# Patient Record
Sex: Female | Born: 1960 | Race: Black or African American | Hispanic: No | Marital: Married | Smoking: Never smoker
Health system: Southern US, Community
[De-identification: ages and names within clinical notes are randomized; demographics above are authoritative.]

## PROBLEM LIST (undated history)

## (undated) DIAGNOSIS — R112 Nausea with vomiting, unspecified: Secondary | ICD-10-CM

## (undated) DIAGNOSIS — I10 Essential (primary) hypertension: Secondary | ICD-10-CM

## (undated) DIAGNOSIS — E669 Obesity, unspecified: Secondary | ICD-10-CM

## (undated) DIAGNOSIS — T4145XA Adverse effect of unspecified anesthetic, initial encounter: Secondary | ICD-10-CM

## (undated) DIAGNOSIS — E119 Type 2 diabetes mellitus without complications: Secondary | ICD-10-CM

## (undated) DIAGNOSIS — T8859XA Other complications of anesthesia, initial encounter: Secondary | ICD-10-CM

## (undated) DIAGNOSIS — E079 Disorder of thyroid, unspecified: Secondary | ICD-10-CM

## (undated) DIAGNOSIS — Z9889 Other specified postprocedural states: Secondary | ICD-10-CM

## (undated) HISTORY — PX: BREAST REDUCTION SURGERY: SHX8

## (undated) HISTORY — DX: Obesity, unspecified: E66.9

## (undated) HISTORY — PX: REDUCTION MAMMAPLASTY: SUR839

## (undated) HISTORY — DX: Disorder of thyroid, unspecified: E07.9

## (undated) HISTORY — DX: Essential (primary) hypertension: I10

---
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
## 2000-09-21 ENCOUNTER — Ambulatory Visit (HOSPITAL_COMMUNITY): Admission: RE | Admit: 2000-09-21 | Discharge: 2000-09-21 | Payer: Self-pay | Admitting: Gastroenterology

## 2003-12-20 ENCOUNTER — Other Ambulatory Visit: Admission: RE | Admit: 2003-12-20 | Discharge: 2003-12-20 | Payer: Self-pay | Admitting: Family Medicine

## 2005-03-25 ENCOUNTER — Other Ambulatory Visit: Admission: RE | Admit: 2005-03-25 | Discharge: 2005-03-25 | Payer: Self-pay | Admitting: *Deleted

## 2007-03-11 ENCOUNTER — Other Ambulatory Visit: Admission: RE | Admit: 2007-03-11 | Discharge: 2007-03-11 | Payer: Self-pay | Admitting: Family Medicine

## 2009-05-24 ENCOUNTER — Encounter: Admission: RE | Admit: 2009-05-24 | Discharge: 2009-05-24 | Payer: Self-pay | Admitting: Family Medicine

## 2009-05-28 ENCOUNTER — Other Ambulatory Visit: Admission: RE | Admit: 2009-05-28 | Discharge: 2009-05-28 | Payer: Self-pay | Admitting: Family Medicine

## 2010-06-18 ENCOUNTER — Other Ambulatory Visit: Payer: Self-pay | Admitting: Family Medicine

## 2010-06-18 DIAGNOSIS — Z1231 Encounter for screening mammogram for malignant neoplasm of breast: Secondary | ICD-10-CM

## 2010-06-18 DIAGNOSIS — Z1239 Encounter for other screening for malignant neoplasm of breast: Secondary | ICD-10-CM

## 2010-06-19 ENCOUNTER — Encounter: Payer: Self-pay | Admitting: Family Medicine

## 2010-06-26 ENCOUNTER — Ambulatory Visit
Admission: RE | Admit: 2010-06-26 | Discharge: 2010-06-26 | Disposition: A | Discharge status: Home / Self Care | Payer: BC Managed Care – PPO | Source: Ambulatory Visit | Attending: Family Medicine | Admitting: Family Medicine

## 2010-06-26 DIAGNOSIS — Z1231 Encounter for screening mammogram for malignant neoplasm of breast: Secondary | ICD-10-CM

## 2010-10-04 NOTE — Procedures (Signed)
[facility]  Patient:    Andrea Delacruz, Andrea Delacruz              MRN: [id number] Proc. Date: 09/21/00 Adm. Date:  [id number] Attending:  Orland Mustard CC:         Chales Salmon. Abigail Miyamoto, M.D.   Procedure Report  PROCEDURE:  Colonoscopy.  MEDICATIONS:  Fentanyl 125 mcg, Versed 9 mg IV.  SCOPE:  Pediatric Olympus video colonoscope.  INDICATIONS:  A strong family history of colon cancer and increasing constipation.  DESCRIPTION OF PROCEDURE:  The procedure had been explained to the patient and consent obtained.  With the patient in the left lateral decubitus, the adult Olympus pediatric video colonoscope was inserted following a digital exam and advanced under direct visualization.  The prep was excellent.  Using abdominal pressure and position changes we were able to advance to the cecum.  The ileocecal valve was identified.  The right lower quadrant was transilluminated.  The scope was withdrawn, and cecum, ascending colon, hepatic flexure, transverse colon, splenic flexure, and descending colon were seen well.  There was no significant diverticular disease.  No polyps were seen.  The scope was withdrawn, and the patient tolerated the procedure well and was maintained on low-flow oxygen and pulse oximeter throughout the procedure with no obvious problem.  ASSESSMENT:  No evidence of colon polyps in this woman with a strong family history of colon cancer.  PLAN:  We will repeating the procedure in five years.  We will continue with Metamucil and Miralax for her constipation and see back in the office in three months. DD:  09/21/00 TD:  09/19/00 Job: [id number]

## 2011-05-26 ENCOUNTER — Other Ambulatory Visit: Payer: Self-pay | Admitting: Family Medicine

## 2011-05-26 DIAGNOSIS — Z1231 Encounter for screening mammogram for malignant neoplasm of breast: Secondary | ICD-10-CM

## 2011-06-19 ENCOUNTER — Other Ambulatory Visit (HOSPITAL_COMMUNITY)
Admission: RE | Admit: 2011-06-19 | Discharge: 2011-06-19 | Disposition: A | Discharge status: Home / Self Care | Payer: BC Managed Care – PPO | Source: Ambulatory Visit | Attending: Family Medicine | Admitting: Family Medicine

## 2011-06-19 ENCOUNTER — Other Ambulatory Visit: Payer: Self-pay | Admitting: Family Medicine

## 2011-06-19 DIAGNOSIS — Z01419 Encounter for gynecological examination (general) (routine) without abnormal findings: Secondary | ICD-10-CM | POA: Insufficient documentation

## 2011-07-07 ENCOUNTER — Ambulatory Visit
Admission: RE | Admit: 2011-07-07 | Discharge: 2011-07-07 | Disposition: A | Discharge status: Home / Self Care | Payer: BC Managed Care – PPO | Source: Ambulatory Visit | Attending: Family Medicine | Admitting: Family Medicine

## 2011-07-07 ENCOUNTER — Other Ambulatory Visit: Payer: Self-pay | Admitting: Family Medicine

## 2011-07-07 DIAGNOSIS — Z1231 Encounter for screening mammogram for malignant neoplasm of breast: Secondary | ICD-10-CM

## 2012-09-28 ENCOUNTER — Ambulatory Visit (INDEPENDENT_AMBULATORY_CARE_PROVIDER_SITE_OTHER): Payer: BC Managed Care – PPO | Admitting: Family Medicine

## 2012-09-28 ENCOUNTER — Encounter: Payer: Self-pay | Admitting: Family Medicine

## 2012-09-28 VITALS — BP 112/77 | HR 67 | Resp 14 | Ht 66.25 in | Wt 215.0 lb

## 2012-09-28 DIAGNOSIS — I10 Essential (primary) hypertension: Secondary | ICD-10-CM

## 2012-09-28 DIAGNOSIS — E669 Obesity, unspecified: Secondary | ICD-10-CM

## 2012-09-28 DIAGNOSIS — Z5181 Encounter for therapeutic drug level monitoring: Secondary | ICD-10-CM

## 2012-09-28 MED ORDER — CYANOCOBALAMIN 1000 MCG/ML IJ SOLN: INTRAMUSCULAR | Status: DC

## 2012-09-28 MED ORDER — CHORIONIC GONADOTROPIN 10000 UNITS IM SOLR: INTRAMUSCULAR | Status: DC

## 2012-09-28 MED ORDER — PHENDIMETRAZINE TARTRATE 35 MG PO TABS: 1.0000 | ORAL_TABLET | Freq: Three times a day (TID) | ORAL | Status: DC

## 2012-09-28 NOTE — Progress Notes (Signed)
  Subjective:    Patient ID: Andrea Delacruz, female    DOB: June 15, 1960, 52 y.o.   MRN: [id number]  HPI Andrea Delacruz is here today to establish care with our practice.  She was referred to [place] by a co-worker Misty Stanley Pegram).  She is interested on learning about our Step By Step Diet & Fitness Program.  She has tried numerous diet programs and has not been successful with them.  She feels that she needs to lose weight to improve her general health.  Review of Systems  Constitutional: Positive for fatigue and unexpected weight change. Negative for activity change and appetite change.  Respiratory: Negative for shortness of breath.   Cardiovascular: Negative for chest pain and palpitations.  Gastrointestinal: Negative.   Genitourinary: Negative.   Neurological: Negative.   Psychiatric/Behavioral: Negative.    Past Medical History  Diagnosis Date  . Hypertension   . Obesity   . Thyroid disease     Hyperactive then was treated with iodine.    Family History  Problem Relation Age of Onset  . Stroke Mother   . Diabetes Mother   . Diabetes Father   . Diabetes Sister   . Hypertension Sister   . Hypertension Maternal Aunt   . Cancer Maternal Uncle   . Diabetes Paternal Aunt   . Diabetes Paternal Uncle   . Cancer Maternal Grandmother    History   Social History Narrative   Marital Status:  Married Press photographer)    Children:  G2 Daughter (01) Recruitment consultant) Twins (Adam and Antiyonne)   Pets: Cat (01)    Living Situation: Lives with husband    Occupation:  School Principal -  Presenter, broadcasting    Education:  [employer] in Education    Tobacco Use/Exposure:  None    Alcohol Use:  Occasional   Drug Use:  None   Diet:  Regular   Exercise:  None   Hobbies: Reading              Objective:   Physical Exam  Constitutional: She appears well-nourished. No distress.  HENT:  Head: Normocephalic.  Eyes: No scleral icterus.  Neck: No thyromegaly present.  Cardiovascular: Normal rate, regular rhythm  and normal heart sounds.   Pulmonary/Chest: Effort normal and breath sounds normal.  Abdominal: There is no tenderness.  Musculoskeletal: She exhibits no edema and no tenderness.  Neurological: She is alert.  Skin: Skin is warm and dry.  Psychiatric: She has a normal mood and affect. Her behavior is normal. Judgment and thought content normal.      Assessment & Plan:

## 2012-09-28 NOTE — Patient Instructions (Addendum)
1)  HCG Diet - You will receive an emailed copy of "Pounds & Inches" to [email or web address].     2)  BP - Once you start the 500 calories, cut your BP meds in 1/2.  Make sure that Elease Hashimoto checks your BP next week because we may stop the medication all together.  If your BP gets too low just stop it.

## 2012-09-29 ENCOUNTER — Other Ambulatory Visit: Payer: Self-pay

## 2012-09-29 DIAGNOSIS — Z1231 Encounter for screening mammogram for malignant neoplasm of breast: Secondary | ICD-10-CM

## 2012-10-06 ENCOUNTER — Ambulatory Visit: Payer: BC Managed Care – PPO | Admitting: Family Medicine

## 2012-10-11 DIAGNOSIS — E669 Obesity, unspecified: Secondary | ICD-10-CM | POA: Insufficient documentation

## 2012-10-11 DIAGNOSIS — I10 Essential (primary) hypertension: Secondary | ICD-10-CM | POA: Insufficient documentation

## 2012-10-11 NOTE — Assessment & Plan Note (Signed)
She is to cut her dosage in 1/2 and have the nurse at work check her pressure.  If it is low, she will hold the lisinopril.

## 2012-10-13 ENCOUNTER — Encounter: Payer: Self-pay | Admitting: Family Medicine

## 2012-10-13 ENCOUNTER — Ambulatory Visit (INDEPENDENT_AMBULATORY_CARE_PROVIDER_SITE_OTHER): Payer: BC Managed Care – PPO | Admitting: Family Medicine

## 2012-10-13 DIAGNOSIS — E669 Obesity, unspecified: Secondary | ICD-10-CM

## 2012-10-13 NOTE — Progress Notes (Signed)
  Subjective:    Patient ID: Andrea Delacruz, female    DOB: 1960-09-30, 52 y.o.   MRN: [id number]  HPI  Andrea Delacruz is here today for a follow up of her weight loss. She has just completed her 2nd week of the "Step By Step"  Program.  She is taking Phendimetrazine without any problem. She has also been receiving HCG injections without difficulty.  She has not been following the 500 calorie diet as closely as she needs to.    Review of Systems  Constitutional: Negative for activity change, appetite change, fatigue and unexpected weight change.    Past Medical History  Diagnosis Date  . Hypertension   . Obesity   . Thyroid disease     Hyperactive then was treated with iodine.    Family History  Problem Relation Age of Onset  . Stroke Mother   . Diabetes Mother   . Diabetes Father   . Diabetes Sister   . Hypertension Sister   . Hypertension Maternal Aunt   . Cancer Maternal Uncle   . Diabetes Paternal Aunt   . Diabetes Paternal Uncle   . 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

## 2012-10-19 ENCOUNTER — Ambulatory Visit
Admission: RE | Admit: 2012-10-19 | Discharge: 2012-10-19 | Disposition: A | Discharge status: Home / Self Care | Payer: BC Managed Care – PPO | Source: Ambulatory Visit

## 2012-10-19 DIAGNOSIS — Z1231 Encounter for screening mammogram for malignant neoplasm of breast: Secondary | ICD-10-CM

## 2012-10-21 ENCOUNTER — Ambulatory Visit: Payer: BC Managed Care – PPO | Admitting: *Deleted

## 2012-10-21 VITALS — Wt 204.0 lb

## 2012-10-21 DIAGNOSIS — E669 Obesity, unspecified: Secondary | ICD-10-CM

## 2012-10-28 ENCOUNTER — Ambulatory Visit (INDEPENDENT_AMBULATORY_CARE_PROVIDER_SITE_OTHER): Payer: BC Managed Care – PPO | Admitting: Family Medicine

## 2012-10-28 VITALS — BP 115/75 | HR 70 | Wt 204.0 lb

## 2012-10-28 DIAGNOSIS — I10 Essential (primary) hypertension: Secondary | ICD-10-CM

## 2012-10-28 DIAGNOSIS — E669 Obesity, unspecified: Secondary | ICD-10-CM

## 2012-10-28 MED ORDER — PHENTERMINE HCL 37.5 MG PO TABS: 37.5000 mg | ORAL_TABLET | Freq: Every day | ORAL | Status: DC

## 2012-10-28 NOTE — Patient Instructions (Addendum)
Diet Following Bariatric Surgery The bariatric diet is designed to provide fluids and nourishment while promoting weight loss after bariatric surgery. The diet is divided into 3 stages. The rate of progression varies based on individual food tolerance. DIET FOLLOWING BARIATRIC SURGERY The diet following surgery is divided into 3 stages to allow a gradual adjustment. It is very important to the success of your surgery to:  Progress to each stage slowly.  Eat at set times.  Chew food well and stop eating when you are full.  Not drink liquids 30 minutes before and after meals. If you feel tightness or pressure in your chest, that means you are full. Wait 30 minutes before you try to eat again. STAGE 1 BARIATRIC DIET - ABOUT 2 WEEKS IN DURATION   The diet begins the day of surgery. It will last about 1 to 2 weeks after surgery. Your surgeon may have individual guidelines for you about specific foods or the progression of your diet. Follow your surgeon's guidelines.  If clear liquids are well-tolerated without vomiting, your caregiver will add a 4 oz to 6 oz high protein, low-calorie liquid supplement. You could add this to your meal plan 3 times daily. You will need at least 60 g to 80 g of protein daily or as determined by your Registered Dietitian.  Guidelines for choosing a protein supplement include:  At least 15 g of protein per 8 oz serving.  Less than 20 g total carbohydrate per 8 oz serving.  Less than 5 g fat per 8 oz serving.  Avoid carbonated beverages, caffeine, alcohol, and concentrated sweets such as sugar, cakes, and cookies.  Right after surgery, you may only be able to eat 3 to 4 tsp per meal. Your maximum volume should not exceed  to  cup total. Do not eat or drink more than 1 oz or 2 tbs every 15 minutes.  Take a chewable multivitamin and mineral supplement.  Drink at least 48 oz of fluid daily, which includes your protein supplement. Food and beverages from the  list below are allowed at set times (for example at 8 AM, 12 noon, or 5 PM):  Decaffeinated coffee or tea.  100% fruit juice.  Diet or sugar-free drinks.  Broth.  Blenderized soup.  Skim milk or lactose-free milk.  Sugar-free gelatin dessert or frozen ice pops.  Mashed potatoes.  Yogurt (artificially sweetened).  Sugar-free pudding.  Blended low-fat cottage cheese.  Unsweetened applesauce, grits, or hot wheat cereal. Four to six ounces of a liquid protein supplement from the list below is recommended for snacks at 10 AM, 2 PM, and 8 PM.  STAGE 2 BARIATRIC DIET (SOFT DIET) - ABOUT 4 WEEKS IN DURATION  About 2 weeks after surgery, your caregiver will progress your diet to this stage. Foods may need to be blended to the consistency of applesauce. Choose low-fat foods (less than 5 g of fat per serving) and avoid concentrated sweets and sugar (less than 10 g of sugar per serving). Meals should not exceed  to  cup total. This stage will last about 4 weeks. It is recommended that you meet with your dietitian at this stage to begin preparation for the last stage. This stage consists of 3 meals a day with a liquid protein supplement between meals twice daily. Do not drink liquids with foods. You must wait 30 minutes for the stomach pouch to empty before drinking. Chew food well. The food must be almost liquified before swallowing. Soft foods from the   list below can now be slowly added to your diet:  Soft fruit (soft canned fruit in light syrup or natural juice, banana, melon, peaches, pears, or strawberries).  Cooked vegetables.  Toast or crackers (becomes soft after chewing 20 times).  Hot wheat cereal.  Fish.  Eggs (scrambled, soft-boiled). STAGE 3 BARIATRIC DIET (REGULAR DIET) - ABOUT 6 to 8 WEEKS AFTER SURGERY About 6 to 8 weeks after surgery, you will be advanced to food that is regular in texture. This diet should include all food groups. The diet will continue to promote  weight loss. Meals should not exceed  to 1 cup total. Your dietitian will be available to assist you in meal planning and additional behavioral strategies to make this final stage a long-term success. Slowly add foods of regular consistency and remember:  Eat only at your chosen meal times.  Minimize drinking with meals. You should drink 30 minutes before eating. Do not start drinking again for about 2 hours after eating.  Chew food well. Take small bites.  Think about the portion size of a healthy frozen meal. You will be able to eat most of this.  Make sure your meal is balanced with starch, protein, fruits, and vegetables.  When you feel full, stop eating. Document Released: 11/09/2002 Document Revised: 07/28/2011 Document Reviewed: 08/02/2010 [employer] Patient Information 2014 [employer].  

## 2012-11-06 ENCOUNTER — Encounter: Payer: Self-pay | Admitting: Family Medicine

## 2012-11-06 NOTE — Assessment & Plan Note (Signed)
She is to work harder on following the HCG diet more closely.

## 2012-11-25 NOTE — Progress Notes (Signed)
  Subjective:    Patient ID: Andrea Delacruz, female    DOB: 1961/01/18, 52 y.o.   MRN: [id number]  HPI  Leetta is back for her final weigh in.  She has lost 11 lbs since starting on the HCG diet.  Her clothes are fitting better and she has more energy.  She wants to continue to lose weight.    Review of Systems  Constitutional: Positive for activity change and appetite change. Negative for fatigue and unexpected weight change (11 lb weight loss).  Respiratory: Negative for chest tightness and shortness of breath.   Cardiovascular: Negative for chest pain, palpitations and leg swelling.  Gastrointestinal: Negative for diarrhea and constipation.  Genitourinary: Negative for difficulty urinating.  Musculoskeletal: Negative for arthralgias.  Psychiatric/Behavioral: Negative for dysphoric mood. The patient is not nervous/anxious.        Objective:   Physical Exam  Constitutional: She appears well-nourished. No distress.  HENT:  Head: Normocephalic.  Eyes: No scleral icterus.  Neck: No thyromegaly present.  Cardiovascular: Normal rate, regular rhythm and normal heart sounds.   Pulmonary/Chest: Effort normal and breath sounds normal.  Abdominal: There is no tenderness.  Musculoskeletal: She exhibits no edema and no tenderness.  Neurological: She is alert.  Skin: Skin is warm and dry.  Psychiatric: She has a normal mood and affect. Her behavior is normal. Judgment and thought content normal.          Assessment & Plan:

## 2012-11-25 NOTE — Assessment & Plan Note (Signed)
She was given a prescription for Phentermine for Phase III of the HCG diet.

## 2012-11-25 NOTE — Assessment & Plan Note (Signed)
BP is perfect.

## 2013-03-18 ENCOUNTER — Telehealth: Payer: Self-pay | Admitting: Endocrinology

## 2013-03-18 NOTE — Telephone Encounter (Signed)
Pt says she got a message that she needs to sch appt w/ Dr. Lucianne Muss based on lab results. When does pt need to be seen? Sherri

## 2013-03-18 NOTE — Telephone Encounter (Signed)
ASAP, I think she no showed her last appt

## 2013-03-19 NOTE — Telephone Encounter (Signed)
Pt scheduled to come in on Tuesday 03/22/13. Pt is aware of appt date and time / Sherir S.

## 2013-03-22 ENCOUNTER — Encounter: Payer: Self-pay | Admitting: Endocrinology

## 2013-03-22 ENCOUNTER — Ambulatory Visit (INDEPENDENT_AMBULATORY_CARE_PROVIDER_SITE_OTHER): Payer: BC Managed Care – PPO | Admitting: Endocrinology

## 2013-03-22 VITALS — BP 110/62 | HR 99 | Temp 98.3°F | Resp 12 | Ht 67.0 in | Wt 214.3 lb

## 2013-03-22 DIAGNOSIS — E890 Postprocedural hypothyroidism: Secondary | ICD-10-CM

## 2013-03-22 MED ORDER — SYNTHROID 100 MCG PO TABS
100.0000 ug | ORAL_TABLET | Freq: Every day | ORAL | Status: DC
Start: 2013-03-22 — End: 2013-08-09

## 2013-03-22 NOTE — Progress Notes (Signed)
Andrea Delacruz  Reason for Appointment:  Hypothyroidism, followup visit   History of Present Illness:   The hypothyroidism was first diagnosed  about 20 years ago after her I-131 treatment for hyperthyroidism Complaints are reported by the patient now are some tendency to weight gain and muscle aching She does not complain of any unusual fatigue or cold sensitivity          The treatments that the patient has taken include brand name Synthroid. Over the last couple of years has required lower doses           Her last visit was several months ago and records are not available She does not think her dose was changed on her last visit Compliance with the medical regimen has been as prescribed with taking the tablet in the morning before breakfast.  Not clear why her TSH is relatively low at 0.16 now; this was done about 10 days ago at her PCP office  No results found for any previous visit.    Medication List       This list is accurate as of: 03/22/13 11:59 PM.  Always use your most recent med list.               amoxicillin 500 MG capsule  Commonly known as:  AMOXIL  500 mg.     cyanocobalamin 1000 MCG/ML injection  Commonly known as:  (VITAMIN B-12)  Mix with HCG     fluticasone 50 MCG/ACT nasal spray  Commonly known as:  FLONASE     hydrochlorothiazide 25 MG tablet  Commonly known as:  HYDRODIURIL     lisinopril 10 MG tablet  Commonly known as:  PRINIVIL,ZESTRIL  Take 10 mg by mouth daily.     SYNTHROID 100 MCG tablet  Generic drug:  levothyroxine  Take 1 tablet (100 mcg total) by mouth daily before breakfast.        Allergies: No Known Allergies  Past Medical History  Diagnosis Date  . Hypertension   . Obesity   . Thyroid disease     Hyperactive then was treated with iodine.     Past Surgical History  Procedure Laterality Date  . Breast reduction surgery  [id number]    Family History  Problem Relation Age of Onset  . Stroke Mother   . Diabetes  Mother   . Diabetes Father   . Diabetes Sister   . Hypertension Sister   . Hypertension Maternal Aunt   . Cancer Maternal Uncle   . Diabetes Paternal Aunt   . Diabetes Paternal Uncle   . Cancer Maternal Grandmother     Social History:  reports that she has never smoked. She does not have any smokeless tobacco history on file. She reports that she drinks alcohol. She reports that she does not use illicit drugs.  REVIEW Of SYSTEMS:  History of hypertension No history of diabetes   Examination:   BP 110/62  Pulse 99  Temp(Src) 98.3 F (36.8 C)  Resp 12  Ht 5\' 7"  (1.702 m)  Wt 214 lb 4.8 oz (97.206 kg)  BMI 33.56 kg/m2  SpO2 98%   GENERAL APPEARANCE: Alert And looks well.    FACE: No puffiness of face   Pulse 80         NECK: no thyromegaly.          NEUROLOGIC EXAM: DTRs 2+ at biceps with normal relaxation.    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

## 2013-03-22 NOTE — Patient Instructions (Signed)
Synthroid 100ug daily

## 2013-06-10 ENCOUNTER — Other Ambulatory Visit (INDEPENDENT_AMBULATORY_CARE_PROVIDER_SITE_OTHER): Payer: BC Managed Care – PPO

## 2013-06-10 DIAGNOSIS — E890 Postprocedural hypothyroidism: Secondary | ICD-10-CM

## 2013-06-10 LAB — TSH: TSH: 2.29 u[IU]/mL (ref 0.35–5.50)

## 2013-06-10 LAB — T4, FREE: Free T4: 0.9 ng/dL (ref 0.60–1.60)

## 2013-06-13 ENCOUNTER — Ambulatory Visit (INDEPENDENT_AMBULATORY_CARE_PROVIDER_SITE_OTHER): Payer: BC Managed Care – PPO | Admitting: Endocrinology

## 2013-06-13 ENCOUNTER — Encounter: Payer: Self-pay | Admitting: Endocrinology

## 2013-06-13 VITALS — BP 118/72 | HR 81 | Temp 98.1°F | Resp 14 | Ht 67.0 in | Wt 217.9 lb

## 2013-06-13 DIAGNOSIS — E890 Postprocedural hypothyroidism: Secondary | ICD-10-CM

## 2013-06-13 NOTE — Progress Notes (Signed)
Andrea Delacruz  Reason for Appointment:  Hypothyroidism, followup visit   History of Present Illness:   Her hypothyroidism was first diagnosed  about 20 years ago after her I-131 treatment for hyperthyroidism The therapy that the patient has taken include brand name Synthroid. Over the last couple of years has required lower doses  On her last visit because of a TSH of 0.16 her dose was reduced to 100 mcg    Complaints are reported by the patient now are some tendency to weight gain  She does not complain of any unusual fatigue or cold sensitivity     She still has some muscle aches off and on      Compliance with the medical regimen has been as prescribed with taking the tablet in the morning before breakfast.  Labs:  Appointment on 06/10/2013  Component Date Value Range Status  . Free T4 06/10/2013 0.90  0.60 - 1.60 ng/dL Final  . TSH 16/10/960401/23/2015 2.29  0.35 - 5.50 uIU/mL Final      Medication List       This list is accurate as of: 06/13/13  8:26 AM.  Always use your most recent med list.               hydrochlorothiazide 25 MG tablet  Commonly known as:  HYDRODIURIL     lisinopril 10 MG tablet  Commonly known as:  PRINIVIL,ZESTRIL  Take 10 mg by mouth daily.     SYNTHROID 100 MCG tablet  Generic drug:  levothyroxine  Take 1 tablet (100 mcg total) by mouth daily before breakfast.        Allergies: No Known Allergies  Past Medical History  Diagnosis Date  . Hypertension   . Obesity   . Thyroid disease     Hyperactive then was treated with iodine.     Past Surgical History  Procedure Laterality Date  . Breast reduction surgery  [id number]    Family History  Problem Relation Age of Onset  . Stroke Mother   . Diabetes Mother   . Diabetes Father   . Diabetes Sister   . Hypertension Sister   . Hypertension Maternal Aunt   . Cancer Maternal Uncle   . Diabetes Paternal Aunt   . Diabetes Paternal Uncle   . Cancer Maternal Grandmother     Social  History:  reports that she has never smoked. She does not have any smokeless tobacco history on file. She reports that she drinks alcohol. She reports that she does not use illicit drugs.  REVIEW Of SYSTEMS:  History of hypertension on a 2 drug regimen, followed by PCP No history of palpitations  No history of diabetes   Examination:   BP 118/72  Pulse 81  Temp(Src) 98.1 F (36.7 C)  Resp 14  Ht 5\' 7"  (1.702 m)  Wt 217 lb 14.4 oz (98.839 kg)  BMI 34.12 kg/m2  SpO2 94%   GENERAL APPEARANCE: Alert And looks well.    FACE: No puffiness of  hands   heart rate 78 regular       NEUROLOGIC EXAM: DTRs  somewhat difficult to elicit but appear to have  normal relaxation.    Assessments   Hypothyroidism, post ablative  and long-standing She is requiring relatively low doses of medication for her supplement now However TSH is in the desired range currently with 100 mcg and she can continue this   [facility] HospitalKUMAR,Andrea Delacruz 06/13/2013, 8:26 AM

## 2013-08-09 ENCOUNTER — Other Ambulatory Visit: Payer: Self-pay | Admitting: *Deleted

## 2013-08-09 MED ORDER — SYNTHROID 100 MCG PO TABS: 100.0000 ug | ORAL_TABLET | Freq: Every day | ORAL | Status: DC

## 2013-10-11 ENCOUNTER — Other Ambulatory Visit: Payer: Self-pay

## 2013-10-11 DIAGNOSIS — Z1231 Encounter for screening mammogram for malignant neoplasm of breast: Secondary | ICD-10-CM

## 2013-11-08 ENCOUNTER — Ambulatory Visit
Admission: RE | Admit: 2013-11-08 | Discharge: 2013-11-08 | Disposition: A | Discharge status: Home / Self Care | Payer: BC Managed Care – PPO | Source: Ambulatory Visit

## 2013-11-08 DIAGNOSIS — Z1231 Encounter for screening mammogram for malignant neoplasm of breast: Secondary | ICD-10-CM

## 2013-12-12 ENCOUNTER — Other Ambulatory Visit (INDEPENDENT_AMBULATORY_CARE_PROVIDER_SITE_OTHER): Payer: BC Managed Care – PPO

## 2013-12-12 DIAGNOSIS — E890 Postprocedural hypothyroidism: Secondary | ICD-10-CM

## 2013-12-12 LAB — T4, FREE: Free T4: 2.35 ng/dL — ABNORMAL HIGH (ref 0.60–1.60)

## 2013-12-12 LAB — TSH: TSH: 0.43 u[IU]/mL (ref 0.35–4.50)

## 2013-12-16 ENCOUNTER — Encounter: Payer: Self-pay | Admitting: Endocrinology

## 2013-12-16 ENCOUNTER — Ambulatory Visit (INDEPENDENT_AMBULATORY_CARE_PROVIDER_SITE_OTHER): Payer: BC Managed Care – PPO | Admitting: Endocrinology

## 2013-12-16 VITALS — BP 114/84 | HR 81 | Temp 98.0°F | Resp 16 | Ht 67.0 in | Wt 210.0 lb

## 2013-12-16 DIAGNOSIS — E890 Postprocedural hypothyroidism: Secondary | ICD-10-CM

## 2013-12-16 NOTE — Progress Notes (Signed)
Andrea Delacruz   Reason for Appointment:  Hypothyroidism, followup visit   History of Present Illness:   Her hypothyroidism was first diagnosed  about 20 years ago after her I-131 treatment for hyperthyroidism The therapy that the patient has taken include brand name Synthroid. Over the last couple of years has required lower doses  In 11/14 because of a TSH of 0.16 her dose was reduced to 100 mcg    Complaints are reported by the patient now are none She does not complain of any unusual fatigue or cold sensitivity and she has been able to lose weight    Compliance with the medical regimen has been as prescribed with taking the tablet in the morning before breakfast. Has not taken any new medications or OTC supplements except B vitamins  Labs:  Appointment on 12/12/2013  Component Date Value Ref Range Status  . TSH 12/12/2013 0.43  0.35 - 4.50 uIU/mL Final  . Free T4 12/12/2013 2.35* 0.60 - 1.60 ng/dL Final      Medication List       This list is accurate as of: 12/16/13  8:34 AM.  Always use your most recent med list.               hydrochlorothiazide 25 MG tablet  Commonly known as:  HYDRODIURIL     lisinopril 10 MG tablet  Commonly known as:  PRINIVIL,ZESTRIL  Take 10 mg by mouth daily.     SYNTHROID 100 MCG tablet  Generic drug:  levothyroxine  Take 1 tablet (100 mcg total) by mouth daily before breakfast.        Allergies: No Known Allergies  Past Medical History  Diagnosis Date  . Hypertension   . Obesity   . Thyroid disease     Hyperactive then was treated with iodine.     Past Surgical History  Procedure Laterality Date  . Breast reduction surgery  [id number]    Family History  Problem Relation Age of Onset  . Stroke Mother   . Diabetes Mother   . Diabetes Father   . Diabetes Sister   . Hypertension Sister   . Hypertension Maternal Aunt   . Cancer Maternal Uncle   . Diabetes Paternal Aunt   . Diabetes Paternal Uncle   . Cancer Maternal  Grandmother     Social History:  reports that she has never smoked. She does not have any smokeless tobacco history on file. She reports that she drinks alcohol. She reports that she does not use illicit drugs.  REVIEW Of SYSTEMS:  History of hypertension on a 2 drug regimen, followed by PCP  No history of diabetes   Examination:   BP 114/84  Pulse 81  Temp(Src) 98 F (36.7 C)  Resp 16  Ht 5\' 7"  (1.702 m)  Wt 210 lb (95.255 kg)  BMI 32.88 kg/m2  SpO2 96%   GENERAL APPEARANCE: she  looks well.   No puffiness of  hands        NEUROLOGIC EXAM: DTRs  somewhat difficult to elicit, has normal  relaxation.    Assessment/Plan   Hypothyroidism, post ablative  and long-standing She is requiring relatively low doses of medication for her supplement  TSH is low normal and she can reduce her dose by half tablet a week   Followup in 6 months  Andrea Delacruz 12/16/2013, 8:34 AM

## 2013-12-16 NOTE — Patient Instructions (Signed)
1/2 tab on Sundays and 1 on other days

## 2014-05-04 ENCOUNTER — Other Ambulatory Visit: Payer: Self-pay | Admitting: Endocrinology

## 2014-06-13 ENCOUNTER — Other Ambulatory Visit (INDEPENDENT_AMBULATORY_CARE_PROVIDER_SITE_OTHER): Payer: BC Managed Care – PPO

## 2014-06-13 DIAGNOSIS — E890 Postprocedural hypothyroidism: Secondary | ICD-10-CM

## 2014-06-13 LAB — TSH: TSH: 5.13 u[IU]/mL — AB (ref 0.35–4.50)

## 2014-06-14 ENCOUNTER — Other Ambulatory Visit: Payer: BC Managed Care – PPO

## 2014-06-20 ENCOUNTER — Ambulatory Visit: Payer: BC Managed Care – PPO | Admitting: Endocrinology

## 2014-06-21 ENCOUNTER — Ambulatory Visit (INDEPENDENT_AMBULATORY_CARE_PROVIDER_SITE_OTHER): Payer: BC Managed Care – PPO | Admitting: Endocrinology

## 2014-06-21 ENCOUNTER — Other Ambulatory Visit (HOSPITAL_COMMUNITY)
Admission: RE | Admit: 2014-06-21 | Discharge: 2014-06-21 | Disposition: A | Discharge status: Home / Self Care | Payer: BC Managed Care – PPO | Source: Ambulatory Visit | Attending: Obstetrics & Gynecology | Admitting: Obstetrics & Gynecology

## 2014-06-21 ENCOUNTER — Encounter: Payer: Self-pay | Admitting: Endocrinology

## 2014-06-21 ENCOUNTER — Other Ambulatory Visit: Payer: Self-pay | Admitting: Obstetrics & Gynecology

## 2014-06-21 VITALS — BP 118/76 | HR 100 | Temp 98.4°F | Ht 67.0 in | Wt 220.0 lb

## 2014-06-21 DIAGNOSIS — Z01419 Encounter for gynecological examination (general) (routine) without abnormal findings: Secondary | ICD-10-CM | POA: Insufficient documentation

## 2014-06-21 DIAGNOSIS — E890 Postprocedural hypothyroidism: Secondary | ICD-10-CM

## 2014-06-21 DIAGNOSIS — Z1151 Encounter for screening for human papillomavirus (HPV): Secondary | ICD-10-CM | POA: Insufficient documentation

## 2014-06-21 DIAGNOSIS — R87810 Cervical high risk human papillomavirus (HPV) DNA test positive: Secondary | ICD-10-CM | POA: Diagnosis present

## 2014-06-21 MED ORDER — SYNTHROID 112 MCG PO TABS: 112.0000 ug | ORAL_TABLET | Freq: Every day | ORAL | Status: DC

## 2014-06-21 NOTE — Progress Notes (Signed)
Pre visit review using our clinic review tool, if applicable. No additional management support is needed unless otherwise documented below in the visit note. 

## 2014-06-21 NOTE — Progress Notes (Signed)
Andrea AmisAngella G Heeren 54 y.o.             Reason for Appointment:  Hypothyroidism, followup visit   History of Present Illness:   Her hypothyroidism was first diagnosed  about 20 years ago after her I-131 treatment for hyperthyroidism The therapy that the patient has taken include brand name Synthroid.  In 11/14 because of a TSH of 0.16 her dose was reduced to 100 mcg    Complaints are reported by the patient now are fatigue for about 3 months.  This is more noticeable recently. She does not have any unusual cold sensitivity. She has gained 10 pounds although this is partly from her not exercising because of fatigue. No constipation or hoarseness but she does think she has thinning of her hair    Compliance with the medical regimen has been as prescribed with taking the tablet in the morning before breakfast. She was told to take 6-1/2 tablets a week on her last visit but she is doing conflicting answers of whether she is doing this or not.  In 7/15 and her TSH was low normal at 0.43 It is now mildly increased  Has not taken any new medications or OTC supplements, is on prescription vitamin D2 weekly from PCP  Labs:  Lab Results  Component Value Date   TSH 5.13* 06/13/2014   TSH 0.43 12/12/2013   TSH 2.29 06/10/2013   FREET4 2.35* 12/12/2013   FREET4 0.90 06/10/2013       Medication List       This list is accurate as of: 06/21/14  4:34 PM.  Always use your most recent med list.               hydrochlorothiazide 25 MG tablet  Commonly known as:  HYDRODIURIL     lisinopril 10 MG tablet  Commonly known as:  PRINIVIL,ZESTRIL  Take 10 mg by mouth daily.     SYNTHROID 100 MCG tablet  Generic drug:  levothyroxine  TAKE 1 TABLET (100 MCG TOTAL) BY MOUTH DAILY BEFORE BREAKFAST.        Allergies: No Known Allergies  Past Medical History  Diagnosis Date  . Hypertension   . Obesity   . Thyroid disease     Hyperactive then was treated with iodine.     Past  Surgical History  Procedure Laterality Date  . Breast reduction surgery  [id number]    Family History  Problem Relation Age of Onset  . Stroke Mother   . Diabetes Mother   . Diabetes Father   . Diabetes Sister   . Hypertension Sister   . Hypertension Maternal Aunt   . Cancer Maternal Uncle   . Diabetes Paternal Aunt   . Diabetes Paternal Uncle   . Cancer Maternal Grandmother     Social History:  reports that she has never smoked. She does not have any smokeless tobacco history on file. She reports that she drinks alcohol. She reports that she does not use illicit drugs.  REVIEW Of SYSTEMS:  Wt Readings from Last 3 Encounters:  06/21/14 220 lb (99.791 kg)  12/16/13 210 lb (95.255 kg)  06/13/13 217 lb 14.4 oz (98.839 kg)   History of hypertension on a 2 drug regimen, followed by PCP  No history of diabetes  Muscle aches present.   Examination:   BP 118/76 mmHg  Pulse 100  Temp(Src) 98.4 F (36.9 C) (Oral)  Ht 5\' 7"  (1.702 m)  Wt 220 lb (99.791 kg)  BMI  34.45 kg/m2  SpO2 98%   GENERAL APPEARANCE: she  looks well.   No puffiness of  hands or eyes        NEUROLOGIC EXAM:  biceps reflexes: she does have normal  relaxation. No peripheral edema  Skin looks normal    Assessment/Plan   Hypothyroidism, post ablative  and long-standing. Although patient is not giving inaccurate history about her medication regimen she does need a higher dose not because of TSH of 5.1 and symptoms of fatigue Will simplify her regimen and have her take 112 g daily She'll continue brand name medication Discussed interacting substances to avoid  Followup in 2 months  Mohd. Derflinger 06/21/2014, 4:34 PM

## 2014-06-23 LAB — CYTOLOGY - PAP

## 2014-07-07 ENCOUNTER — Telehealth: Payer: Self-pay | Admitting: Endocrinology

## 2014-07-07 NOTE — Telephone Encounter (Signed)
Labs faxed

## 2014-07-07 NOTE — Telephone Encounter (Signed)
Pt newds last thyroid level fax to Dr. Hall office ASAP fax nuMargo Ayember is [phone]  Thanks Delice Bisonara

## 2014-08-15 ENCOUNTER — Other Ambulatory Visit (INDEPENDENT_AMBULATORY_CARE_PROVIDER_SITE_OTHER): Payer: BC Managed Care – PPO

## 2014-08-15 DIAGNOSIS — E890 Postprocedural hypothyroidism: Secondary | ICD-10-CM | POA: Diagnosis not present

## 2014-08-15 LAB — TSH: TSH: 0.63 u[IU]/mL (ref 0.35–4.50)

## 2014-08-15 LAB — T4, FREE: FREE T4: 1.15 ng/dL (ref 0.60–1.60)

## 2014-08-17 ENCOUNTER — Ambulatory Visit (INDEPENDENT_AMBULATORY_CARE_PROVIDER_SITE_OTHER): Payer: BC Managed Care – PPO | Admitting: Endocrinology

## 2014-08-17 ENCOUNTER — Encounter: Payer: Self-pay | Admitting: Endocrinology

## 2014-08-17 ENCOUNTER — Ambulatory Visit: Payer: BC Managed Care – PPO | Admitting: Endocrinology

## 2014-08-17 VITALS — BP 124/78 | HR 91 | Temp 98.3°F | Resp 16 | Ht 67.0 in | Wt 219.0 lb

## 2014-08-17 DIAGNOSIS — E890 Postprocedural hypothyroidism: Secondary | ICD-10-CM

## 2014-08-17 NOTE — Progress Notes (Signed)
Andrea Delacruz 53 y.o.             Reason for Appointment:  Hypothyroidism, followup visit   History of Present Illness:   Her hypothyroidism was first diagnosed  about 20 years ago after her I-131 treatment for hyperthyroidism The therapy that the patient has taken include brand name Synthroid.  In 11/14 because of a TSH of 0.16 her dose was reduced to 100 mcg  In 7/15 her TSH was low normal at 0.43  but in 1/16 her TSH had increased further to 5.1 At that time she was starting to feel more fatigue for the previous 3 months and also some hair loss  She was switched to 112 g once a day for simplicity and she has been taking this fairly regularly in the morning She thinks her energy level is improved  Has not taken any new medications or OTC supplements, is on prescription vitamin D2 weekly from PCP  Labs:  Lab Results  Component Value Date   TSH 0.63 08/15/2014   TSH 5.13* 06/13/2014   TSH 0.43 12/12/2013   FREET4 1.15 08/15/2014   FREET4 2.35* 12/12/2013   FREET4 0.90 06/10/2013       Medication List       This list is accurate as of: 08/17/14  1:22 PM.  Always use your most recent med list.               hydrochlorothiazide 25 MG tablet  Commonly known as:  HYDRODIURIL     lisinopril 10 MG tablet  Commonly known as:  PRINIVIL,ZESTRIL  Take 10 mg by mouth daily.     SYNTHROID 100 MCG tablet  Generic drug:  levothyroxine     vitamin B-12 1000 MCG tablet  Commonly known as:  CYANOCOBALAMIN  Take 1,000 mcg by mouth daily.        Allergies: No Known Allergies  Past Medical History  Diagnosis Date  . Hypertension   . Obesity   . Thyroid disease     Hyperactive then was treated with iodine.     Past Surgical History  Procedure Laterality Date  . Breast reduction surgery  [id number]    Family History  Problem Relation Age of Onset  . Stroke Mother   . Diabetes Mother   . Diabetes Father   . Diabetes Sister   . Hypertension Sister   .  Hypertension Maternal Aunt   . Cancer Maternal Uncle   . Diabetes Paternal Aunt   . Diabetes Paternal Uncle   . Cancer Maternal Grandmother     Social History:  reports that she has never smoked. She does not have any smokeless tobacco history on file. She reports that she drinks alcohol. She reports that she does not use illicit drugs.  REVIEW Of SYSTEMS:  Weight range:  Wt Readings from Last 3 Encounters:  08/17/14 219 lb (99.338 kg)  06/21/14 220 lb (99.791 kg)  12/16/13 210 lb (95.255 kg)   History of hypertension on a 2 drug regimen, followed by PCP  No history of diabetes  She is taking treatments from dermatologist for hair loss   Examination:   BP 124/78 mmHg  Pulse 91  Temp(Src) 98.3 F (36.8 C)  Resp 16  Ht  (1.702 m)  Wt 219 lb (99.338 kg)  BMI 34.29 kg/m2  SpO2 98%  She looks well.   No puffiness of  hands or eyes    biceps reflexes: These have normal  relaxation. No  peripheral edema  Skin looks normal    Assessment/Plan   Hypothyroidism, post ablative  and long-standing. Recently she has been feeling better with increasing her dose slightly Objectively looks euthyroid  Now her TSH is quite normal with taking 112 g of brand name Synthroid daily. She is fairly compliant with her medication and taking it in the morning as directed She will follow-up in another 6 months to ensure stability  Hair loss: May not be related to hypothyroidism as her thyroid levels have been usually close to normal  Andrea Delacruz 08/17/2014, 1:22 PM

## 2014-08-18 ENCOUNTER — Ambulatory Visit: Payer: BC Managed Care – PPO | Admitting: Endocrinology

## 2014-11-03 ENCOUNTER — Other Ambulatory Visit: Payer: Self-pay | Admitting: Endocrinology

## 2014-12-25 ENCOUNTER — Other Ambulatory Visit: Payer: Self-pay

## 2014-12-25 DIAGNOSIS — Z1231 Encounter for screening mammogram for malignant neoplasm of breast: Secondary | ICD-10-CM

## 2015-01-31 ENCOUNTER — Ambulatory Visit
Admission: RE | Admit: 2015-01-31 | Discharge: 2015-01-31 | Disposition: A | Discharge status: Home / Self Care | Payer: BC Managed Care – PPO | Source: Ambulatory Visit

## 2015-01-31 DIAGNOSIS — Z1231 Encounter for screening mammogram for malignant neoplasm of breast: Secondary | ICD-10-CM

## 2015-02-13 ENCOUNTER — Other Ambulatory Visit: Payer: BC Managed Care – PPO

## 2015-02-16 ENCOUNTER — Ambulatory Visit: Payer: BC Managed Care – PPO | Admitting: Endocrinology

## 2015-03-07 ENCOUNTER — Telehealth: Payer: Self-pay | Admitting: Endocrinology

## 2015-03-07 ENCOUNTER — Ambulatory Visit (INDEPENDENT_AMBULATORY_CARE_PROVIDER_SITE_OTHER): Payer: BC Managed Care – PPO | Admitting: Endocrinology

## 2015-03-07 ENCOUNTER — Encounter: Payer: Self-pay | Admitting: Endocrinology

## 2015-03-07 VITALS — BP 122/82 | HR 87 | Temp 98.3°F | Resp 14 | Ht 67.0 in | Wt 212.4 lb

## 2015-03-07 DIAGNOSIS — E890 Postprocedural hypothyroidism: Secondary | ICD-10-CM

## 2015-03-07 LAB — TSH: TSH: 3.4 u[IU]/mL (ref 0.35–4.50)

## 2015-03-07 NOTE — Telephone Encounter (Signed)
Patient stated that she will make and appointment once her blood work come back.

## 2015-03-07 NOTE — Progress Notes (Signed)
Quick Note:  Please let patient know that the lab result is normal and no action needed ______ 

## 2015-03-07 NOTE — Progress Notes (Signed)
Andrea Delacruz 54 y.o.             Reason for Appointment:  Hypothyroidism, followup visit   History of Present Illness:   Her hypothyroidism was first diagnosed  about 20 years ago after her I-131 treatment for hyperthyroidism The therapy that the patient has taken include brand name Synthroid.  In 11/14 because of a TSH of 0.16 her dose was reduced to 100 mcg  In 7/15 her TSH was low normal at 0.43  but in 1/16 her TSH had increased further to 5.1 At that time she was starting to feel more fatigue for the previous 3 months and also some hair loss  She was switched to 112 g once a day in 1/16 and she has been taking this fairly regularly in the morning She thinks her energy level is fairly good now and does not have any  unusual fatigue. She has issues with her loss which are unrelated. No unusual weight gain or dry skin.  Has not taken any new medications or OTC supplements, is on prescription vitamin D2 weekly from PCP  Labs:  Lab Results  Component Value Date   TSH 0.63 08/15/2014   TSH 5.13* 06/13/2014   TSH 0.43 12/12/2013   FREET4 1.15 08/15/2014   FREET4 2.35* 12/12/2013   FREET4 0.90 06/10/2013       Medication List       This list is accurate as of: 03/07/15  8:49 AM.  Always use your most recent med list.               hydrochlorothiazide 25 MG tablet  Commonly known as:  HYDRODIURIL     lisinopril 10 MG tablet  Commonly known as:  PRINIVIL,ZESTRIL  Take 10 mg by mouth daily.     SYNTHROID 112 MCG tablet  Generic drug:  levothyroxine  TAKE 1 TABLET (112 MCG TOTAL) BY MOUTH DAILY BEFORE BREAKFAST.     vitamin B-12 1000 MCG tablet  Commonly known as:  CYANOCOBALAMIN  Take 1,000 mcg by mouth daily.        Allergies: No Known Allergies  Past Medical History  Diagnosis Date  . Hypertension   . Obesity   . Thyroid disease     Hyperactive then was treated with iodine.     Past Surgical History  Procedure Laterality Date  . Breast  reduction surgery  [id number]    Family History  Problem Relation Age of Onset  . Stroke Mother   . Diabetes Mother   . Diabetes Father   . Diabetes Sister   . Hypertension Sister   . Hypertension Maternal Aunt   . Cancer Maternal Uncle   . Diabetes Paternal Aunt   . Diabetes Paternal Uncle   . Cancer Maternal Grandmother     Social History:  reports that she has never smoked. She does not have any smokeless tobacco history on file. She reports that she drinks alcohol. She reports that she does not use illicit drugs.  REVIEW Of SYSTEMS:  Weight range:  Wt Readings from Last 3 Encounters:  03/07/15 212 lb 6.4 oz (96.344 kg)  08/17/14 219 lb (99.338 kg)  06/21/14 220 lb (99.791 kg)   History of hypertension on a 2 drug regimen, followed by PCP  She is taking treatments from dermatologist for hair loss   Examination:   BP 122/82 mmHg  Pulse 87  Temp(Src) 98.3 F (36.8 C)  Resp 14  Ht  (1.702 m)  Wt  212 lb 6.4 oz (96.344 kg)  BMI 33.26 kg/m2  SpO2 93%  She looks well.   No puffiness of  hands or eyes Thyroid not palpable. Biceps reflexes are normal  No peripheral edema  Skin  normal    Assessment/Plan   Hypothyroidism, post ablative  and long-standing. She is subjectively doing fairly well TSH is not available today Has been compliant with her medication consistently Objectively looks euthyroid  She will have her TSH checked today and we will decide on further doses.  If normal will see her back in 6 months  Andrea Delacruz 03/07/2015, 8:49 AM   Addendum TSH is normal, continue same dosage

## 2015-03-07 NOTE — Telephone Encounter (Signed)
noted 

## 2015-03-23 ENCOUNTER — Other Ambulatory Visit: Payer: Self-pay | Admitting: Endocrinology

## 2015-07-18 ENCOUNTER — Other Ambulatory Visit: Payer: Self-pay | Admitting: Obstetrics & Gynecology

## 2015-07-18 ENCOUNTER — Other Ambulatory Visit (HOSPITAL_COMMUNITY)
Admission: RE | Admit: 2015-07-18 | Discharge: 2015-07-18 | Disposition: A | Discharge status: Home / Self Care | Payer: BC Managed Care – PPO | Source: Ambulatory Visit | Attending: Obstetrics and Gynecology | Admitting: Obstetrics and Gynecology

## 2015-07-18 DIAGNOSIS — Z01419 Encounter for gynecological examination (general) (routine) without abnormal findings: Secondary | ICD-10-CM | POA: Diagnosis not present

## 2015-07-19 LAB — CYTOLOGY - PAP

## 2015-12-14 ENCOUNTER — Other Ambulatory Visit (INDEPENDENT_AMBULATORY_CARE_PROVIDER_SITE_OTHER): Payer: BC Managed Care – PPO

## 2015-12-14 ENCOUNTER — Other Ambulatory Visit: Payer: BC Managed Care – PPO

## 2015-12-14 DIAGNOSIS — E890 Postprocedural hypothyroidism: Secondary | ICD-10-CM | POA: Diagnosis not present

## 2015-12-14 LAB — T4, FREE: Free T4: 1.48 ng/dL (ref 0.60–1.60)

## 2015-12-14 LAB — TSH: TSH: 10.72 u[IU]/mL — ABNORMAL HIGH (ref 0.35–4.50)

## 2015-12-17 ENCOUNTER — Ambulatory Visit: Payer: BC Managed Care – PPO | Admitting: Endocrinology

## 2015-12-21 ENCOUNTER — Ambulatory Visit (INDEPENDENT_AMBULATORY_CARE_PROVIDER_SITE_OTHER): Payer: BC Managed Care – PPO | Admitting: Endocrinology

## 2015-12-21 ENCOUNTER — Encounter: Payer: Self-pay | Admitting: Endocrinology

## 2015-12-21 VITALS — BP 124/68 | HR 77 | Temp 98.2°F | Ht 67.0 in | Wt 217.1 lb

## 2015-12-21 DIAGNOSIS — E890 Postprocedural hypothyroidism: Secondary | ICD-10-CM

## 2015-12-21 MED ORDER — SYNTHROID 137 MCG PO TABS: 137.0000 ug | ORAL_TABLET | Freq: Every day | ORAL | 2 refills | Status: DC

## 2015-12-21 NOTE — Progress Notes (Signed)
Andrea Delacruz 55 y.o.             Reason for Appointment:  Hypothyroidism, followup visit   History of Present Illness:   Her hypothyroidism was first diagnosed  about 20 years ago after her I-131 treatment for hyperthyroidism The therapy that the patient has taken include brand name Synthroid.  In 11/14 because of a TSH of 0.16 her dose was reduced to 100 mcg  In 7/15 her TSH was low normal at 0.43  but in 1/16 her TSH had increased further to 5.1 At that time she was starting to feel more fatigue for the previous 3 months and also some hair loss  She was switched to 112 g once a day in 1/16 and she has been taking this fairly regularly in the morning  Recent history: She was told to come back in 6 months and she is back now after about 9 months for follow-up She thinks for about 3 months she has had a little more fatigue but mostly complaining of achiness No significant change in hair loss Has a rebound in her weight She is very compliant with her supplement and continues to take brand name Synthroid  Has not taken any new medications or OTC supplements  Wt Readings from Last 3 Encounters:  12/21/15 217 lb 2 oz (98.5 kg)  03/07/15 212 lb 6.4 oz (96.3 kg)  08/17/14 219 lb (99.3 kg)    Labs:  Lab Results  Component Value Date   TSH 10.72 (H) 12/14/2015   TSH 3.40 03/07/2015   TSH 0.63 08/15/2014   FREET4 1.48 12/14/2015   FREET4 1.15 08/15/2014   FREET4 2.35 (H) 12/12/2013       Medication List       Accurate as of 12/21/15  9:19 AM. Always use your most recent med list.          hydrochlorothiazide 25 MG tablet Commonly known as:  HYDRODIURIL   lisinopril 10 MG tablet Commonly known as:  PRINIVIL,ZESTRIL Take 10 mg by mouth daily.   SYNTHROID 112 MCG tablet Generic drug:  levothyroxine TAKE 1 TABLET (112 MCG TOTAL) BY MOUTH DAILY BEFORE BREAKFAST.   vitamin B-12 1000 MCG tablet Commonly known as:  CYANOCOBALAMIN Take 1,000 mcg by mouth daily.       Allergies: No Known Allergies  Past Medical History:  Diagnosis Date  . Hypertension   . Obesity   . Thyroid disease    Hyperactive then was treated with iodine.     Past Surgical History:  Procedure Laterality Date  . BREAST REDUCTION SURGERY  [id number]    Family History  Problem Relation Age of Onset  . Stroke Mother   . Diabetes Mother   . Diabetes Father   . Diabetes Sister   . Hypertension Sister   . Hypertension Maternal Aunt   . Cancer Maternal Uncle   . Diabetes Paternal Aunt   . Diabetes Paternal Uncle   . Cancer Maternal Grandmother     Social History:  reports that she has never smoked. She has never used smokeless tobacco. She reports that she drinks alcohol. She reports that she does not use drugs.  REVIEW Of SYSTEMS:  Weight range:  Wt Readings from Last 3 Encounters:  12/21/15 217 lb 2 oz (98.5 kg)  03/07/15 212 lb 6.4 oz (96.3 kg)  08/17/14 219 lb (99.3 kg)   History of hypertension on a 2 drug regimen, followed by PCP  She Has had  treatments from  dermatologist for hair loss   Examination:   BP 124/68 (BP Location: Right Arm, Patient Position: Sitting, Cuff Size: Normal)   Pulse 77   Temp 98.2 F (36.8 C) (Oral)   Ht  (1.702 m)   Wt 217 lb 2 oz (98.5 kg)   SpO2 96%   BMI 34.01 kg/m   She looks well.   No puffiness of  hands or eyes Thyroid not palpable. Biceps reflexes are normal But difficult to elicit No peripheral edema  Skin  normal    Assessment/Plan   Hypothyroidism, post ablative  and long-standing. Even though she has had relatively stable doses of levothyroxine she appears to have become more hypothyroid now She has had some fatigue and nonspecific achiness and weight gain  Has been compliant with her medication consistently Objectively looks euthyroid  Since her TSH is over 10 she will need to increase her dosage up to 137 g, continue brand name Synthroid, will need 90 day prescription on the next visit if  continued  She will have her TSH checked in another 6 weeks  Jaydin Boniface 12/21/2015, 9:19 AM

## 2016-01-25 ENCOUNTER — Other Ambulatory Visit (INDEPENDENT_AMBULATORY_CARE_PROVIDER_SITE_OTHER): Payer: BC Managed Care – PPO

## 2016-01-25 DIAGNOSIS — E890 Postprocedural hypothyroidism: Secondary | ICD-10-CM | POA: Diagnosis not present

## 2016-01-25 LAB — T4, FREE: FREE T4: 1.36 ng/dL (ref 0.60–1.60)

## 2016-01-25 LAB — TSH: TSH: 0.34 u[IU]/mL — AB (ref 0.35–4.50)

## 2016-01-30 ENCOUNTER — Ambulatory Visit (INDEPENDENT_AMBULATORY_CARE_PROVIDER_SITE_OTHER): Payer: BC Managed Care – PPO | Admitting: Endocrinology

## 2016-01-30 ENCOUNTER — Encounter: Payer: Self-pay | Admitting: Endocrinology

## 2016-01-30 VITALS — BP 122/80 | HR 86 | Ht 67.0 in | Wt 217.0 lb

## 2016-01-30 DIAGNOSIS — E890 Postprocedural hypothyroidism: Secondary | ICD-10-CM | POA: Diagnosis not present

## 2016-01-30 NOTE — Patient Instructions (Signed)
Take 6 1/2 pills per week 

## 2016-01-30 NOTE — Progress Notes (Signed)
Andrea Delacruz 55 y.o.             Reason for Appointment:  Hypothyroidism, followup visit   History of Present Illness:   Her hypothyroidism was first diagnosed  about 20 years ago after her I-131 treatment for hyperthyroidism The therapy that the patient has taken include brand name Synthroid.  In 11/14 because of a TSH of 0.16 her dose was reduced to 100 mcg  In 7/15 her TSH was low normal at 0.43  but in 1/16 her TSH had increased further to 5.1 At that time she was starting to feel more fatigue for the previous 3 months and also some hair loss  She was switched to 112 g once a day in 1/16 and she has been taking this fairly regularly in the morning  Recent history: On her last visit in 8/17 she was complaining about increasing fatigue and achiness for about 3 months and also 5 pound weight gain. Because of her TSH being about 10 her Synthroid dose was increased from 112-137 g. She feels much better with her energy level and not having any achiness.  Her weight is about the same  She is very compliant with her supplement and continues to take brand name Synthroid  Has not taken any new medications or OTC supplements  Wt Readings from Last 3 Encounters:  01/30/16 217 lb (98.4 kg)  12/21/15 217 lb 2 oz (98.5 kg)  03/07/15 212 lb 6.4 oz (96.3 kg)    Labs:  Lab Results  Component Value Date   TSH 0.34 (L) 01/25/2016   TSH 10.72 (H) 12/14/2015   TSH 3.40 03/07/2015   FREET4 1.36 01/25/2016   FREET4 1.48 12/14/2015   FREET4 1.15 08/15/2014       Medication List       Accurate as of 01/30/16  8:52 AM. Always use your most recent med list.          hydrochlorothiazide 25 MG tablet Commonly known as:  HYDRODIURIL   lisinopril 10 MG tablet Commonly known as:  PRINIVIL,ZESTRIL Take 10 mg by mouth daily.   SYNTHROID 137 MCG tablet Generic drug:  levothyroxine Take 1 tablet (137 mcg total) by mouth daily before breakfast.   vitamin B-12 1000 MCG  tablet Commonly known as:  CYANOCOBALAMIN Take 1,000 mcg by mouth daily.       Allergies: No Known Allergies  Past Medical History:  Diagnosis Date  . Hypertension   . Obesity   . Thyroid disease    Hyperactive then was treated with iodine.     Past Surgical History:  Procedure Laterality Date  . BREAST REDUCTION SURGERY  [id number]    Family History  Problem Relation Age of Onset  . Stroke Mother   . Diabetes Mother   . Diabetes Father   . Diabetes Sister   . Hypertension Sister   . Hypertension Maternal Aunt   . Cancer Maternal Uncle   . Diabetes Paternal Aunt   . Diabetes Paternal Uncle   . Cancer Maternal Grandmother     Social History:  reports that she has never smoked. She has never used smokeless tobacco. She reports that she drinks alcohol. She reports that she does not use drugs.  REVIEW Of SYSTEMS:  Weight range:  Wt Readings from Last 3 Encounters:  01/30/16 217 lb (98.4 kg)  12/21/15 217 lb 2 oz (98.5 kg)  03/07/15 212 lb 6.4 oz (96.3 kg)   History of hypertension on a 2 drug  regimen, followed by PCP  She Has had  treatments from dermatologist for hair loss   Examination:   BP 122/80   Pulse 86   Ht 5\' 7"  (1.702 m)   Wt 217 lb (98.4 kg)   LMP  (Approximate)   SpO2 99%   BMI 33.99 kg/m   She looks well.      Assessment/Plan   Hypothyroidism, post ablative  and long-standing.  She has had an increased requirement for levothyroxine this year. With increasing her dose to 137 g recently with baseline TSH 10.7 she is subjectively doing better and her TSH is improved. However TSH is slightly low at 0.34 with this regimen. She can continue the same dose for now but take only a half a tablet on Sundays  She will have her TSH checked in another 3 months  Andrea Delacruz 01/30/2016, 8:52 AM

## 2016-04-04 ENCOUNTER — Other Ambulatory Visit: Payer: Self-pay | Admitting: Endocrinology

## 2016-04-22 ENCOUNTER — Other Ambulatory Visit (INDEPENDENT_AMBULATORY_CARE_PROVIDER_SITE_OTHER): Payer: BC Managed Care – PPO

## 2016-04-22 DIAGNOSIS — E890 Postprocedural hypothyroidism: Secondary | ICD-10-CM | POA: Diagnosis not present

## 2016-04-22 LAB — TSH: TSH: 0.21 u[IU]/mL — AB (ref 0.35–4.50)

## 2016-04-22 LAB — T4, FREE: FREE T4: 1.45 ng/dL (ref 0.60–1.60)

## 2016-04-25 ENCOUNTER — Ambulatory Visit (INDEPENDENT_AMBULATORY_CARE_PROVIDER_SITE_OTHER): Payer: BC Managed Care – PPO | Admitting: Endocrinology

## 2016-04-25 ENCOUNTER — Encounter: Payer: Self-pay | Admitting: Endocrinology

## 2016-04-25 VITALS — BP 120/60 | HR 60 | Wt 225.0 lb

## 2016-04-25 DIAGNOSIS — E890 Postprocedural hypothyroidism: Secondary | ICD-10-CM

## 2016-04-25 MED ORDER — SYNTHROID 112 MCG PO TABS: 112.0000 ug | ORAL_TABLET | Freq: Every day | ORAL | 3 refills | Status: DC

## 2016-04-25 NOTE — Progress Notes (Signed)
Andrea AmisAngella G Gulyas 55 y.o.             Reason for Appointment:  Hypothyroidism, followup visit   History of Present Illness:   Her hypothyroidism was first diagnosed  about 20 years ago after her I-131 treatment for hyperthyroidism The therapy that the patient has taken include brand name Synthroid.  In 11/14 because of a TSH of 0.16 her dose was reduced to 100 mcg  In 7/15 her TSH was low normal at 0.43  but in 1/16 her TSH had increased further to 5.1 At that time she was starting to feel more fatigue for the previous 3 months and also some hair loss  She was switched to 112 g once a day in 1/16 and she has been taking this fairly regularly in the morning  Recent history: On her visit in 8/17 she was complaining about increasing fatigue and achiness for about 3 months and also 5 pound weight gain. Because of her TSH being about 10 her Synthroid dose was increased from 112 to 137 g. She felt much better with this  However on follow-up her TSH was 0.34 and she was told to take 6-1/2 tablets of her Synthroid 137 g a week She has done this and is also very compliant with her medication, continues to take brand-name  Overall she feels fairly good although she thinks she has had more stressed and has gained weight because of knee pain and less exercise  However her TSH is now low at 0.21  Has not taken any new medications or OTC supplements, continues to take biotin which she has for a year  Wt Readings from Last 3 Encounters:  04/25/16 225 lb (102.1 kg)  01/30/16 217 lb (98.4 kg)  12/21/15 217 lb 2 oz (98.5 kg)    Labs:  Lab Results  Component Value Date   TSH 0.21 (L) 04/22/2016   TSH 0.34 (L) 01/25/2016   TSH 10.72 (H) 12/14/2015   FREET4 1.45 04/22/2016   FREET4 1.36 01/25/2016   FREET4 1.48 12/14/2015       Medication List       Accurate as of 04/25/16  8:10 AM. Always use your most recent med list.          hydrochlorothiazide 25 MG tablet Commonly  known as:  HYDRODIURIL   lisinopril 10 MG tablet Commonly known as:  PRINIVIL,ZESTRIL Take 10 mg by mouth daily.   SYNTHROID 137 MCG tablet Generic drug:  levothyroxine TAKE 1 TABLET BY MOUTH DAILY BEFORE BREAKFAST.   vitamin B-12 1000 MCG tablet Commonly known as:  CYANOCOBALAMIN Take 1,000 mcg by mouth daily.       Allergies: No Known Allergies  Past Medical History:  Diagnosis Date  . Hypertension   . Obesity   . Thyroid disease    Hyperactive then was treated with iodine.     Past Surgical History:  Procedure Laterality Date  . BREAST REDUCTION SURGERY  [id number]    Family History  Problem Relation Age of Onset  . Stroke Mother   . Diabetes Mother   . Diabetes Father   . Diabetes Sister   . Hypertension Sister   . Hypertension Maternal Aunt   . Cancer Maternal Uncle   . Diabetes Paternal Aunt   . Diabetes Paternal Uncle   . Cancer Maternal Grandmother     Social History:  reports that she has never smoked. She has never used smokeless tobacco. She reports that she drinks alcohol. She reports  that she does not use drugs.  REVIEW Of SYSTEMS:  Weight range:  Wt Readings from Last 3 Encounters:  04/25/16 225 lb (102.1 kg)  01/30/16 217 lb (98.4 kg)  12/21/15 217 lb 2 oz (98.5 kg)   History of hypertension on a 2 drug regimen, followed by PCP    Examination:   Wt 225 lb (102.1 kg)   LMP  (Approximate)   BMI 35.24 kg/m   She looks well.  No tremor Biceps reflexes appear normal     Assessment/Plan   Hypothyroidism, post ablative  and long-standing.  She has had variable requirements for her thyroid supplement even with taking brand name Synthroid Although her TSH was previously normal with 137 last time it is now higher with the same dosage of 6-1/2 tablets a week which is the equivalent of about 127 per day Subjectively doing well Has gained some weight  She does take biotin but free T4 is not high  We will have her go down on her dose to  112 g, 7-1/2 tablets per week, she thinks she can follow these instructions Continue brand name Synthroid  She will have her TSH checked in another 6 weeks  Anyi Fels 04/25/2016, 8:10 AM

## 2016-05-02 ENCOUNTER — Other Ambulatory Visit: Payer: BC Managed Care – PPO

## 2016-05-07 ENCOUNTER — Ambulatory Visit: Payer: BC Managed Care – PPO | Admitting: Endocrinology

## 2016-05-26 DIAGNOSIS — H524 Presbyopia: Secondary | ICD-10-CM | POA: Insufficient documentation

## 2016-06-06 ENCOUNTER — Other Ambulatory Visit: Payer: BC Managed Care – PPO

## 2016-06-10 ENCOUNTER — Other Ambulatory Visit (INDEPENDENT_AMBULATORY_CARE_PROVIDER_SITE_OTHER): Payer: BC Managed Care – PPO

## 2016-06-10 ENCOUNTER — Ambulatory Visit: Payer: BC Managed Care – PPO | Admitting: Endocrinology

## 2016-06-10 DIAGNOSIS — E890 Postprocedural hypothyroidism: Secondary | ICD-10-CM | POA: Diagnosis not present

## 2016-06-10 LAB — TSH: TSH: 1.55 u[IU]/mL (ref 0.35–4.50)

## 2016-06-10 LAB — T4, FREE: Free T4: 1.64 ng/dL — ABNORMAL HIGH (ref 0.60–1.60)

## 2016-06-16 ENCOUNTER — Ambulatory Visit (INDEPENDENT_AMBULATORY_CARE_PROVIDER_SITE_OTHER): Payer: BC Managed Care – PPO | Admitting: Endocrinology

## 2016-06-16 ENCOUNTER — Encounter: Payer: Self-pay | Admitting: Endocrinology

## 2016-06-16 VITALS — BP 102/68 | HR 70 | Ht 66.0 in | Wt 225.0 lb

## 2016-06-16 DIAGNOSIS — E890 Postprocedural hypothyroidism: Secondary | ICD-10-CM | POA: Diagnosis not present

## 2016-06-16 NOTE — Progress Notes (Signed)
Andrea Delacruz 56 y.o.             Reason for Appointment:  Hypothyroidism, followup visit   History of Present Illness:   Her hypothyroidism was first diagnosed  about 20 years ago after her I-131 treatment for hyperthyroidism The therapy that the patient has taken include brand name Synthroid.  In 11/14 because of a TSH of 0.16 her dose was reduced to 100 mcg  In 7/15 her TSH was low normal at 0.43  but in 1/16 her TSH had increased further to 5.1 At that time she was starting to feel more fatigue for the previous 3 months and also some hair loss  She was switched to 112 g once a day in 1/16 and she has been taking this fairly regularly in the morning  Recent history: On her visit in 8/17 she was complaining about increasing fatigue and achiness for about 3 months and also 5 pound weight gain. Because of her TSH being about 10 her Synthroid dose was increased from 112 to 137 g. She felt much better with this  However on follow-up her TSH levels continued to be low  She is now taking 112 g of brand name SYNTHROID, 7-1/2 tablets a week She has been compliant with her medication, continues to take brand-name  Overall she feels fairly good although weight has gone up a little  However her TSH is now back to normal  Has not taken any new medications or OTC supplements, continues to take biotin which she has for a year  Wt Readings from Last 3 Encounters:  06/16/16 225 lb (102.1 kg)  04/25/16 225 lb (102.1 kg)  01/30/16 217 lb (98.4 kg)    Labs:  Lab Results  Component Value Date   TSH 1.55 06/10/2016   TSH 0.21 (L) 04/22/2016   TSH 0.34 (L) 01/25/2016   FREET4 1.64 (H) 06/10/2016   FREET4 1.45 04/22/2016   FREET4 1.36 01/25/2016     Allergies as of 06/16/2016   No Known Allergies     Medication List       Accurate as of 06/16/16  1:34 PM. Always use your most recent med list.          Biotin 16109 MCG Tabs Take 1 tablet by mouth daily.     hydrochlorothiazide 25 MG tablet Commonly known as:  HYDRODIURIL   lisinopril 10 MG tablet Commonly known as:  PRINIVIL,ZESTRIL Take 10 mg by mouth daily.   SYNTHROID 112 MCG tablet Generic drug:  levothyroxine Take 1 tablet (112 mcg total) by mouth daily before breakfast. Extra 1/2 pill on Sundays   Turmeric 500 MG Tabs Take 1 tablet by mouth daily.   vitamin B-12 1000 MCG tablet Commonly known as:  CYANOCOBALAMIN Take 1,000 mcg by mouth daily.       Allergies: No Known Allergies  Past Medical History:  Diagnosis Date  . Hypertension   . Obesity   . Thyroid disease    Hyperactive then was treated with iodine.     Past Surgical History:  Procedure Laterality Date  . BREAST REDUCTION SURGERY  [id number]    Family History  Problem Relation Age of Onset  . Stroke Mother   . Diabetes Mother   . Diabetes Father   . Diabetes Sister   . Hypertension Sister   . Hypertension Maternal Aunt   . Cancer Maternal Uncle   . Diabetes Paternal Aunt   . Diabetes Paternal Uncle   . Cancer Maternal  Grandmother     Social History:  reports that she has never smoked. She has never used smokeless tobacco. She reports that she drinks alcohol. She reports that she does not use drugs.  REVIEW Of SYSTEMS:  Weight range:   Wt Readings from Last 3 Encounters:  06/16/16 225 lb (102.1 kg)  04/25/16 225 lb (102.1 kg)  01/30/16 217 lb (98.4 kg)   History of hypertension on a 2 drug regimen, followed by PCP    Examination:   BP 102/68   Pulse 70   Ht 5\' 6"  (1.676 m)   Wt 225 lb (102.1 kg)   SpO2 96%   BMI 36.32 kg/m   She looks well.      Assessment/Plan   Hypothyroidism, post ablative  and long-standing.  She has had variable requirements for her thyroid supplement even with taking brand name Synthroid Although her TSH was previously normal with 137, She is now taking a lower dose of 112 g, 7-1/2 tablets per week. She feels good overall TSH is back to normal  She  can come back in 4 months now to make sure her levels are stable   Andrea Delacruz 06/16/2016, 1:34 PM

## 2016-09-11 ENCOUNTER — Other Ambulatory Visit: Payer: Self-pay | Admitting: Endocrinology

## 2016-10-10 ENCOUNTER — Other Ambulatory Visit (INDEPENDENT_AMBULATORY_CARE_PROVIDER_SITE_OTHER): Payer: BC Managed Care – PPO

## 2016-10-10 DIAGNOSIS — E890 Postprocedural hypothyroidism: Secondary | ICD-10-CM

## 2016-10-10 LAB — TSH: TSH: 2.32 u[IU]/mL (ref 0.35–4.50)

## 2016-10-10 LAB — T4, FREE: Free T4: 1.66 ng/dL — ABNORMAL HIGH (ref 0.60–1.60)

## 2016-10-14 NOTE — Progress Notes (Signed)
Andrea Delacruz 56 y.o.             Reason for Appointment:  Hypothyroidism, followup visit   History of Present Illness:   Her hypothyroidism was first diagnosed  about 20 years ago after her I-131 treatment for hyperthyroidism The therapy that the patient has taken include brand name Synthroid.  In 11/14 because of a TSH of 0.16 her dose was reduced to 100 mcg  In 7/15 her TSH was low normal at 0.43  but in 1/16 her TSH had increased further to 5.1 At that time she was starting to feel more fatigue for the previous 3 months and also some hair loss  She was switched to 112 g once a day in 1/16 and she has been taking this fairly regularly in the morning  Recent history: On her visit in 8/17 she was complaining about increasing fatigue and achiness for about 3 months and also 5 pound weight gain. Because of her TSH being about 10 her Synthroid dose was increased from 112 to 137 g. She felt much better with this, however subsequently has required lower dose  She is now taking 112 g of brand name SYNTHROID, 7-1/2 tablets a week and she takes the extra half tablet on Sunday She has been compliant with her medication, continues to take brand-name  Overall she feels fairly good with her energy level Her weight has come down with phentermine Now TSH is consistently back to normal  Has not taken any new medications or OTC supplements, continues to take biotin which she has for a year, free T4 is relatively high as before  Wt Readings from Last 3 Encounters:  10/15/16 216 lb 6.4 oz (98.2 kg)  06/16/16 225 lb (102.1 kg)  04/25/16 225 lb (102.1 kg)    Labs:  Lab Results  Component Value Date   TSH 2.32 10/10/2016   TSH 1.55 06/10/2016   TSH 0.21 (L) 04/22/2016   FREET4 1.66 (H) 10/10/2016   FREET4 1.64 (H) 06/10/2016   FREET4 1.45 04/22/2016     Allergies as of 10/15/2016   No Known Allergies     Medication List       Accurate as of 10/15/16  8:36 AM. Always use  your most recent med list.          Biotin 16109 MCG Tabs Take 1 tablet by mouth daily.   hydrochlorothiazide 25 MG tablet Commonly known as:  HYDRODIURIL   lisinopril 10 MG tablet Commonly known as:  PRINIVIL,ZESTRIL Take 10 mg by mouth daily.   phentermine 37.5 MG tablet Commonly known as:  ADIPEX-P Take 37.5 mg by mouth daily before breakfast.   SYNTHROID 112 MCG tablet Generic drug:  levothyroxine TAKE 1 TABLET BY MOUTH EVERY DAY BEFORE BREAKFAST. EXTRA 1/2 TAB ON SUNDAYS   Turmeric 500 MG Tabs Take 1 tablet by mouth daily.   vitamin B-12 1000 MCG tablet Commonly known as:  CYANOCOBALAMIN Take 1,000 mcg by mouth daily.       Allergies: No Known Allergies  Past Medical History:  Diagnosis Date  . Hypertension   . Obesity   . Thyroid disease    Hyperactive then was treated with iodine.     Past Surgical History:  Procedure Laterality Date  . BREAST REDUCTION SURGERY  [id number]    Family History  Problem Relation Age of Onset  . Stroke Mother   . Diabetes Mother   . Diabetes Father   . Diabetes Sister   .  Hypertension Sister   . Hypertension Maternal Aunt   . Cancer Maternal Uncle   . Diabetes Paternal Aunt   . Diabetes Paternal Uncle   . Cancer Maternal Grandmother     Social History:  reports that she has never smoked. She has never used smokeless tobacco. She reports that she drinks alcohol. She reports that she does not use drugs.  REVIEW Of SYSTEMS:  Weight range:   Wt Readings from Last 3 Encounters:  10/15/16 216 lb 6.4 oz (98.2 kg)  06/16/16 225 lb (102.1 kg)  04/25/16 225 lb (102.1 kg)   History of hypertension on a 2 drug regimen, followed by PCP  She is taking phentermine from a weight loss clinic and is not aware of her high pulse rate   Examination:   BP 120/80   Pulse 99   Ht 5\' 6"  (1.676 m)   Wt 216 lb 6.4 oz (98.2 kg)   SpO2 97%   BMI 34.93 kg/m   She looks well.      Assessment/Plan   Hypothyroidism, post  ablative  and long-standing.  She has had variable requirements previously for her thyroid supplement even with taking brand name Synthroid She is not requiring relatively lower dose of 112 g, 7-1/2 tablets per week. She feels good overall TSH is consistently back to normal and is about 2, lab was drawn on Friday with her extra half tablet being [place] on Sundays She will continue the same regimen She can come back in 4 months now to make sure her levels are stable  Sinus tachycardia: Advised her to discuss with the prescribing physician about reducing phentermine dose  Andrea Delacruz 10/15/2016, 8:36 AM

## 2016-10-15 ENCOUNTER — Ambulatory Visit (INDEPENDENT_AMBULATORY_CARE_PROVIDER_SITE_OTHER): Payer: BC Managed Care – PPO | Admitting: Endocrinology

## 2016-10-15 ENCOUNTER — Encounter: Payer: Self-pay | Admitting: Endocrinology

## 2016-10-15 VITALS — BP 120/80 | HR 99 | Ht 66.0 in | Wt 216.4 lb

## 2016-10-15 DIAGNOSIS — E890 Postprocedural hypothyroidism: Secondary | ICD-10-CM | POA: Diagnosis not present

## 2017-01-14 DIAGNOSIS — N3946 Mixed incontinence: Secondary | ICD-10-CM | POA: Insufficient documentation

## 2017-01-14 DIAGNOSIS — N8111 Cystocele, midline: Secondary | ICD-10-CM | POA: Insufficient documentation

## 2017-03-03 ENCOUNTER — Other Ambulatory Visit: Payer: Self-pay

## 2017-03-03 MED ORDER — SYNTHROID 112 MCG PO TABS: ORAL_TABLET | ORAL | 3 refills | Status: DC

## 2017-03-06 ENCOUNTER — Other Ambulatory Visit: Payer: Self-pay

## 2017-03-06 MED ORDER — SYNTHROID 112 MCG PO TABS: ORAL_TABLET | ORAL | 2 refills | Status: DC

## 2017-04-06 ENCOUNTER — Other Ambulatory Visit: Payer: Self-pay | Admitting: Endocrinology

## 2017-04-06 DIAGNOSIS — E890 Postprocedural hypothyroidism: Secondary | ICD-10-CM

## 2017-04-14 ENCOUNTER — Other Ambulatory Visit: Payer: BC Managed Care – PPO

## 2017-04-15 ENCOUNTER — Other Ambulatory Visit (INDEPENDENT_AMBULATORY_CARE_PROVIDER_SITE_OTHER): Payer: BC Managed Care – PPO

## 2017-04-15 DIAGNOSIS — E890 Postprocedural hypothyroidism: Secondary | ICD-10-CM

## 2017-04-15 LAB — T4, FREE: FREE T4: 1.1 ng/dL (ref 0.60–1.60)

## 2017-04-15 LAB — TSH: TSH: 0.42 u[IU]/mL (ref 0.35–4.50)

## 2017-04-16 NOTE — Progress Notes (Signed)
Andrea Delacruz 56 y.o.             Reason for Appointment:  Hypothyroidism, followup visit   History of Present Illness:   Her hypothyroidism was first diagnosed  about 20 years ago after her I-131 treatment for hyperthyroidism The therapy that the patient has taken include brand name Synthroid.  In 11/14 because of a TSH of 0.16 her dose was reduced to 100 mcg  In 7/15 her TSH was low normal at 0.43  but in 1/16 her TSH had increased further to 5.1 At that time she was starting to feel more fatigue for the previous 3 months and also some hair loss  She was switched to 112 g once a day in 1/16 and she has been taking this fairly regularly in the morning  Recent history: On her visit in 8/17 she was complaining about increasing fatigue and achiness for about 3 months and also 5 pound weight gain. Because of her TSH being about 10 her Synthroid dose was increased from 112 to 137 g. She felt much better with this, however subsequently has required lower dose  She is still taking 112 g of brand name SYNTHROID, 7-1/2 tablets a week and she takes the extra half tablet on Sunday She has been compliant with her medication before breakfast consistently  Overall she feels fairly good with her energy level, no unusual fatigue Her weight has gone back up with stopping phentermine  Although her TSH is still normal it is now down to 0.4 compared to 2.3 previously She tends to have a high free T4 but this is back to normal now Previously may have been related to taking biotin   Wt Readings from Last 3 Encounters:  04/17/17 226 lb 12.8 oz (102.9 kg)  10/15/16 216 lb 6.4 oz (98.2 kg)  06/16/16 225 lb (102.1 kg)    Labs:  Lab Results  Component Value Date   TSH 0.42 04/15/2017   TSH 2.32 10/10/2016   TSH 1.55 06/10/2016   FREET4 1.10 04/15/2017   FREET4 1.66 (H) 10/10/2016   FREET4 1.64 (H) 06/10/2016     Allergies as of 04/17/2017   No Known Allergies     Medication List         Accurate as of 04/17/17  8:21 AM. Always use your most recent med list.          atorvastatin 40 MG tablet Commonly known as:  LIPITOR Take 40 mg by mouth daily.   Biotin 10000 MCG Tabs Take 1 tablet by mouth daily.   hydrochlorothiazide 25 MG tablet Commonly known as:  HYDRODIURIL   lisinopril 10 MG tablet Commonly known as:  PRINIVIL,ZESTRIL Take 10 mg by mouth daily.   metFORMIN 500 MG tablet Commonly known as:  GLUCOPHAGE   ONETOUCH VERIO test strip Generic drug:  glucose blood USE TO TEST BS DAILY   ONETOUCH VERIO w/Device Kit daily. use as directed   phentermine 37.5 MG tablet Commonly known as:  ADIPEX-P Take 37.5 mg by mouth daily before breakfast.   SYNTHROID 112 MCG tablet Generic drug:  levothyroxine TAKE 1 TABLET BY MOUTH EVERY DAY BEFORE BREAKFAST. EXTRA 1/2 TAB ON SUNDAYS   Turmeric 500 MG Tabs Take 1 tablet by mouth daily.   vitamin B-12 1000 MCG tablet Commonly known as:  CYANOCOBALAMIN Take 1,000 mcg by mouth daily.       Allergies: No Known Allergies  Past Medical History:  Diagnosis Date  . Hypertension   .  Obesity   . Thyroid disease    Hyperactive then was treated with iodine.     Past Surgical History:  Procedure Laterality Date  . BREAST REDUCTION SURGERY  [id number]    Family History  Problem Relation Age of Onset  . Stroke Mother   . Diabetes Mother   . Diabetes Father   . Diabetes Sister   . Hypertension Sister   . Hypertension Maternal Aunt   . Cancer Maternal Uncle   . Diabetes Paternal Aunt   . Diabetes Paternal Uncle   . Cancer Maternal Grandmother     Social History:  reports that  has never smoked. she has never used smokeless tobacco. She reports that she drinks alcohol. She reports that she does not use drugs.  REVIEW Of SYSTEMS:  Weight range:   Wt Readings from Last 3 Encounters:  04/17/17 226 lb 12.8 oz (102.9 kg)  10/15/16 216 lb 6.4 oz (98.2 kg)  06/16/16 225 lb (102.1 kg)   History  of hypertension on a 2 drug regimen, followed by PCP     Examination:   BP 110/72   Pulse 80   Ht '5\' 6"'$  (1.676 m)   Wt 226 lb 12.8 oz (102.9 kg)   SpO2 97%   BMI 36.61 kg/m   She looks well.      Assessment/Plan   Hypothyroidism, post ablative  and long-standing.  She has had variable requirements previously for her thyroid supplement even with taking brand name Synthroid She is requiring relatively lower dose of 112 g, 7-1/2 tablets per week. She is doing subjectively well  TSH is consistently normal although compared to last him it is now low normal at 0.42  Normalize her TSH level she will not take the extra half tablet once a week of her brand name Synthroid Follow-up in 6 months  Adlyn Fife 04/17/2017, 8:21 AM

## 2017-04-17 ENCOUNTER — Encounter: Payer: Self-pay | Admitting: Endocrinology

## 2017-04-17 ENCOUNTER — Ambulatory Visit: Payer: BC Managed Care – PPO | Admitting: Endocrinology

## 2017-04-17 VITALS — BP 110/72 | HR 80 | Ht 66.0 in | Wt 226.8 lb

## 2017-04-17 DIAGNOSIS — E890 Postprocedural hypothyroidism: Secondary | ICD-10-CM | POA: Diagnosis not present

## 2017-04-17 NOTE — Patient Instructions (Addendum)
Take only 1 Synthroid daily

## 2017-05-04 ENCOUNTER — Ambulatory Visit: Payer: BC Managed Care – PPO | Admitting: Dietician

## 2017-07-29 ENCOUNTER — Encounter: Payer: BC Managed Care – PPO | Attending: Family Medicine | Admitting: *Deleted

## 2017-07-29 DIAGNOSIS — Z713 Dietary counseling and surveillance: Secondary | ICD-10-CM | POA: Insufficient documentation

## 2017-07-29 DIAGNOSIS — E119 Type 2 diabetes mellitus without complications: Secondary | ICD-10-CM | POA: Diagnosis present

## 2017-07-29 NOTE — Patient Instructions (Signed)
Plan:  Aim for 2 Carb Choices per meal (30 grams) +/- 1 either way  Aim for 0-1 Carbs per snack if hungry  Include protein in moderation with your meals and snacks Consider reading food labels for Total Carbohydrate of foods Continue with your activity level by going to gym for 30 minutes several days a week as tolerated Consider checking BG at alternate times per day including after exercise   Continue taking medication as directed by MD

## 2017-07-31 NOTE — Progress Notes (Signed)
Diabetes Self-Management Education  Visit Type: First/Initial  Appt. Start Time: 1400 Appt. End Time: 1530  07/31/2017  Ms. Andrea GaskinsAngella Delacruz, identified by name and date of birth, is a 57 y.o. female with a diagnosis of Diabetes: Type 2. Patient is fairly new to diabetes. 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 She lost her mother recently, so is dealing with those emotions and responsibilities. She states her husband is very supportive. She has started testing her BG's and all numbers she reports are within the target ranges.   ASSESSMENT  Height 5\' 7"  (1.702 m), weight 207 lb 11.2 oz (94.2 kg). Body mass index is 32.53 kg/m.  Diabetes Self-Management Education - 07/29/17 1410      Visit Information   Visit Type  First/Initial      Initial Visit   Diabetes Type  Type 2    Are you currently following a meal plan?  No    Are you taking your medications as prescribed?  Yes    Date Diagnosed  04/2017      Health Coping   How would you rate your overall health?  Good      Psychosocial Assessment   Patient Belief/Attitude about Diabetes  Motivated to manage diabetes    Self-care barriers  None    Other persons present  Patient    Patient Concerns  Nutrition/Meal planning;Glycemic Control    Special Needs  None    Learning Readiness  Change in progress    How often do you need to have someone help you when you read instructions, pamphlets, or other written materials from your doctor or pharmacy?  1 - Never    What is the last grade level you completed in school?  PHD      Pre-Education Assessment   Patient understands the diabetes disease and treatment process.  Needs Instruction    Patient understands incorporating nutritional management into lifestyle.  Needs Instruction    Patient undertands incorporating physical activity into lifestyle.  Needs Instruction    Patient understands using medications safely.  Needs Instruction     Patient understands monitoring blood glucose, interpreting and using results  Needs Review    Patient understands prevention, detection, and treatment of acute complications.  Needs Instruction    Patient understands prevention, detection, and treatment of chronic complications.  Needs Instruction    Patient understands how to develop strategies to address psychosocial issues.  Needs Instruction    Patient understands how to develop strategies to promote health/change behavior.  Needs Instruction      Complications   Last HgB A1C per patient/outside source  7.7 %    How often do you check your blood sugar?  1-2 times/day    Fasting Blood glucose range (mg/dL)  [phone]    Postprandial Blood glucose range (mg/dL)  [phone]    Have you had a dilated eye exam in the past 12 months?  Yes    Have you had a dental exam in the past 12 months?  Yes    Are you checking your feet?  No      Dietary Intake   Breakfast  egg white and 1 slices bacon    Snack (morning)  fresh fruit    Lunch  skips often OR fresh fruit OR vegetables from cafeteria    Snack (afternoon)  small bag of chips    Dinner  2 vegetables including potato, corn or beans, meat,  Snack (evening)  package of crackers or fresh fruit    Beverage(s)  water      Exercise   Exercise Type  Light (walking / raking leaves) gym with personal trainer in a group    How many days per week to you exercise?  4    How many minutes per day do you exercise?  30    Total minutes per week of exercise  120      Patient Education   Previous Diabetes Education  No    Disease state   Definition of diabetes, type 1 and 2, and the diagnosis of diabetes;Factors that contribute to the development of diabetes    Nutrition management   Role of diet in the treatment of diabetes and the relationship between the three main macronutrients and blood glucose level;Carbohydrate counting;Food label reading, portion sizes and measuring food.    Physical activity  and exercise   Role of exercise on diabetes management, blood pressure control and cardiac health.    Medications  Reviewed patients medication for diabetes, action, purpose, timing of dose and side effects.    Monitoring  Purpose and frequency of SMBG.;Identified appropriate SMBG and/or A1C goals.    Chronic complications  Relationship between chronic complications and blood glucose control    Psychosocial adjustment  Role of stress on diabetes      Individualized Goals (developed by patient)   Nutrition  Follow meal plan discussed    Physical Activity  Exercise 3-5 times per week    Medications  take my medication as prescribed    Monitoring   test blood glucose pre and post meals as discussed      Post-Education Assessment   Patient understands the diabetes disease and treatment process.  Demonstrates understanding / competency    Patient understands incorporating nutritional management into lifestyle.  Demonstrates understanding / competency    Patient undertands incorporating physical activity into lifestyle.  Demonstrates understanding / competency    Patient understands using medications safely.  Demonstrates understanding / competency    Patient understands monitoring blood glucose, interpreting and using results  Demonstrates understanding / competency    Patient understands prevention, detection, and treatment of acute complications.  Demonstrates understanding / competency    Patient understands prevention, detection, and treatment of chronic complications.  Demonstrates understanding / competency    Patient understands how to develop strategies to address psychosocial issues.  Demonstrates understanding / competency    Patient understands how to develop strategies to promote health/change behavior.  Demonstrates understanding / competency      Outcomes   Expected Outcomes  Demonstrated interest in learning. Expect positive outcomes    Future DMSE  PRN    Program Status   Completed       Individualized Plan for Diabetes Self-Management Training:   Learning Objective:  Patient will have a greater understanding of diabetes self-management. Patient education plan is to attend individual and/or group sessions per assessed needs and concerns.   Plan:   Patient Instructions  Plan:  Aim for 2 Carb Choices per meal (30 grams) +/- 1 either way  Aim for 0-1 Carbs per snack if hungry  Include protein in moderation with your meals and snacks Consider reading food labels for Total Carbohydrate of foods Continue with your activity level by going to gym for 30 minutes several days a week as tolerated Consider checking BG at alternate times per day including after exercise   Continue taking medication as directed by MD  Expected Outcomes:  Demonstrated interest in learning. Expect positive outcomes  Education material provided: Living Well with Diabetes, A1C conversion sheet, Meal plan card and Carbohydrate counting sheet  If problems or questions, patient to contact team via:  Phone  Future DSME appointment: PRN

## 2017-08-10 ENCOUNTER — Other Ambulatory Visit: Payer: BC Managed Care – PPO

## 2017-08-13 ENCOUNTER — Ambulatory Visit: Payer: BC Managed Care – PPO | Admitting: Endocrinology

## 2017-08-13 ENCOUNTER — Encounter: Payer: Self-pay | Admitting: Endocrinology

## 2017-08-13 VITALS — BP 118/80 | HR 91 | Ht 67.0 in | Wt 209.0 lb

## 2017-08-13 DIAGNOSIS — E890 Postprocedural hypothyroidism: Secondary | ICD-10-CM | POA: Diagnosis not present

## 2017-08-13 NOTE — Progress Notes (Signed)
Andrea Delacruz 57 y.o.             Reason for Appointment:  Hypothyroidism, followup visit   History of Present Illness:   Her hypothyroidism was first diagnosed  about 20 years ago after her I-131 treatment for hyperthyroidism The therapy that the patient has taken include brand name Synthroid.  In 11/14 because of a TSH of 0.16 her dose was reduced to 100 mcg  In 7/15 her TSH was low normal at 0.43  but in 1/16 her TSH had increased further to 5.1 At that time she was starting to feel more fatigue for the previous 3 months and also some hair loss  She was switched to 112 g once a day in 1/16 and she has been taking this fairly regularly in the morning  Recent history: On her visit in 8/17 she was complaining about increasing fatigue and achiness for about 3 months and also 5 pound weight gain. Because of her TSH being about 10 her Synthroid dose was increased from 112 to 137 g. She felt much better with this, however subsequently has required lower dose  She is still taking 112 g of brand name SYNTHROID, 1 tablet daily  She has been compliant with her medication 15-30 minutes before breakfast consistently  Overall she feels fairly good with her energy level, no unusual fatigue She has been trying to exercise and keep her weight down, previously was 17 pounds heavier  She still continues to take biotin with no definite improvement in skin or hair  Currently she is overdue for her lab work and her last TSH was low normal at 0.42 At that time she was told not to take an extra half pill weekly   Wt Readings from Last 3 Encounters:  08/13/17 209 lb (94.8 kg)  07/29/17 207 lb 11.2 oz (94.2 kg)  04/17/17 226 lb 12.8 oz (102.9 kg)    Labs:  Lab Results  Component Value Date   TSH 0.42 04/15/2017   TSH 2.32 10/10/2016   TSH 1.55 06/10/2016   FREET4 1.10 04/15/2017   FREET4 1.66 (H) 10/10/2016   FREET4 1.64 (H) 06/10/2016     Allergies as of 08/13/2017   No Known  Allergies     Medication List        Accurate as of 08/13/17  3:46 PM. Always use your most recent med list.          atorvastatin 40 MG tablet Commonly known as:  LIPITOR Take 40 mg by mouth daily.   Biotin 10000 MCG Tabs Take 1 tablet by mouth daily.   hydrochlorothiazide 25 MG tablet Commonly known as:  HYDRODIURIL   lisinopril 10 MG tablet Commonly known as:  PRINIVIL,ZESTRIL Take 10 mg by mouth daily.   metFORMIN 500 MG tablet Commonly known as:  GLUCOPHAGE   ONETOUCH VERIO test strip Generic drug:  glucose blood USE TO TEST BS DAILY   ONETOUCH VERIO w/Device Kit daily. use as directed   SYNTHROID 112 MCG tablet Generic drug:  levothyroxine TAKE 1 TABLET BY MOUTH EVERY DAY BEFORE BREAKFAST. EXTRA 1/2 TAB ON SUNDAYS   Turmeric 500 MG Tabs Take 1 tablet by mouth daily.   vitamin B-12 1000 MCG tablet Commonly known as:  CYANOCOBALAMIN Take 1,000 mcg by mouth daily.       Allergies: No Known Allergies  Past Medical History:  Diagnosis Date  . Hypertension   . Obesity   . Thyroid disease    Hyperactive then  was treated with iodine.     Past Surgical History:  Procedure Laterality Date  . BREAST REDUCTION SURGERY  [id number]    Family History  Problem Relation Age of Onset  . Stroke Mother   . Diabetes Mother   . Diabetes Father   . Diabetes Sister   . Hypertension Sister   . Hypertension Maternal Aunt   . Cancer Maternal Uncle   . Diabetes Paternal Aunt   . Diabetes Paternal Uncle   . Cancer Maternal Grandmother     Social History:  reports that she has never smoked. She has never used smokeless tobacco. She reports that she drinks alcohol. She reports that she does not use drugs.  REVIEW Of SYSTEMS:  Weight range:   Wt Readings from Last 3 Encounters:  08/13/17 209 lb (94.8 kg)  07/29/17 207 lb 11.2 oz (94.2 kg)  04/17/17 226 lb 12.8 oz (102.9 kg)   History of hypertension on a 2 drug regimen, followed by PCP      Examination:   BP 118/80 (BP Location: Left Arm, Patient Position: Sitting, Cuff Size: Large)   Pulse 91   Ht '5\' 7"'$  (1.702 m)   Wt 209 lb (94.8 kg)   SpO2 97%   BMI 32.73 kg/m   She looks well.  Biceps reflexes appear normal    Assessment/Plan   Hypothyroidism, post ablative  and long-standing.  She has had variable requirements previously for her thyroid supplement even with taking brand name Synthroid Now taking 112 mcg daily  She although she was supposed to have labs before her visit she has not done so Currently still taking biotin which had previously caused a higher free T4  On the last visit her dose was reduced by half tablet weekly since TSH was low normal  She is clinically doing well and no unusual fatigue Compliant with her regimen  DIABETES on metformin alone: She will follow-up with PCP but discussed that if she has difficulty getting good control will need to consider additional medication  Follow-up in 6 months if her labs are normal  [name] 08/13/2017, 3:46 PM

## 2017-08-13 NOTE — Patient Instructions (Signed)
No biotin for 2 days before labs

## 2017-08-21 ENCOUNTER — Other Ambulatory Visit (INDEPENDENT_AMBULATORY_CARE_PROVIDER_SITE_OTHER): Payer: BC Managed Care – PPO

## 2017-08-21 DIAGNOSIS — E890 Postprocedural hypothyroidism: Secondary | ICD-10-CM

## 2017-08-21 LAB — T4, FREE: Free T4: 0.86 ng/dL (ref 0.60–1.60)

## 2017-08-21 LAB — TSH: TSH: 5.22 u[IU]/mL — ABNORMAL HIGH (ref 0.35–4.50)

## 2017-08-24 ENCOUNTER — Other Ambulatory Visit: Payer: Self-pay

## 2017-08-24 ENCOUNTER — Telehealth: Payer: Self-pay

## 2017-08-24 DIAGNOSIS — E890 Postprocedural hypothyroidism: Secondary | ICD-10-CM

## 2017-08-24 MED ORDER — LEVOTHYROXINE SODIUM 125 MCG PO TABS: 125.0000 ug | ORAL_TABLET | Freq: Every day | ORAL | 3 refills | Status: DC

## 2017-08-24 MED ORDER — LEVOTHYROXINE SODIUM 125 MCG PO TABS: 125.0000 ug | ORAL_TABLET | Freq: Every day | ORAL | 4 refills | Status: DC

## 2017-08-24 NOTE — Telephone Encounter (Signed)
Pt informed of below. Transferred to scheduler. New RX sent. See meds.

## 2017-09-07 ENCOUNTER — Ambulatory Visit: Payer: BC Managed Care – PPO | Admitting: Cardiology

## 2017-09-07 ENCOUNTER — Encounter: Payer: Self-pay | Admitting: Cardiology

## 2017-09-07 VITALS — BP 110/68 | HR 70 | Ht 67.0 in | Wt 206.0 lb

## 2017-09-07 DIAGNOSIS — E119 Type 2 diabetes mellitus without complications: Secondary | ICD-10-CM | POA: Insufficient documentation

## 2017-09-07 DIAGNOSIS — R002 Palpitations: Secondary | ICD-10-CM | POA: Diagnosis not present

## 2017-09-07 DIAGNOSIS — E785 Hyperlipidemia, unspecified: Secondary | ICD-10-CM | POA: Insufficient documentation

## 2017-09-07 DIAGNOSIS — I10 Essential (primary) hypertension: Secondary | ICD-10-CM | POA: Diagnosis not present

## 2017-09-07 NOTE — Progress Notes (Signed)
Cardiology Consultation:    Date:  09/07/2017   ID:  Andrea Delacruz, DOB 01-15-1961, MRN [id number]  PCP:  Leighton Ruff, MD  Cardiologist:  Jenne Campus, MD   Referring MD: Leighton Ruff, MD   Chief Complaint  Patient presents with  . Palpitations    Has not noticed the palpitations since Levothyroxine was increased  I have palpitations  History of Present Illness:    Andrea Delacruz is a 56 y.o. female who is being seen today for the evaluation of palpitations at the request of Leighton Ruff, MD.  For last few weeks she noticed palpitations.  She will be aware of her heart speeding up somewhat and beating irregularly.  She does not have any dizziness no sweating no shortness of breath no chest pain associated with this sensation.  Recently she was find to be hypothyroid taken her medication has been increased interestingly since that time she seems to be feeling better.  She said for last 5 days she did not have any palpitations.  She does exercise on the regular basis goes to gym 3 times a week exercise for 45 minutes of no difficulty doing it.  Recently she was recognized with a diagnosis of diabetes and appropriate therapy has been initiated.  She does have dyslipidemia which is very well managed.  She never smoked does have family history of premature heart problem.  Past Medical History:  Diagnosis Date  . Hypertension   . Obesity   . Thyroid disease    Hyperactive then was treated with iodine.     Past Surgical History:  Procedure Laterality Date  . BREAST REDUCTION SURGERY  [id number]    Current Medications: Current Meds  Medication Sig  . atorvastatin (LIPITOR) 40 MG tablet Take 40 mg by mouth daily.  . Biotin 10000 MCG TABS Take 1 tablet by mouth daily.  . Blood Glucose Monitoring Suppl (ONETOUCH VERIO) w/Device KIT daily. use as directed  . hydrochlorothiazide (HYDRODIURIL) 25 MG tablet   . levothyroxine (SYNTHROID, LEVOTHROID) 125 MCG tablet  Take 1 tablet (125 mcg total) by mouth daily.  Marland Kitchen lisinopril (PRINIVIL,ZESTRIL) 10 MG tablet Take 10 mg by mouth daily.  . metFORMIN (GLUCOPHAGE) 500 MG tablet   . ONETOUCH VERIO test strip USE TO TEST BS DAILY  . oxybutynin (DITROPAN) 5 MG tablet Take 5 mg by mouth 3 (three) times daily.  . phentermine 37.5 MG capsule Take 37.5 mg by mouth every morning.  . Potassium 99 MG TABS Take 1 tablet by mouth daily.  . Turmeric 500 MG TABS Take 1 tablet by mouth daily.  . vitamin B-12 (CYANOCOBALAMIN) 1000 MCG tablet Take 1,000 mcg by mouth daily.     Allergies:   Patient has no known allergies.   Social History   Socioeconomic History  . Marital status: Married    Spouse name: Not on file  . Number of children: Not on file  . Years of education: Not on file  . Highest education level: Not on file  Occupational History  . Not on file  Social Needs  . Financial resource strain: Not on file  . Food insecurity:    Worry: Not on file    Inability: Not on file  . Transportation needs:    Medical: Not on file    Non-medical: Not on file  Tobacco Use  . Smoking status: Never Smoker  . Smokeless tobacco: Never Used  Substance and Sexual Activity  . Alcohol use: Yes  Comment: Once oer year around holidays  . Drug use: No  . Sexual activity: Yes  Lifestyle  . Physical activity:    Days per week: Not on file    Minutes per session: Not on file  . Stress: Not on file  Relationships  . Social connections:    Talks on phone: Not on file    Gets together: Not on file    Attends religious service: Not on file    Active member of club or organization: Not on file    Attends meetings of clubs or organizations: Not on file    Relationship status: Not on file  Other Topics Concern  . Not on file  Social History Narrative   Marital Status:  Married Merchant navy officer)    Children:  G2 Daughter (40) Mining engineer) Twins (Adam and Antiyonne)   Pets: Cat (01)    Living Situation: Lives with husband     Occupation:  School Principal -  Musician    Education:  Designer, jewellery in Education    Tobacco Use/Exposure:  None    Alcohol Use:  Occasional   Drug Use:  None   Diet:  Regular   Exercise:  None   Hobbies: Reading            Family History: The patient's family history includes Cancer in her maternal grandmother and maternal uncle; Diabetes in her father, mother, paternal aunt, paternal uncle, and sister; Hypertension in her maternal aunt and sister; Stroke in her mother. ROS:   Please see the history of present illness.    All 14 point review of systems negative except as described per history of present illness.  EKGs/Labs/Other Studies Reviewed:    The following studies were reviewed today: Laboratory tests reviewed  EKG:  EKG is  ordered today.  The ekg ordered today demonstrates normal sinus rhythm normal P interval normal QS complex duration morphology no ST segment changes  Recent Labs: 08/21/2017: TSH 5.22  Recent Lipid Panel No results found for: CHOL, TRIG, HDL, CHOLHDL, VLDL, LDLCALC, LDLDIRECT  Physical Exam:    VS:  BP 110/68 (BP Location: Left Arm)   Pulse 70   Ht '5\' 7"'$  (1.702 m)   Wt 206 lb (93.4 kg)   SpO2 96%   BMI 32.26 kg/m     Wt Readings from Last 3 Encounters:  09/07/17 206 lb (93.4 kg)  08/13/17 209 lb (94.8 kg)  07/29/17 207 lb 11.2 oz (94.2 kg)     GEN:  Well nourished, well developed in no acute distress HEENT: Normal NECK: No JVD; No carotid bruits LYMPHATICS: No lymphadenopathy CARDIAC: RRR, no murmurs, no rubs, no gallops RESPIRATORY:  Clear to auscultation without rales, wheezing or rhonchi  ABDOMEN: Soft, non-tender, non-distended MUSCULOSKELETAL:  No edema; No deformity  SKIN: Warm and dry NEUROLOGIC:  Alert and oriented x 3 PSYCHIATRIC:  Normal affect   ASSESSMENT:    1. Essential hypertension, benign   2. Palpitations   3. Type 2 diabetes mellitus without complication, without long-term current use of insulin ([place])   4.  Dyslipidemia    PLAN:    In order of problems listed above:  1. Palpitations we will ask her to wear 2 weeks Holter monitor to see if he can pick up exactly what kind of arrhythmia she is experiencing.  As a part of her evaluation I will ask her to have an echocardiogram.  I will be looking for left ventricular ejection fraction.  She does not drink tea  she does not drink coffee does not drink caffeinated drinks 2. Essential hypertension: Blood pressure appears to be well controlled we will continue present management. 3. Type 2 diabetes.  Recent the treatment initiated her A1c is 7 need to be better but she exercised on the regular basis she is watching the way she eats she lost already a few pounds should get better with continuation of exercise and weight loss 4. Dyslipidemia: I check her fasting lipid profile by her primary care physician looks good we will continue present management.  See her back in 1 month or sooner if she has a problem   Medication Adjustments/Labs and Tests Ordered: Current medicines are reviewed at length with the patient today.  Concerns regarding medicines are outlined above.  No orders of the defined types were placed in this encounter.  No orders of the defined types were placed in this encounter.   Signed, Park Liter, MD, [facility]. 09/07/2017 10:50 AM    [place]

## 2017-09-07 NOTE — Patient Instructions (Signed)
Medication Instructions:  Your physician recommends that you continue on your current medications as directed. Please refer to the Current Medication list given to you today.   Labwork: None  Testing/Procedures: You had an EKG today.   Your physician has requested that you have an echocardiogram. Echocardiography is a painless test that uses sound waves to create images of your heart. It provides your doctor with information about the size and shape of your heart and how well your heart's chambers and valves are working. This procedure takes approximately one hour. There are no restrictions for this procedure.  Your physician has recommended that you wear a holter monitor. Holter monitors are medical devices that record the heart's electrical activity. Doctors most often use these monitors to diagnose arrhythmias. Arrhythmias are problems with the speed or rhythm of the heartbeat. The monitor is a small, portable device. You can wear one while you do your normal daily activities. This is usually used to diagnose what is causing palpitations/syncope (passing out). Wear for 14 days.   Follow-Up: Your physician recommends that you schedule a follow-up appointment in: 1 month.  Any Other Special Instructions Will Be Listed Below (If Applicable).     If you need a refill on your cardiac medications before your next appointment, please call your pharmacy.

## 2017-09-08 ENCOUNTER — Ambulatory Visit: Payer: BC Managed Care – PPO

## 2017-09-08 ENCOUNTER — Ambulatory Visit (HOSPITAL_COMMUNITY): Payer: BC Managed Care – PPO | Attending: Cardiovascular Disease

## 2017-09-08 ENCOUNTER — Other Ambulatory Visit: Payer: Self-pay

## 2017-09-08 DIAGNOSIS — R002 Palpitations: Secondary | ICD-10-CM

## 2017-09-08 DIAGNOSIS — E079 Disorder of thyroid, unspecified: Secondary | ICD-10-CM | POA: Insufficient documentation

## 2017-09-08 DIAGNOSIS — I10 Essential (primary) hypertension: Secondary | ICD-10-CM | POA: Insufficient documentation

## 2017-09-21 ENCOUNTER — Other Ambulatory Visit (HOSPITAL_COMMUNITY): Payer: BC Managed Care – PPO

## 2017-10-07 ENCOUNTER — Ambulatory Visit: Payer: BC Managed Care – PPO | Admitting: Cardiology

## 2017-10-16 ENCOUNTER — Ambulatory Visit: Payer: BC Managed Care – PPO | Admitting: Cardiology

## 2017-10-16 ENCOUNTER — Encounter: Payer: Self-pay | Admitting: Cardiology

## 2017-10-16 VITALS — BP 126/64 | HR 94 | Ht 67.0 in | Wt 204.4 lb

## 2017-10-16 DIAGNOSIS — E119 Type 2 diabetes mellitus without complications: Secondary | ICD-10-CM

## 2017-10-16 DIAGNOSIS — R002 Palpitations: Secondary | ICD-10-CM

## 2017-10-16 DIAGNOSIS — E785 Hyperlipidemia, unspecified: Secondary | ICD-10-CM

## 2017-10-16 DIAGNOSIS — I10 Essential (primary) hypertension: Secondary | ICD-10-CM | POA: Diagnosis not present

## 2017-10-16 MED ORDER — METOPROLOL TARTRATE 25 MG PO TABS: 12.5000 mg | ORAL_TABLET | Freq: Two times a day (BID) | ORAL | 2 refills | Status: DC | PRN

## 2017-10-16 MED ORDER — METOPROLOL TARTRATE 25 MG PO TABS: 12.5000 mg | ORAL_TABLET | Freq: Two times a day (BID) | ORAL | 2 refills | Status: DC

## 2017-10-16 NOTE — Patient Instructions (Signed)
Medication Instructions:  Your physician has recommended you make the following change in your medication: START metoprolol tartrate 12.5 mg twice daily as needed  Labwork: None  Testing/Procedures: None  Follow-Up: Your physician wants you to follow-up in: 5 months. You will receive a reminder letter in the mail two months in advance. If you don't receive a letter, please call our office to schedule the follow-up appointment.   If you need a refill on your cardiac medications before your next appointment, please call your pharmacy.   Thank you for choosing [facility]! Mady Gemma, RN [phone]

## 2017-10-16 NOTE — Progress Notes (Signed)
Cardiology Office Note:    Date:  10/16/2017   ID:  Andrea Delacruz, DOB 1961/01/22, MRN [id number]  PCP:  Leighton Ruff, MD  Cardiologist:  Jenne Campus, MD    Referring MD: Leighton Ruff, MD   Chief Complaint  Patient presents with  . 1 month follow up  Still have some palpitations but somewhat better  History of Present Illness:    Andrea Delacruz is a 57 y.o. female with palpitations, essential hypertension, diabetes.  Overall she is doing well still describe to have some palpitations but somewhat better less frequent.  No chest pain tightness squeezing pressure burning chest.  Goes to gym on the regular basis and exercise 4 times a week.  Past Medical History:  Diagnosis Date  . Hypertension   . Obesity   . Thyroid disease    Hyperactive then was treated with iodine.     Past Surgical History:  Procedure Laterality Date  . BREAST REDUCTION SURGERY  [id number]    Current Medications: Current Meds  Medication Sig  . atorvastatin (LIPITOR) 40 MG tablet Take 40 mg by mouth daily.  . Biotin 10000 MCG TABS Take 1 tablet by mouth daily.  . Blood Glucose Monitoring Suppl (ONETOUCH VERIO) w/Device KIT daily. use as directed  . hydrochlorothiazide (HYDRODIURIL) 25 MG tablet   . levothyroxine (SYNTHROID, LEVOTHROID) 125 MCG tablet Take 1 tablet (125 mcg total) by mouth daily.  Marland Kitchen lisinopril (PRINIVIL,ZESTRIL) 10 MG tablet Take 10 mg by mouth daily.  . metFORMIN (GLUCOPHAGE) 500 MG tablet   . ONETOUCH VERIO test strip USE TO TEST BS DAILY  . oxybutynin (DITROPAN) 5 MG tablet Take 5 mg by mouth 2 (two) times daily.   . Potassium 99 MG TABS Take 1 tablet by mouth daily.  . Turmeric 500 MG TABS Take 1 tablet by mouth daily.     Allergies:   Patient has no known allergies.   Social History   Socioeconomic History  . Marital status: Married    Spouse name: Not on file  . Number of children: Not on file  . Years of education: Not on file  . Highest education  level: Not on file  Occupational History  . Not on file  Social Needs  . Financial resource strain: Not on file  . Food insecurity:    Worry: Not on file    Inability: Not on file  . Transportation needs:    Medical: Not on file    Non-medical: Not on file  Tobacco Use  . Smoking status: Never Smoker  . Smokeless tobacco: Never Used  Substance and Sexual Activity  . Alcohol use: Yes    Comment: Once oer year around holidays  . Drug use: No  . Sexual activity: Yes  Lifestyle  . Physical activity:    Days per week: Not on file    Minutes per session: Not on file  . Stress: Not on file  Relationships  . Social connections:    Talks on phone: Not on file    Gets together: Not on file    Attends religious service: Not on file    Active member of club or organization: Not on file    Attends meetings of clubs or organizations: Not on file    Relationship status: Not on file  Other Topics Concern  . Not on file  Social History Narrative   Marital Status:  Married Merchant navy officer)    Children:  G2 Daughter (30) ([place]) Twins (Adam and Antiyonne)  Pets: Cat (01)    Living Situation: Lives with husband    Occupation:  School Principal -  Musician    Education:  Designer, jewellery in Education    Tobacco Use/Exposure:  None    Alcohol Use:  Occasional   Drug Use:  None   Diet:  Regular   Exercise:  None   Hobbies: Reading            Family History: The patient's family history includes Cancer in her maternal grandmother and maternal uncle; Diabetes in her father, mother, paternal aunt, paternal uncle, and sister; Hypertension in her maternal aunt and sister; Stroke in her mother. ROS:   Please see the history of present illness.    All 14 point review of systems negative except as described per history of present illness  EKGs/Labs/Other Studies Reviewed:      Recent Labs: 08/21/2017: TSH 5.22  Recent Lipid Panel No results found for: CHOL, TRIG, HDL, CHOLHDL, VLDL, LDLCALC,  LDLDIRECT  Physical Exam:    VS:  BP 126/64   Pulse 94   Ht '5\' 7"'$  (1.702 m)   Wt 204 lb 6.4 oz (92.7 kg)   SpO2 96%   BMI 32.01 kg/m     Wt Readings from Last 3 Encounters:  10/16/17 204 lb 6.4 oz (92.7 kg)  09/07/17 206 lb (93.4 kg)  08/13/17 209 lb (94.8 kg)     GEN:  Well nourished, well developed in no acute distress HEENT: Normal NECK: No JVD; No carotid bruits LYMPHATICS: No lymphadenopathy CARDIAC: RRR, no murmurs, no rubs, no gallops RESPIRATORY:  Clear to auscultation without rales, wheezing or rhonchi  ABDOMEN: Soft, non-tender, non-distended MUSCULOSKELETAL:  No edema; No deformity  SKIN: Warm and dry LOWER EXTREMITIES: no swelling NEUROLOGIC:  Alert and oriented x 3 PSYCHIATRIC:  Normal affect   ASSESSMENT:    1. Essential hypertension   2. Type 2 diabetes mellitus without complication, without long-term current use of insulin ([place])   3. Dyslipidemia   4. Palpitations    PLAN:    In order of problems listed above:  1. Palpitations: She wear Holter monitor which showed some PVCs and APCs but very small amount no sustained arrhythmia no couplets no triplets no bigeminy.  Echocardiogram showed preserved left ventricular ejection fraction.  Overall is a benign phenomenon I offer her small dose of beta-blocker and she accepted will give her half a tablet 25 mg of metoprolol twice daily with instruction to take it on as-needed basis 2. Type 2 diabetes: Doing well from that point review continue present management. 3. Dyslipidemia on statin which I will continue.  I see her back in my office in about 5 months or so she has a problem   Medication Adjustments/Labs and Tests Ordered: Current medicines are reviewed at length with the patient today.  Concerns regarding medicines are outlined above.  No orders of the defined types were placed in this encounter.  Medication changes: No orders of the defined types were placed in this encounter.   Signed, Park Liter, MD, [facility] 10/16/2017 4:42 PM    [place]

## 2017-11-18 ENCOUNTER — Other Ambulatory Visit: Payer: Self-pay | Admitting: Endocrinology

## 2017-11-18 DIAGNOSIS — E890 Postprocedural hypothyroidism: Secondary | ICD-10-CM

## 2017-11-24 ENCOUNTER — Other Ambulatory Visit: Payer: BC Managed Care – PPO

## 2017-11-25 ENCOUNTER — Other Ambulatory Visit (INDEPENDENT_AMBULATORY_CARE_PROVIDER_SITE_OTHER): Payer: BC Managed Care – PPO

## 2017-11-25 DIAGNOSIS — E890 Postprocedural hypothyroidism: Secondary | ICD-10-CM

## 2017-11-25 LAB — TSH: TSH: 0.32 u[IU]/mL — AB (ref 0.35–4.50)

## 2017-11-25 LAB — T4, FREE: Free T4: 1.06 ng/dL (ref 0.60–1.60)

## 2017-11-27 ENCOUNTER — Ambulatory Visit: Payer: BC Managed Care – PPO | Admitting: Endocrinology

## 2017-11-27 ENCOUNTER — Encounter: Payer: Self-pay | Admitting: Endocrinology

## 2017-11-27 VITALS — BP 124/82 | HR 84 | Ht 67.0 in | Wt 198.6 lb

## 2017-11-27 DIAGNOSIS — E890 Postprocedural hypothyroidism: Secondary | ICD-10-CM

## 2017-11-27 NOTE — Patient Instructions (Addendum)
Take 1/2 pill on Sundays and 1 on ther

## 2017-11-27 NOTE — Progress Notes (Signed)
Andrea Delacruz 57 y.o.             Reason for Appointment:  Hypothyroidism, followup visit   History of Present Illness:   Her hypothyroidism was first diagnosed  about 20 years ago after her I-131 treatment for hyperthyroidism The therapy that the patient has taken include brand name Synthroid.  In 11/14 because of a TSH of 0.16 her dose was reduced to 100 mcg  In 7/15 her TSH was low normal at 0.43  but in 1/16 her TSH had increased further to 5.1 At that time she was starting to feel more fatigue for the previous 3 months and also some hair loss  She was switched to 112 g once a day in 1/16 and she has been taking this fairly regularly in the morning  Recent history: On her visit in 8/17 she was complaining about increasing fatigue and achiness for about 3 months and also 5 pound weight gain. Because of her TSH being about 10 her Synthroid dose was increased from 112 to 137 g. She felt much better with this, however subsequently has required variable doses  Since 08/2017 she has been taking LEVOTHYROXINE generic 125 mcg daily and not clear when she was switched from brand name to generic  She has been compliant with her medication 15-30 minutes before breakfast consistently  Overall she feels fairly good with her energy level She is losing weight but she has been taking phentermine again from a weight loss clinic No shakiness or palpitations currently  She still continues to take biotin with no definite improvement in skin or hair  Her TSH which was 5.2 previously is now down to 0.32   Wt Readings from Last 3 Encounters:  11/27/17 198 lb 9.6 oz (90.1 kg)  10/16/17 204 lb 6.4 oz (92.7 kg)  09/07/17 206 lb (93.4 kg)    Labs:  Lab Results  Component Value Date   TSH 0.32 (L) 11/25/2017   TSH 5.22 (H) 08/21/2017   TSH 0.42 04/15/2017   FREET4 1.06 11/25/2017   FREET4 0.86 08/21/2017   FREET4 1.10 04/15/2017     Allergies as of 11/27/2017   No Known  Allergies     Medication List        Accurate as of 11/27/17  8:37 AM. Always use your most recent med list.          atorvastatin 40 MG tablet Commonly known as:  LIPITOR Take 40 mg by mouth daily.   Biotin 10000 MCG Tabs Take 1 tablet by mouth daily.   hydrochlorothiazide 25 MG tablet Commonly known as:  HYDRODIURIL   levothyroxine 125 MCG tablet Commonly known as:  SYNTHROID, LEVOTHROID Take 1 tablet (125 mcg total) by mouth daily.   lisinopril 10 MG tablet Commonly known as:  PRINIVIL,ZESTRIL Take 10 mg by mouth daily.   metFORMIN 500 MG tablet Commonly known as:  GLUCOPHAGE Take 500 mg by mouth daily with breakfast.   metoprolol tartrate 25 MG tablet Commonly known as:  LOPRESSOR Take 0.5 tablets (12.5 mg total) by mouth 2 (two) times daily as needed.   ONETOUCH VERIO test strip Generic drug:  glucose blood USE TO TEST BS DAILY   ONETOUCH VERIO w/Device Kit daily. use as directed   oxybutynin 5 MG tablet Commonly known as:  DITROPAN Take 5 mg by mouth 2 (two) times daily.   Potassium 99 MG Tabs Take 1 tablet by mouth daily.   Turmeric 500 MG Tabs Take 1 tablet  by mouth daily.   vitamin B-12 1000 MCG tablet Commonly known as:  CYANOCOBALAMIN Take 1,000 mcg by mouth daily.       Allergies: No Known Allergies  Past Medical History:  Diagnosis Date  . Hypertension   . Obesity   . Thyroid disease    Hyperactive then was treated with iodine.     Past Surgical History:  Procedure Laterality Date  . BREAST REDUCTION SURGERY  [id number]    Family History  Problem Relation Age of Onset  . Stroke Mother   . Diabetes Mother   . Diabetes Father   . Diabetes Sister   . Hypertension Sister   . Hypertension Maternal Aunt   . Cancer Maternal Uncle   . Diabetes Paternal Aunt   . Diabetes Paternal Uncle   . Cancer Maternal Grandmother     Social History:  reports that she has never smoked. She has never used smokeless tobacco. She reports that  she drinks alcohol. She reports that she does not use drugs.  REVIEW Of SYSTEMS:  Weight range:   She says she is going to a weight loss clinic and they are giving her phentermine 37.5 mg which is not on her medication list She is followed monthly She says that she is supposed to be on this for about 6 months Apparently her blood pressure is monitored regularly at the clinic  Wt Readings from Last 3 Encounters:  11/27/17 198 lb 9.6 oz (90.1 kg)  10/16/17 204 lb 6.4 oz (92.7 kg)  09/07/17 206 lb (93.4 kg)   History of hypertension on a 2 drug regimen, followed by PCP     Examination:   BP 124/82 (BP Location: Left Arm, Patient Position: Sitting, Cuff Size: Normal)   Pulse 84   Ht '5\' 7"'$  (1.702 m)   Wt 198 lb 9.6 oz (90.1 kg)   SpO2 98%   BMI 31.11 kg/m   She looks well. Skin appears normal No tremor  Biceps reflexes appear normal    Assessment/Plan   Hypothyroidism, post ablative  and long-standing.  She has had variable requirements previously for her thyroid supplement even with taking brand name Synthroid Now taking generic levothyroxine and requiring relatively higher dose of 125 mcg daily  She does take her levothyroxine consistently as directed before breakfast and without any interacting vitamins Subjectively doing well  Since her TSH is low normal at 0.32 she will reduce her dose on Sundays to half a tablet but keep on her same prescription If her TSH is fluctuating again we will switch her back to brand-name Synthroid   DIABETES on metformin alone: Last A1c was 7 and she is following up with her PCP  Weight loss: She is on phentermine and she wants to continue this for a total duration of 6 months  We will recheck her labs in 6 weeks  Andrea Delacruz 11/27/2017, 8:37 AM

## 2018-01-08 ENCOUNTER — Other Ambulatory Visit (INDEPENDENT_AMBULATORY_CARE_PROVIDER_SITE_OTHER): Payer: BC Managed Care – PPO

## 2018-01-08 DIAGNOSIS — E890 Postprocedural hypothyroidism: Secondary | ICD-10-CM

## 2018-01-08 LAB — TSH: TSH: 1.51 u[IU]/mL (ref 0.35–4.50)

## 2018-01-11 NOTE — Progress Notes (Signed)
Please call to let patient know that the thyroid results are normal and no change in dosage needed

## 2018-01-23 ENCOUNTER — Other Ambulatory Visit: Payer: Self-pay | Admitting: Family Medicine

## 2018-01-23 DIAGNOSIS — R7989 Other specified abnormal findings of blood chemistry: Secondary | ICD-10-CM

## 2018-01-23 DIAGNOSIS — R945 Abnormal results of liver function studies: Principal | ICD-10-CM

## 2018-02-02 ENCOUNTER — Ambulatory Visit
Admission: RE | Admit: 2018-02-02 | Discharge: 2018-02-02 | Disposition: A | Discharge status: Home / Self Care | Payer: BC Managed Care – PPO | Source: Ambulatory Visit | Attending: Family Medicine | Admitting: Family Medicine

## 2018-02-02 DIAGNOSIS — R7989 Other specified abnormal findings of blood chemistry: Secondary | ICD-10-CM

## 2018-02-02 DIAGNOSIS — R945 Abnormal results of liver function studies: Principal | ICD-10-CM

## 2018-02-08 ENCOUNTER — Other Ambulatory Visit: Payer: BC Managed Care – PPO

## 2018-02-12 ENCOUNTER — Ambulatory Visit: Payer: BC Managed Care – PPO | Admitting: Endocrinology

## 2018-05-17 ENCOUNTER — Other Ambulatory Visit: Payer: Self-pay | Admitting: Endocrinology

## 2018-05-17 ENCOUNTER — Other Ambulatory Visit (INDEPENDENT_AMBULATORY_CARE_PROVIDER_SITE_OTHER): Payer: BC Managed Care – PPO

## 2018-05-17 DIAGNOSIS — E890 Postprocedural hypothyroidism: Secondary | ICD-10-CM

## 2018-05-17 LAB — T4, FREE: Free T4: 1.15 ng/dL (ref 0.60–1.60)

## 2018-05-17 LAB — TSH: TSH: 0.35 u[IU]/mL (ref 0.35–4.50)

## 2018-05-20 ENCOUNTER — Encounter: Payer: Self-pay | Admitting: Endocrinology

## 2018-05-20 ENCOUNTER — Ambulatory Visit: Payer: BC Managed Care – PPO | Admitting: Endocrinology

## 2018-05-20 VITALS — BP 104/60 | HR 82 | Ht 67.0 in | Wt 200.8 lb

## 2018-05-20 DIAGNOSIS — E890 Postprocedural hypothyroidism: Secondary | ICD-10-CM

## 2018-05-20 DIAGNOSIS — E119 Type 2 diabetes mellitus without complications: Secondary | ICD-10-CM | POA: Diagnosis not present

## 2018-05-20 MED ORDER — SYNTHROID 125 MCG PO TABS: ORAL_TABLET | ORAL | 3 refills | Status: DC

## 2018-05-20 NOTE — Progress Notes (Signed)
Andrea Delacruz 58 y.o.             Reason for Appointment:  Hypothyroidism, followup visit   History of Present Illness:   Her hypothyroidism was first diagnosed  about 20 years ago after her I-131 treatment for hyperthyroidism The therapy that the patient has taken include brand name Synthroid.  In 11/14 because of a TSH of 0.16 her dose was reduced to 100 mcg  In 7/15 her TSH was low normal at 0.43  but in 1/16 her TSH had increased further to 5.1 At that time she was starting to feel more fatigue for the previous 3 months and also some hair loss  She was switched to 112 g once a day in 1/16 and she has been taking this fairly regularly in the morning  Recent history: On her visit in 8/17 she was complaining about increasing fatigue and achiness for about 3 months and also 5 pound weight gain. Because of her TSH being about 10 her Synthroid dose was increased from 112 to 137 g. She felt much better with this, however subsequently has required variable doses  Since 08/2017 she has been taking 125 mcg daily and not clear whether she is taking Synthroid brand or generic She said that she is recently getting 2 different colors of tablets in the same prescription from her [facility] pharmacy On her last visit she was told to reduce her dose by half tablet weekly With this her TSH was not suppressed  She has been compliant with her medication 15-30 minutes before breakfast consistently  Recently she feels fairly good with her energy level No shakiness or palpitations heat or cold intolerance Overall her weight is about the same  Currently not taking biotin which probably had not helped her here anyway  Her TSH which was 1.5 in August is slightly lower at 0.35   Wt Readings from Last 3 Encounters:  05/20/18 200 lb 12.8 oz (91.1 kg)  11/27/17 198 lb 9.6 oz (90.1 kg)  10/16/17 204 lb 6.4 oz (92.7 kg)    Labs:  Lab Results  Component Value Date   TSH 0.35 05/17/2018   TSH 1.51  01/08/2018   TSH 0.32 (L) 11/25/2017   FREET4 1.15 05/17/2018   FREET4 1.06 11/25/2017   FREET4 0.86 08/21/2017     Allergies as of 05/20/2018   No Known Allergies     Medication List       Accurate as of May 20, 2018  9:03 AM. Always use your most recent med list.        atorvastatin 40 MG tablet Commonly known as:  LIPITOR Take 40 mg by mouth daily.   Biotin 10000 MCG Tabs Take 1 tablet by mouth daily.   hydrochlorothiazide 25 MG tablet Commonly known as:  HYDRODIURIL   levothyroxine 125 MCG tablet Commonly known as:  SYNTHROID, LEVOTHROID Take 1 tablet (125 mcg total) by mouth daily.   lisinopril 10 MG tablet Commonly known as:  PRINIVIL,ZESTRIL Take 10 mg by mouth daily.   metFORMIN 500 MG tablet Commonly known as:  GLUCOPHAGE Take 500 mg by mouth daily with breakfast.   ONETOUCH VERIO test strip Generic drug:  glucose blood USE TO TEST BS DAILY   ONETOUCH VERIO w/Device Kit daily. use as directed   oxybutynin 5 MG tablet Commonly known as:  DITROPAN Take 5 mg by mouth 2 (two) times daily.   Potassium 99 MG Tabs Take 1 tablet by mouth daily.   Turmeric  500 MG Tabs Take 1 tablet by mouth daily.   vitamin B-12 1000 MCG tablet Commonly known as:  CYANOCOBALAMIN Take 1,000 mcg by mouth daily.   Vitamin D3 100000 UNIT/GM Powd by Does not apply route.       Allergies: No Known Allergies  Past Medical History:  Diagnosis Date  . Hypertension   . Obesity   . Thyroid disease    Hyperactive then was treated with iodine.     Past Surgical History:  Procedure Laterality Date  . BREAST REDUCTION SURGERY  [id number]    Family History  Problem Relation Age of Onset  . Stroke Mother   . Diabetes Mother   . Diabetes Father   . Diabetes Sister   . Hypertension Sister   . Hypertension Maternal Aunt   . Cancer Maternal Uncle   . Diabetes Paternal Aunt   . Diabetes Paternal Uncle   . Cancer Maternal Grandmother     Social History:   reports that she has never smoked. She has never used smokeless tobacco. She reports current alcohol use. She reports that she does not use drugs.  REVIEW Of SYSTEMS:  Weight range:   Again is going to a weight loss clinic and they are giving her phentermine 37.5 mg on and off She is followed regularly apparently  Apparently her blood pressure is monitored regularly at the clinic and is not reportedly high  Wt Readings from Last 3 Encounters:  05/20/18 200 lb 12.8 oz (91.1 kg)  11/27/17 198 lb 9.6 oz (90.1 kg)  10/16/17 204 lb 6.4 oz (92.7 kg)   History of hypertension on a 2 drug regimen, followed by PCP  BP Readings from Last 3 Encounters:  05/20/18 104/60  11/27/17 124/82  10/16/17 126/64   DIABETES: She is taking only 1 tablet of metformin from her PCP and her last A1c was 6.8    Examination:   BP 104/60 (BP Location: Right Arm, Patient Position: Sitting, Cuff Size: Normal)   Pulse 82   Ht _0  (1.702 m)   Wt 200 lb 12.8 oz (91.1 kg)   SpO2 97%   BMI 31.45 kg/m   She looks well. No peripheral edema     Assessment/Plan   Hypothyroidism, post ablative  and long-standing.  She has had variable requirements previously for her thyroid supplement even with taking brand name Synthroid Although she is taking half tablet less per week compared to her last visit her TSH is again fluctuating somewhat and now low normal at 0.35 However she is asymptomatic  Currently not clear if she is getting brand-name Synthroid as she is apparently has 2 different types of pills in her current bottle She is very compliant with taking her prescription regularly She will check with her pharmacy and let us know what exactly she is getting Recommend continue on brand-name Synthroid and co-pay card given  DIABETES on metformin alone: Last A1c was 6.8 If she wants to be seen for her diabetes here she will discuss with her PCP  Weight loss: She is on phentermine on and off from the  bariatric clinic  She will follow-up in 6 months  Garion Wempe 05/20/2018, 9:03 AM

## 2018-06-25 ENCOUNTER — Emergency Department (HOSPITAL_COMMUNITY)
Admission: EM | Admit: 2018-06-25 | Discharge: 2018-06-25 | Disposition: A | Discharge status: Home / Self Care | Payer: BC Managed Care – PPO | Attending: Emergency Medicine | Admitting: Emergency Medicine

## 2018-06-25 ENCOUNTER — Encounter (HOSPITAL_COMMUNITY): Payer: Self-pay

## 2018-06-25 ENCOUNTER — Emergency Department (HOSPITAL_COMMUNITY): Payer: BC Managed Care – PPO

## 2018-06-25 DIAGNOSIS — E039 Hypothyroidism, unspecified: Secondary | ICD-10-CM | POA: Diagnosis not present

## 2018-06-25 DIAGNOSIS — R079 Chest pain, unspecified: Secondary | ICD-10-CM | POA: Diagnosis present

## 2018-06-25 DIAGNOSIS — I10 Essential (primary) hypertension: Secondary | ICD-10-CM | POA: Diagnosis not present

## 2018-06-25 DIAGNOSIS — Z7984 Long term (current) use of oral hypoglycemic drugs: Secondary | ICD-10-CM | POA: Diagnosis not present

## 2018-06-25 DIAGNOSIS — R0789 Other chest pain: Secondary | ICD-10-CM | POA: Diagnosis not present

## 2018-06-25 DIAGNOSIS — E119 Type 2 diabetes mellitus without complications: Secondary | ICD-10-CM | POA: Insufficient documentation

## 2018-06-25 DIAGNOSIS — Z79899 Other long term (current) drug therapy: Secondary | ICD-10-CM | POA: Insufficient documentation

## 2018-06-25 HISTORY — DX: Type 2 diabetes mellitus without complications: E11.9

## 2018-06-25 LAB — BASIC METABOLIC PANEL
ANION GAP: 5 (ref 5–15)
BUN: 11 mg/dL (ref 6–20)
CHLORIDE: 109 mmol/L (ref 98–111)
CO2: 27 mmol/L (ref 22–32)
Calcium: 9.7 mg/dL (ref 8.9–10.3)
Creatinine, Ser: 0.81 mg/dL (ref 0.44–1.00)
GFR calc non Af Amer: >60 mL/min (ref >60)
Glucose, Bld: 83 mg/dL (ref 70–99)
POTASSIUM: 3.6 mmol/L (ref 3.5–5.1)
SODIUM: 141 mmol/L (ref 135–145)

## 2018-06-25 LAB — CBC
HEMATOCRIT: 36.2 % (ref 36.0–46.0)
HEMOGLOBIN: 11.2 g/dL — AB (ref 12.0–15.0)
MCH: 28.4 pg (ref 26.0–34.0)
MCHC: 30.9 g/dL (ref 30.0–36.0)
MCV: 91.6 fL (ref 80.0–100.0)
NRBC: 0 % (ref 0.0–0.2)
Platelets: 252 10*3/uL (ref 150–400)
RBC: 3.95 MIL/uL (ref 3.87–5.11)
RDW: 14.6 % (ref 11.5–15.5)
WBC: 7.5 10*3/uL (ref 4.0–10.5)

## 2018-06-25 LAB — I-STAT TROPONIN, ED
TROPONIN I, POC: 0 ng/mL (ref 0.00–0.08)
Troponin i, poc: 0 ng/mL (ref 0.00–0.08)

## 2018-06-25 LAB — I-STAT BETA HCG BLOOD, ED (MC, WL, AP ONLY): I-stat hCG, quantitative: <5 m[IU]/mL (ref <5)

## 2018-06-25 MED ORDER — SODIUM CHLORIDE 0.9% FLUSH: 3.0000 mL | Freq: Once | INTRAVENOUS | Status: DC

## 2018-06-25 NOTE — Discharge Instructions (Signed)
Please read instructions below. Return to the ER for new or worsening symptoms; including worsening chest pain, shortness of breath, pain that radiates to the arm or neck, pain or shortness of breath worsened with exertion. Follow up with your primary care provider on Monday to discuss your visit today.

## 2018-06-25 NOTE — ED Provider Notes (Signed)
[facility]-EMERGENCY DEPT Provider Note   CSN: [id number] Arrival date & time: 06/25/18  1444     History   Chief Complaint Chief Complaint  Patient presents with  . Chest Pain    HPI Andrea Delacruz is a 58 y.o. female with past medical history of type 2 diabetes, hyperlipidemia, hypertension, hypothyroidism, presenting to the emergency department with complaint of intermittent chest pain.  Patient states 1 week ago she had an episode of chest pain that began at rest, described as a heaviness in her central chest.  No associated symptoms.  Symptoms resolved after 15 to 20 minutes without intervention.  She states she was symptom-free until this morning.  States that she was lying in bed she had the similar heaviness feeling.  Again had no associated symptoms.  Symptoms resolved without intervention, however recurred 2 more times today with last occurrence at 2 PM.  This afternoon the pain began radiating to her left jaw.  No interventions tried.  No personal cardiac history, however patient's grandfather died from MI prior to the age of 36.  Patient has not had a stress test done in the past.  The history is provided by the patient.    Past Medical History:  Diagnosis Date  . Diabetes mellitus without complication (HCC)   . Hypertension   . Obesity   . Thyroid disease    Hyperactive then was treated with iodine.     Patient Active Problem List   Diagnosis Date Noted  . Palpitations 09/07/2017  . Type 2 diabetes mellitus without complication, without long-term current use of insulin (HCC) 09/07/2017  . Dyslipidemia 09/07/2017  . Hypothyroidism, postradioiodine therapy 06/21/2014  . 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 Obesity, unspecified 10/11/2012  . Essential hypertension 10/11/2012    Past Surgical History:  Procedure Laterality Date  . BREAST REDUCTION SURGERY  [id number]     OB History   No obstetric history on file.      Home  Medications    Prior to Admission medications   Medication Sig Start Date End Date Taking? Authorizing Provider  atorvastatin (LIPITOR) 40 MG tablet Take 40 mg by mouth daily. 03/16/17  Yes [provider]  cholecalciferol (VITAMIN D3) 25 MCG (1000 UT) tablet Take 1,000 Units by mouth daily.   Yes [provider]  hydrochlorothiazide (HYDRODIURIL) 25 MG tablet Take 25 mg by mouth daily.  06/28/12  Yes [provider]  lisinopril (PRINIVIL,ZESTRIL) 10 MG tablet Take 10 mg by mouth daily.   Yes [provider]  metFORMIN (GLUCOPHAGE) 500 MG tablet Take 500 mg by mouth daily with breakfast.  04/12/17  Yes [provider]  oxybutynin (DITROPAN) 5 MG tablet Take 5 mg by mouth daily.    Yes [provider]  Potassium 99 MG TABS Take 1 tablet by mouth daily.   Yes [provider]  SYNTHROID 125 MCG tablet 1 tablet with water before breakfast daily except half tablet on Sundays 05/20/18  Yes Reather Littler, MD  Turmeric 500 MG TABS Take 1 tablet by mouth daily.   Yes [provider]  vitamin B-12 (CYANOCOBALAMIN) 1000 MCG tablet Take 1,000 mcg by mouth daily.   Yes [provider]    Family History Family History  Problem Relation Age of Onset  . Stroke Mother   . Diabetes Mother   . Diabetes Father   . Diabetes Sister   . Hypertension Sister   . Hypertension Maternal Aunt   . Cancer  Maternal Uncle   . Diabetes Paternal Aunt   . Diabetes Paternal Uncle   . Cancer Maternal Grandmother     Social History Social History   Tobacco Use  . Smoking status: Never Smoker  . Smokeless tobacco: Never Used  Substance Use Topics  . Alcohol use: Yes    Comment: Once oer year around holidays  . Drug use: No     Allergies   Patient has no known allergies.   Review of Systems Review of Systems  Cardiovascular: Positive for chest pain.  All other systems reviewed and are negative.    Physical Exam Updated Vital  Signs BP 129/71   Pulse 67   Temp 98.7 F (37.1 C) (Oral)   Resp 16   SpO2 100%   Physical Exam Vitals signs and nursing note reviewed.  Constitutional:      Appearance: She is well-developed.  HENT:     Head: Normocephalic and atraumatic.  Eyes:     Conjunctiva/sclera: Conjunctivae normal.  Neck:     Musculoskeletal: Normal range of motion and neck supple.  Cardiovascular:     Rate and Rhythm: Normal rate and regular rhythm.  Pulmonary:     Effort: Pulmonary effort is normal. No respiratory distress.     Breath sounds: Normal breath sounds.  Abdominal:     General: Bowel sounds are normal. There is no distension.     Palpations: Abdomen is soft.     Tenderness: There is no abdominal tenderness. There is no guarding.  Musculoskeletal:     Right lower leg: No edema.     Left lower leg: No edema.  Skin:    General: Skin is warm.  Neurological:     Mental Status: She is alert.  Psychiatric:        Behavior: Behavior normal.      ED Treatments / Results  Labs (all labs ordered are listed, but only abnormal results are displayed) Labs Reviewed  CBC - Abnormal; Notable for the following components:      Result Value   Hemoglobin 11.2 (*)    All other components within normal limits  BASIC METABOLIC PANEL  I-STAT BETA HCG BLOOD, ED ([facility], [facility], [facility] ONLY)  I-STAT TROPONIN, ED  I-STAT TROPONIN, ED    EKG EKG Interpretation  Date/Time:  Friday June 25 2018 14:51:49 EST Ventricular Rate:  77 PR Interval:    QRS Duration: 86 QT Interval:  381 QTC Calculation: 432 R Axis:   26 Text Interpretation:  Sinus rhythm Abnormal R-wave progression, early transition No STEMI.  Confirmed by Alona BeneLong, Joshua [phone]([id number]) on 06/25/2018 3:48:39 PM   Radiology Dg Chest 2 View  Result Date: 06/25/2018 CLINICAL DATA:  Intermittent chest pain. EXAM: CHEST - 2 VIEW COMPARISON:  Chest x-ray dated April 11, 2013. FINDINGS: The heart size and mediastinal contours are within normal limits.  Both lungs are clear. The visualized skeletal structures are unremarkable. IMPRESSION: No active cardiopulmonary disease. Electronically Signed   By: Obie DredgeWilliam T Derry M.D.   On: 06/25/2018 17:16    Procedures Procedures (including critical care time)  Medications Ordered in ED Medications  sodium chloride flush (NS) 0.9 % injection 3 mL (has no administration in time range)     Initial Impression / Assessment and Plan / ED Course  I have reviewed the triage vital signs and the nursing notes.  Pertinent labs & imaging results that were available during my care of the patient were reviewed by me and considered in my medical  decision making (see chart for details).  Clinical Course as of Jun 25 1898  Fri Jun 25, 2018  1700 Had shared decision making with patient regarding plan.  So far work-up is reassuring.  Patient with low to moderate heart score given risk factors, however presentation is atypical.  Patient prefers to check delta troponin and follow-up closely with her PCP.  She is [facility] and seems reliable for follow-up.    [JR]    Clinical Course User Index [JR] , [place] N, PA-C    Pt presenting with intermittent chest heaviness, at rest, resolving without intervention. No JVD or new murmur, RRR, breath sounds equal bilaterally, EKG without acute abnormalities, and negative CXR. Presentation is atypical, however pt has risk factors for CAD. Had shared decision making regarding risk and low-moderate heart score. Pt elected delta trop and outpatient follow up with PCP. Pt seems reliable for follow up, believe this is reasonable. Neg trop x2, neg delta trop. Chest pain is not likely of pulmonary etiology d/t presentation,low risk wells, VSS, no tracheal deviation.  Patient is to be discharged with recommendation to follow up with PCP in regards to today's hospital visit. Pt has been advised to return to the ED if CP becomes exertional, associated with  diaphoresis or nausea, radiates to left jaw/arm, worsens or becomes concerning in any way. Pt appears reliable for follow up and is agreeable to discharge.   Patient discussed with Dr. Jacqulyn BathLong who agrees with care plan  Discussed results, findings, treatment and follow up. Patient advised of return precautions. Patient verbalized understanding and agreed with plan.   Final Clinical Impressions(s) / ED Diagnoses   Final diagnoses:  Atypical chest pain    ED Discharge Orders    None       , [place] N, PA-C 06/25/18 1900    Mancel BaleWentz, Elliott, MD 06/26/18 1422

## 2018-06-25 NOTE — ED Triage Notes (Signed)
C/O chest pain that she first noticed last week. Chest pain comes and goes. Patient states chest pain happened again this morning.   Central chest pain that feels like something is sitting on her chest and can feel it through her back.  8/10 pain when is comes.   No pain at this time.   A/Ox4 Ambulatory in triage and able to speak in complete sentences with no trouble.   Denies dizziness, n/v, or weakness with chest pain.

## 2018-07-13 ENCOUNTER — Other Ambulatory Visit: Payer: Self-pay | Admitting: Family Medicine

## 2018-07-13 DIAGNOSIS — Z1231 Encounter for screening mammogram for malignant neoplasm of breast: Secondary | ICD-10-CM

## 2018-08-17 ENCOUNTER — Ambulatory Visit: Payer: BC Managed Care – PPO

## 2018-08-31 ENCOUNTER — Telehealth: Payer: Self-pay | Admitting: Endocrinology

## 2018-08-31 NOTE — Telephone Encounter (Signed)
Patient called stated that she believes her Thyroid levels are off and that Dr Lucianne Muss told her that if started feeling this was she needed to call and let him know so that he could order labs for you.  Please review and advise patient on how to proceed

## 2018-08-31 NOTE — Telephone Encounter (Signed)
Please advise 

## 2018-08-31 NOTE — Telephone Encounter (Signed)
Labs are already drawn, please review the chart.  Only free T4 and TSH need to be drawn

## 2018-08-31 NOTE — Telephone Encounter (Signed)
Okay to come in for labs and we can do a telehealth review of the labs

## 2018-08-31 NOTE — Telephone Encounter (Signed)
PT scheduled for labs tomorrow and doxy visit Friday. Please order all labs you would like drawn.

## 2018-09-01 ENCOUNTER — Other Ambulatory Visit: Payer: Self-pay

## 2018-09-01 ENCOUNTER — Other Ambulatory Visit (INDEPENDENT_AMBULATORY_CARE_PROVIDER_SITE_OTHER): Payer: BC Managed Care – PPO

## 2018-09-01 DIAGNOSIS — E890 Postprocedural hypothyroidism: Secondary | ICD-10-CM | POA: Diagnosis not present

## 2018-09-01 LAB — T4, FREE: Free T4: 0.85 ng/dL (ref 0.60–1.60)

## 2018-09-01 LAB — TSH: TSH: 4.22 u[IU]/mL (ref 0.35–4.50)

## 2018-09-03 ENCOUNTER — Encounter: Payer: Self-pay | Admitting: Endocrinology

## 2018-09-03 ENCOUNTER — Ambulatory Visit (INDEPENDENT_AMBULATORY_CARE_PROVIDER_SITE_OTHER): Payer: BC Managed Care – PPO | Admitting: Endocrinology

## 2018-09-03 ENCOUNTER — Other Ambulatory Visit: Payer: Self-pay

## 2018-09-03 DIAGNOSIS — R5383 Other fatigue: Secondary | ICD-10-CM

## 2018-09-03 DIAGNOSIS — E890 Postprocedural hypothyroidism: Secondary | ICD-10-CM

## 2018-09-03 NOTE — Progress Notes (Signed)
Andrea Delacruz 58 y.o.             Today's office visit was provided via telemedicine using video technique Explained to the patient and the the limitations of evaluation and management by telemedicine and the availability of in person appointments.  The patient understood the limitations and agreed to proceed. Patient also understood that the telehealth visit is billable. . Location of the patient: Home . Location of the provider: Office Only the patient and myself were participating in the encounter    Reason for Appointment:  Hypothyroidism, followup visit   History of Present Illness:   Her hypothyroidism was first diagnosed  about 20 years ago after her I-131 treatment for hyperthyroidism The therapy that the patient has taken include brand name Synthroid.  In 11/14 because of a TSH of 0.16 her dose was reduced to 100 mcg  In 7/15 her TSH was low normal at 0.43  but in 1/16 her TSH had increased further to 5.1 At that time she was starting to feel more fatigue for the previous 3 months and also some hair loss  She was switched to 112 g once a day in 1/16 and she has been taking this fairly regularly in the morning  Recent history: On her visit in 8/17 with TSH being about 10 her Synthroid dose was increased from 112 to 137 g. She felt much better with this, however subsequently has required variable doses  Since 08/2017 she has been taking 125 mcg  She had called recently to complain about feeling more tired She says that at the end of the day she is not able to do much as she is exhausted However history indicates that she is having late insomnia getting up at 3:30 AM frequently  She has been compliant with her Synthroid 15-30 minutes before breakfast consistently She confirmed that she is taking brand name Synthroid which is covered by her insurance  On her last visit in 05/2018 although her TSH was low normal since she was feeling fairly good with no symptoms of  excessive thyroid hormone her regimen was continued unchanged She has not missed any doses lately  She is taking 6-1/2 tablets a week now  Her TSH is now on the same dose higher at 4.2    Wt Readings from Last 3 Encounters:  05/20/18 200 lb 12.8 oz (91.1 kg)  11/27/17 198 lb 9.6 oz (90.1 kg)  10/16/17 204 lb 6.4 oz (92.7 kg)    Labs:  Lab Results  Component Value Date   TSH 4.22 09/01/2018   TSH 0.35 05/17/2018   TSH 1.51 01/08/2018   FREET4 0.85 09/01/2018   FREET4 1.15 05/17/2018   FREET4 1.06 11/25/2017     Allergies as of 09/03/2018   No Known Allergies     Medication List       Accurate as of September 03, 2018 10:35 AM. Always use your most recent med list.        atorvastatin 40 MG tablet Commonly known as:  LIPITOR Take 40 mg by mouth daily.   cholecalciferol 25 MCG (1000 UT) tablet Commonly known as:  VITAMIN D3 Take 1,000 Units by mouth daily.   hydrochlorothiazide 25 MG tablet Commonly known as:  HYDRODIURIL Take 25 mg by mouth daily.   lisinopril 10 MG tablet Commonly known as:  ZESTRIL Take 10 mg by mouth daily.   metFORMIN 500 MG tablet Commonly known as:  GLUCOPHAGE Take 500 mg by mouth daily with  breakfast.   oxybutynin 5 MG tablet Commonly known as:  DITROPAN Take 5 mg by mouth daily.   Potassium 99 MG Tabs Take 1 tablet by mouth daily.   Synthroid 125 MCG tablet Generic drug:  levothyroxine 1 tablet with water before breakfast daily except half tablet on Sundays   Turmeric 500 MG Tabs Take 1 tablet by mouth daily.   vitamin B-12 1000 MCG tablet Commonly known as:  CYANOCOBALAMIN Take 1,000 mcg by mouth daily.       Allergies: No Known Allergies  Past Medical History:  Diagnosis Date  . Diabetes mellitus without complication (HCC)   . Hypertension   . Obesity   . Thyroid disease    Hyperactive then was treated with iodine.     Past Surgical History:  Procedure Laterality Date  . BREAST REDUCTION SURGERY  [id number]     Family History  Problem Relation Age of Onset  . Stroke Mother   . Diabetes Mother   . Diabetes Father   . Diabetes Sister   . Hypertension Sister   . Hypertension Maternal Aunt   . Cancer Maternal Uncle   . Diabetes Paternal Aunt   . Diabetes Paternal Uncle   . Cancer Maternal Grandmother     Social History:  reports that she has never smoked. She has never used smokeless tobacco. She reports current alcohol use. She reports that she does not use drugs.  REVIEW Of SYSTEMS:  Weight range:   Again is going to a weight loss clinic and they are giving her phentermine 37.5 mg on and off She is followed regularly apparently  Apparently her blood pressure is monitored regularly at the clinic   Wt Readings from Last 3 Encounters:  05/20/18 200 lb 12.8 oz (91.1 kg)  11/27/17 198 lb 9.6 oz (90.1 kg)  10/16/17 204 lb 6.4 oz (92.7 kg)   History of hypertension on a 2 drug regimen, followed by PCP  BP Readings from Last 3 Encounters:  06/25/18 132/75  05/20/18 104/60  11/27/17 124/82   DIABETES: She is taking 1 tablet of 500 mg metformin from her PCP and her last A1c was 6.8 in 04/2018 She says she is trying to continue exercise recently   Examination:   There were no vitals taken for this visit.      Assessment/Plan   Hypothyroidism, post ablative  and long-standing.  She has had variable requirements for her thyroid supplement even with taking brand name Synthroid  Even with the same dose her TSH is now 4.2 compared to 0.35 previously She is very consistent with taking her levothyroxine with water before breakfast every day  Her main complaint is significant fatigue and exhaustion but this is unlikely to be from hypothyroidism Most likely her fatigue is related to her late insomnia, stress that she has been having  However since TSH is upper normal she can take 1 tablet daily instead of 6-1/2/week with the brand-name Synthroid 125 mcg  DIABETES on metformin  alone: Last A1c was 6.8 She is going to follow-up with her PCP As mentioned before she can be seen here if needed if A1c is going up  Follow-up in about 2 months  Reather Littler 09/03/2018, 10:35 AM

## 2018-09-11 ENCOUNTER — Other Ambulatory Visit: Payer: Self-pay | Admitting: Endocrinology

## 2018-10-06 ENCOUNTER — Ambulatory Visit
Admission: RE | Admit: 2018-10-06 | Discharge: 2018-10-06 | Disposition: A | Discharge status: Home / Self Care | Payer: BC Managed Care – PPO | Source: Ambulatory Visit | Attending: Family Medicine | Admitting: Family Medicine

## 2018-10-06 ENCOUNTER — Other Ambulatory Visit: Payer: Self-pay

## 2018-10-06 DIAGNOSIS — Z1231 Encounter for screening mammogram for malignant neoplasm of breast: Secondary | ICD-10-CM

## 2018-11-08 ENCOUNTER — Other Ambulatory Visit: Payer: Self-pay

## 2018-11-08 ENCOUNTER — Other Ambulatory Visit (INDEPENDENT_AMBULATORY_CARE_PROVIDER_SITE_OTHER): Payer: BC Managed Care – PPO

## 2018-11-08 DIAGNOSIS — E890 Postprocedural hypothyroidism: Secondary | ICD-10-CM | POA: Diagnosis not present

## 2018-11-08 DIAGNOSIS — E119 Type 2 diabetes mellitus without complications: Secondary | ICD-10-CM | POA: Diagnosis not present

## 2018-11-08 LAB — T4, FREE: Free T4: 0.95 ng/dL (ref 0.60–1.60)

## 2018-11-08 LAB — TSH: TSH: 7.78 u[IU]/mL — ABNORMAL HIGH (ref 0.35–4.50)

## 2018-11-08 LAB — HEMOGLOBIN A1C: Hgb A1c MFr Bld: 7.3 % — ABNORMAL HIGH (ref 4.6–6.5)

## 2018-11-11 ENCOUNTER — Encounter: Payer: Self-pay | Admitting: Endocrinology

## 2018-11-11 ENCOUNTER — Other Ambulatory Visit: Payer: Self-pay

## 2018-11-11 ENCOUNTER — Ambulatory Visit (INDEPENDENT_AMBULATORY_CARE_PROVIDER_SITE_OTHER): Payer: BC Managed Care – PPO | Admitting: Endocrinology

## 2018-11-11 DIAGNOSIS — E890 Postprocedural hypothyroidism: Secondary | ICD-10-CM

## 2018-11-11 MED ORDER — SYNTHROID 137 MCG PO TABS: 137.0000 ug | ORAL_TABLET | Freq: Every day | ORAL | 2 refills | Status: DC

## 2018-11-11 NOTE — Progress Notes (Signed)
Andrea Delacruz 58 y.o.             Today's office visit was provided via telemedicine using video technique Explained to the patient and the the limitations of evaluation and management by telemedicine and the availability of in person appointments.  The patient understood the limitations and agreed to proceed. Patient also understood that the telehealth visit is billable. . Location of the patient: Home . Location of the provider: Office Only the patient and myself were participating in the encounter    Reason for Appointment:  Hypothyroidism, followup visit   History of Present Illness:   Her hypothyroidism was first diagnosed  about 20 years ago after her I-131 treatment for hyperthyroidism The therapy that the patient has taken include brand name Synthroid.  In 11/14 because of a TSH of 0.16 her dose was reduced to 100 mcg  In 7/15 her TSH was low normal at 0.43  but in 1/16 her TSH had increased further to 5.1 At that time she was starting to feel more fatigue for the previous 3 months and also some hair loss  She was switched to 112 g once a day in 1/16 and she has been taking this fairly regularly in the morning  Recent history: On her visit in 8/17 with TSH being about 10 her Synthroid dose was increased from 112 to 137 g. She felt much better with this, however subsequently has required variable doses  Since 08/2017 she has been taking 125 mcg  On her last visit in 08/2018 she was complaining of fatigue but was also having insomnia She was told to increase her dosage by half tablet weekly since her TSH was 4.2  She has been compliant with her brand-name Synthroid 15-30 minutes before breakfast daily She has not started any new vitamins or calcium in the morning Also has not missed any doses  Currently she is not complaining of any unusual fatigue although she has gained about 8 pounds and she does not know why No cold intolerance  Her TSH appears to be higher  at 7.8   Wt Readings from Last 3 Encounters:  05/20/18 200 lb 12.8 oz (91.1 kg)  11/27/17 198 lb 9.6 oz (90.1 kg)  10/16/17 204 lb 6.4 oz (92.7 kg)    Labs:  Lab Results  Component Value Date   TSH 7.78 (H) 11/08/2018   TSH 4.22 09/01/2018   TSH 0.35 05/17/2018   FREET4 0.95 11/08/2018   FREET4 0.85 09/01/2018   FREET4 1.15 05/17/2018     Allergies as of 11/11/2018   No Known Allergies     Medication List       Accurate as of November 11, 2018  8:57 AM. If you have any questions, ask your nurse or doctor.        STOP taking these medications   oxybutynin 5 MG tablet Commonly known as: DITROPAN Stopped by: [name], MD     TAKE these medications   atorvastatin 40 MG tablet Commonly known as: LIPITOR Take 40 mg by mouth daily.   cholecalciferol 25 MCG (1000 UT) tablet Commonly known as: VITAMIN D3 Take 1,000 Units by mouth daily.   hydrochlorothiazide 25 MG tablet Commonly known as: HYDRODIURIL Take 25 mg by mouth daily.   lisinopril 10 MG tablet Commonly known as: ZESTRIL Take 10 mg by mouth daily.   metFORMIN 500 MG tablet Commonly known as: GLUCOPHAGE Take 500 mg by mouth daily with breakfast.   Myrbetriq 50 MG  Tb24 tablet Generic drug: mirabegron ER Take 50 mg by mouth daily.   Potassium 99 MG Tabs Take 1 tablet by mouth daily.   Synthroid 125 MCG tablet Generic drug: levothyroxine TAKE 1 TABLET BY MOUTH EVERY DAY   Turmeric 500 MG Tabs Take 1 tablet by mouth daily.   vitamin B-12 1000 MCG tablet Commonly known as: CYANOCOBALAMIN Take 1,000 mcg by mouth daily.       Allergies: No Known Allergies  Past Medical History:  Diagnosis Date  . Diabetes mellitus without complication (HCC)   . Hypertension   . Obesity   . Thyroid disease    Hyperactive then was treated with iodine.     Past Surgical History:  Procedure Laterality Date  . BREAST REDUCTION SURGERY  [id number]    Family History  Problem Relation Age of Onset  .  Stroke Mother   . Diabetes Mother   . Diabetes Father   . Diabetes Sister   . Hypertension Sister   . Hypertension Maternal Aunt   . Cancer Maternal Uncle   . Diabetes Paternal Aunt   . Diabetes Paternal Uncle   . Cancer Maternal Grandmother     Social History:  reports that she has never smoked. She has never used smokeless tobacco. She reports current alcohol use. She reports that she does not use drugs.  REVIEW Of SYSTEMS:  Weight range:   Again is going to a weight loss clinic and they are giving her phentermine 37.5 mg on and off She is followed regularly apparently  Apparently her blood pressure is monitored regularly at the clinic   Wt Readings from Last 3 Encounters:  05/20/18 200 lb 12.8 oz (91.1 kg)  11/27/17 198 lb 9.6 oz (90.1 kg)  10/16/17 204 lb 6.4 oz (92.7 kg)   History of hypertension on a 2 drug regimen, followed by PCP  BP Readings from Last 3 Encounters:  06/25/18 132/75  05/20/18 104/60  11/27/17 124/82   DIABETES: She is taking 1 tablet of 500 mg metformin from her PCP and her last A1c was 6.8 in 04/2018 She says she is trying to continue exercise recently   Examination:   There were no vitals taken for this visit.      Assessment/Plan   Hypothyroidism, post ablative  and long-standing.  She has had variable requirements for her thyroid supplement even with taking brand name Synthroid  She now appears to be requiring progressively higher doses of levothyroxine again TSH is 7.8 although she is surprisingly asymptomatic Has had some weight gain and not clear of the reason why  Recommended that we go up to 137 mcg now and continue to take it before breakfast by itself as before  DIABETES on metformin alone: Her A1c is higher at 7.3 She thinks she is generally doing fairly well with her diet and is walking regularly up to 5 miles  She is due for a follow-up with her PCP but has not made an appointment  Patient has been taking metformin  500 mg twice daily and she agrees to take 1000 mg twice daily until seen by PCP She can be seen here for consultation if PCP desires  Follow-up in about 2 months again  Reather LittlerAjay Maridel Pixler 11/11/2018, 8:57 AM

## 2019-01-13 ENCOUNTER — Other Ambulatory Visit: Payer: Self-pay

## 2019-01-13 ENCOUNTER — Other Ambulatory Visit (INDEPENDENT_AMBULATORY_CARE_PROVIDER_SITE_OTHER): Payer: BC Managed Care – PPO

## 2019-01-13 DIAGNOSIS — E890 Postprocedural hypothyroidism: Secondary | ICD-10-CM | POA: Diagnosis not present

## 2019-01-13 LAB — TSH: TSH: 34 u[IU]/mL — ABNORMAL HIGH (ref 0.35–4.50)

## 2019-01-13 LAB — T4, FREE: Free T4: 0.71 ng/dL (ref 0.60–1.60)

## 2019-01-18 ENCOUNTER — Ambulatory Visit (INDEPENDENT_AMBULATORY_CARE_PROVIDER_SITE_OTHER): Payer: BC Managed Care – PPO | Admitting: Endocrinology

## 2019-01-18 ENCOUNTER — Other Ambulatory Visit: Payer: Self-pay

## 2019-01-18 ENCOUNTER — Encounter: Payer: Self-pay | Admitting: Endocrinology

## 2019-01-18 DIAGNOSIS — E1165 Type 2 diabetes mellitus with hyperglycemia: Secondary | ICD-10-CM | POA: Diagnosis not present

## 2019-01-18 DIAGNOSIS — E890 Postprocedural hypothyroidism: Secondary | ICD-10-CM

## 2019-01-18 DIAGNOSIS — R635 Abnormal weight gain: Secondary | ICD-10-CM

## 2019-01-18 NOTE — Progress Notes (Signed)
Andrea Delacruz 58 y.o.             Today's office visit was provided via telemedicine using video technique Explained to the patient and the the limitations of evaluation and management by telemedicine and the availability of in person appointments.  The patient understood the limitations and agreed to proceed. Patient also understood that the telehealth visit is billable. . Location of the patient: Home . Location of the provider: Office Only the patient and myself were participating in the encounter    Reason for Appointment:  Hypothyroidism, followup visit   History of Present Illness:   Her hypothyroidism was first diagnosed  around 1996 after her I-131 treatment for hyperthyroidism The supplement that the patient has taken include brand name Synthroid.  In 11/14 because of a TSH of 0.16 her dose was reduced to 100 mcg  In 7/15 her TSH was low normal at 0.43  but in 1/16 her TSH had increased further to 5.1 At that time she was starting to feel more fatigue for the previous 3 months and also some hair loss  She was switched to 112 g once a day in 1/16 and she has been taking this fairly regularly in the morning  Recent history: On her visit in 8/17 with TSH being about 10 her Synthroid dose was increased from 112 to 137 g. She felt much better with this, however subsequently has required variable doses  Since 08/2017 she has been taking 125 mcg  On her visit in 08/2018 she was complaining of fatigue but was also having insomnia She was told to increase her dosage by half tablet weekly since her TSH was 4.2 However subsequently her TSH was 7.8 without missing any doses  She was then started on 137 mcg brand-name Synthroid  Currently she is not complaining of any unusual fatigue although she said that she tends to get exhausted at the end of the day Previously had gained 8 pounds and now again has gained 10 pounds She is doing some exercises but recently only twice a  week  She has been compliant with her brand-name Synthroid at least about 30 minutes before breakfast daily with water  However now she reveals that she takes all her medications and supplements together in the morning and this includes prenatal vitamins with iron.  She states she started this in the beginning of the year for iron deficiency  Her TSH is much higher at 34 compared to 7.8 Free T4 is relatively lower  Wt Readings from Last 3 Encounters:  05/20/18 200 lb 12.8 oz (91.1 kg)  11/27/17 198 lb 9.6 oz (90.1 kg)  10/16/17 204 lb 6.4 oz (92.7 kg)    Labs:  Lab Results  Component Value Date   TSH 34.00 (H) 01/13/2019   TSH 7.78 (H) 11/08/2018   TSH 4.22 09/01/2018   FREET4 0.71 01/13/2019   FREET4 0.95 11/08/2018   FREET4 0.85 09/01/2018     Allergies as of 01/18/2019   No Known Allergies     Medication List       Accurate as of January 18, 2019  9:57 AM. If you have any questions, ask your nurse or doctor.        atorvastatin 40 MG tablet Commonly known as: LIPITOR Take 40 mg by mouth daily.   cholecalciferol 25 MCG (1000 UT) tablet Commonly known as: VITAMIN D3 Take 1,000 Units by mouth daily.   hydrochlorothiazide 25 MG tablet Commonly known as: HYDRODIURIL Take  25 mg by mouth daily.   lisinopril 10 MG tablet Commonly known as: ZESTRIL Take 10 mg by mouth daily.   metFORMIN 500 MG tablet Commonly known as: GLUCOPHAGE Take 500 mg by mouth 2 (two) times daily. Take 1 tablet (500mg  total) by mouth twice daily.   Myrbetriq 50 MG Tb24 tablet Generic drug: mirabegron ER Take 50 mg by mouth daily.   Potassium 99 MG Tabs Take 1 tablet by mouth daily.   Synthroid 137 MCG tablet Generic drug: levothyroxine Take 1 tablet (137 mcg total) by mouth daily before breakfast.   Turmeric 500 MG Tabs Take 1 tablet by mouth daily.   vitamin B-12 1000 MCG tablet Commonly known as: CYANOCOBALAMIN Take 1,000 mcg by mouth daily.       Allergies: No Known  Allergies  Past Medical History:  Diagnosis Date  . Diabetes mellitus without complication (HCC)   . Hypertension   . Obesity   . Thyroid disease    Hyperactive then was treated with iodine.     Past Surgical History:  Procedure Laterality Date  . BREAST REDUCTION SURGERY  [id number]    Family History  Problem Relation Age of Onset  . Stroke Mother   . Diabetes Mother   . Diabetes Father   . Diabetes Sister   . Hypertension Sister   . Hypertension Maternal Aunt   . Cancer Maternal Uncle   . Diabetes Paternal Aunt   . Diabetes Paternal Uncle   . Cancer Maternal Grandmother     Social History:  reports that she has never smoked. She has never used smokeless tobacco. She reports current alcohol use. She reports that she does not use drugs.  REVIEW Of SYSTEMS:  Weight range:     Wt Readings from Last 3 Encounters:  05/20/18 200 lb 12.8 oz (91.1 kg)  11/27/17 198 lb 9.6 oz (90.1 kg)  10/16/17 204 lb 6.4 oz (92.7 kg)   History of hypertension on a 2 drug regimen, followed by PCP  BP Readings from Last 3 Encounters:  06/25/18 132/75  05/20/18 104/60  11/27/17 124/82   DIABETES: She is taking 1 tablet of 500 mg metformin twice a day from her PCP Her last A1c was 7.3 in June he has not followed up However her blood sugars recently are as follows  FASTING 140-147 11 AM 118, 162 After lunch 84, 156  She says she is trying to do aerobic exercises but recently only twice a week She has gained 10 pounds recently and weight is about 215  Urine microalbumin testing is overdue   Examination:   There were no vitals taken for this visit.      Assessment/Plan   Hypothyroidism, post ablative  and long-standing.  She has had variable requirements for her thyroid supplement even with taking brand name Synthroid  She now appears to be requiring progressively higher doses of levothyroxine but her TSH is markedly increased at 34 now  She is at the maximum  levothyroxine dosage historically However most likely her high TSH is from taking prenatal vitamins with iron at the same time as her Synthroid She is compliant with her supplement  She does have some fatigue but this appears to have come on gradually Her main difficulty is progressive weight gain however  She will stop taking a prenatal vitamin in the morning and take her Synthroid by itself in the morning 30 minutes before eating She can take the vitamins at lunch or dinner  DIABETES on metformin  alone: Her A1c is last 7.3 and needs to be better  She is due for a follow-up with her PCP but has not made an appointment  Patient has been taking metformin 500 mg twice daily Discussed that her level of control is not adequate especially with fasting readings over 140 She will go up to 1000 mg in the evening She should try and exercise at least 5 days a week now She will follow-up with her PCP She is a good candidate for Invokana or Ozempic and she will discuss with PCP especially since she needs weight loss and cardiovascular risk reduction   Reather Littler 01/18/2019, 9:57 AM   Total visit time for evaluation and management of multiple problems, chart review, review of previous labs and counseling =25 minutes

## 2019-02-08 IMAGING — CR DG CHEST 2V
2 series · 2 of 2 positions shown · non-contrast
Comparison: Chest x-ray dated April 11, 2013.

CLINICAL DATA: Intermittent chest pain.

EXAM:
CHEST - 2 VIEW

[w chest pa]
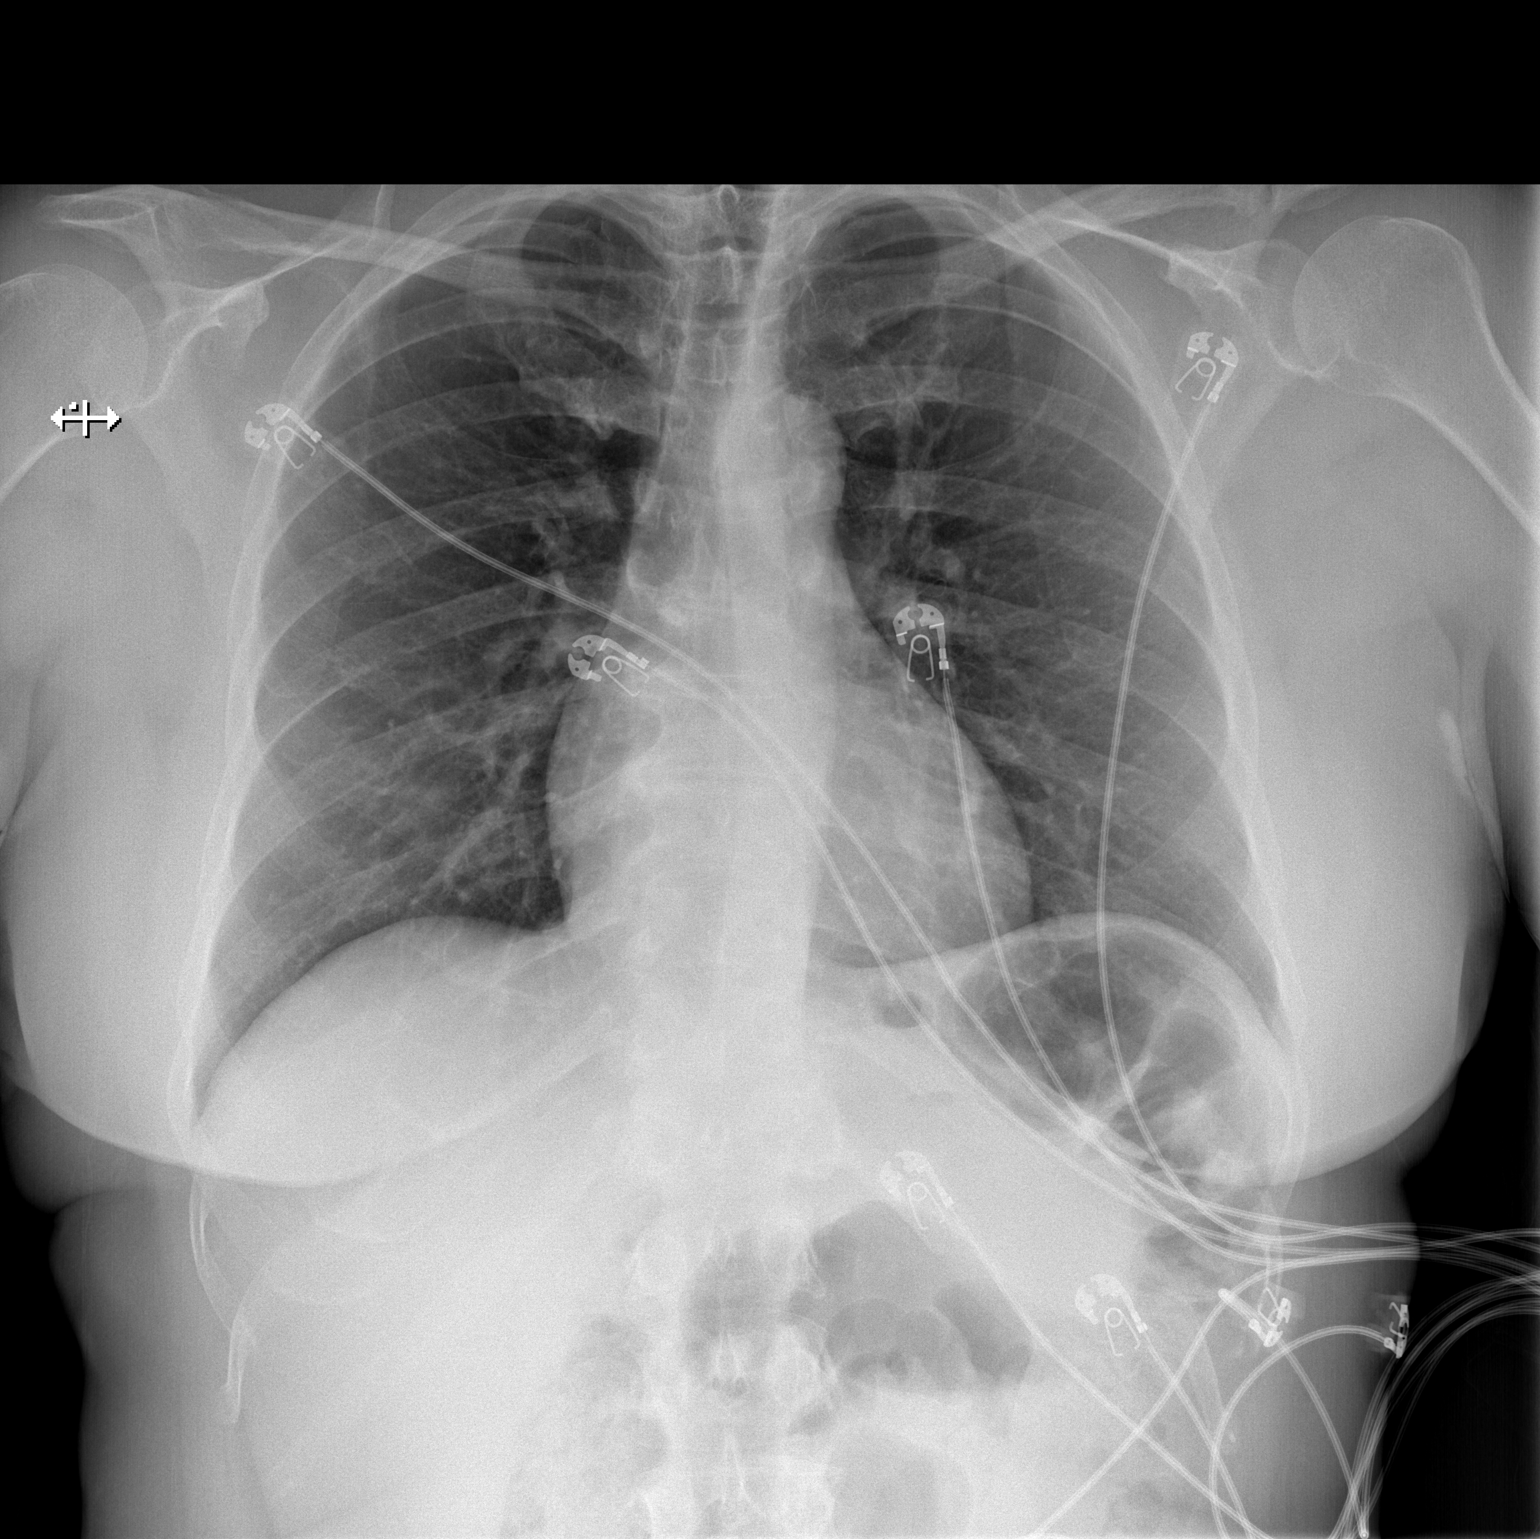

[w chest lat]
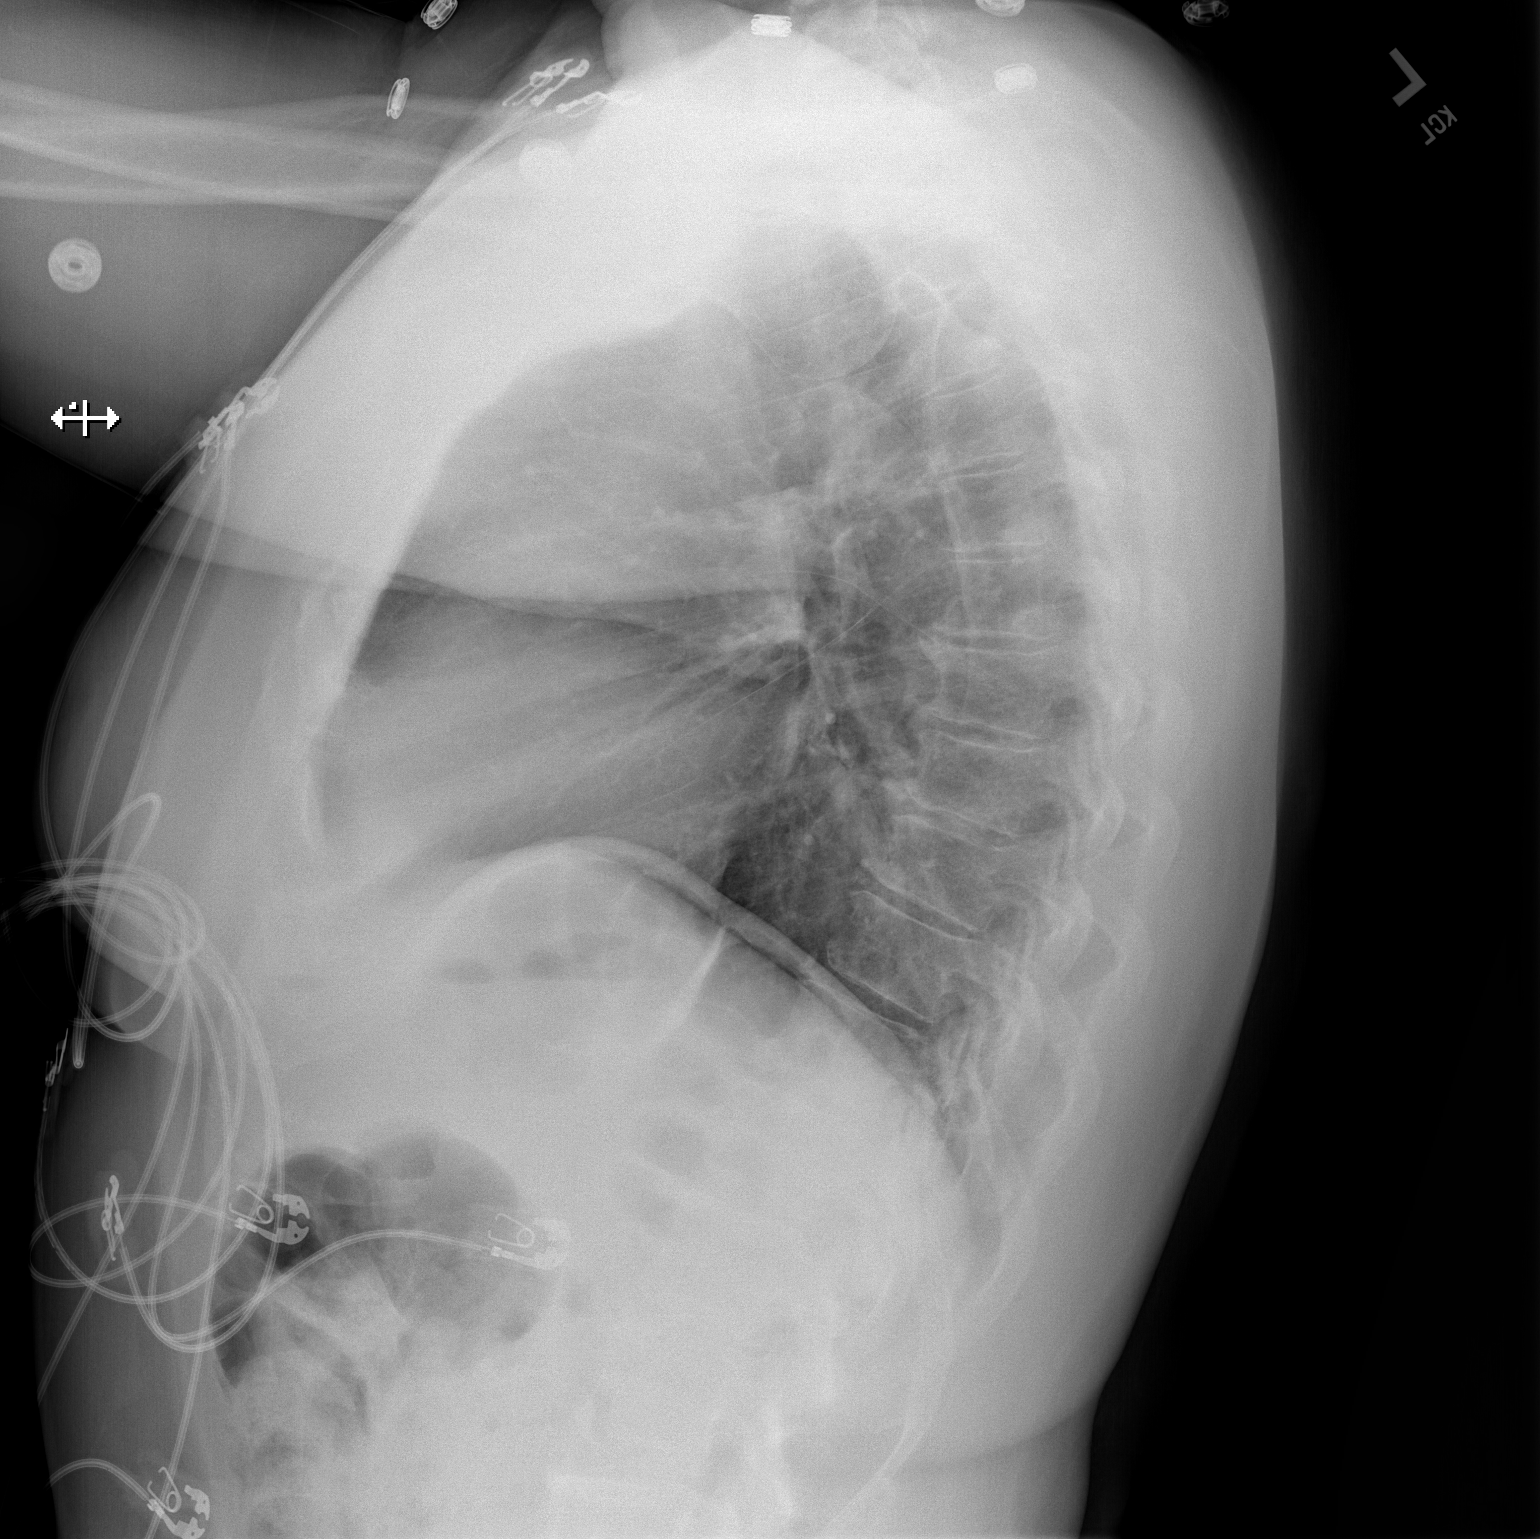

[2 of 2 positions shown; findings below may reference images not displayed]

FINDINGS: The heart size and mediastinal contours are within normal limits.
Both lungs are clear. The visualized skeletal structures are
unremarkable.
IMPRESSION: No active cardiopulmonary disease.

## 2019-02-10 ENCOUNTER — Other Ambulatory Visit: Payer: Self-pay | Admitting: Endocrinology

## 2019-02-25 ENCOUNTER — Other Ambulatory Visit (INDEPENDENT_AMBULATORY_CARE_PROVIDER_SITE_OTHER): Payer: BC Managed Care – PPO

## 2019-02-25 ENCOUNTER — Other Ambulatory Visit: Payer: Self-pay

## 2019-02-25 DIAGNOSIS — E890 Postprocedural hypothyroidism: Secondary | ICD-10-CM | POA: Diagnosis not present

## 2019-02-25 LAB — TSH: TSH: 4.7 u[IU]/mL — ABNORMAL HIGH (ref 0.35–4.50)

## 2019-02-25 LAB — T4, FREE: Free T4: 1.02 ng/dL (ref 0.60–1.60)

## 2019-02-27 NOTE — Progress Notes (Signed)
Andrea Delacruz 58 y.o.             Today's office visit was provided via telemedicine using video technique Explained to the patient and the the limitations of evaluation and management by telemedicine and the availability of in person appointments.  The patient understood the limitations and agreed to proceed. Patient also understood that the telehealth visit is billable. . Location of the patient: Home . Location of the provider: Office Only the patient and myself were participating in the encounter    Reason for Appointment:  Hypothyroidism, followup visit   History of Present Illness:   Her hypothyroidism was first diagnosed  around 1996 after her I-131 treatment for hyperthyroidism The supplement that the patient has taken include brand name Synthroid.  In 11/14 because of a TSH of 0.16 her dose was reduced to 100 mcg  In 7/15 her TSH was low normal at 0.43  but in 1/16 her TSH had increased further to 5.1 At that time she was starting to feel more fatigue for the previous 3 months and also some hair loss  She was switched to 112 g once a day in 1/16 and she has been taking this fairly regularly in the morning  Recent history: On her visit in 8/17 with TSH being about 10 her Synthroid dose was increased from 112 to 137 g. She felt much better with this, however subsequently has required variable doses  On her visit in 08/2018 she was complaining of fatigue but was also having insomnia She was told to increase her dosage by half tablet weekly since her TSH was 4.2 However subsequently her TSH was 7.8 and dose was increased to 137  In follow-up on 01/29/2019 her TSH was unexpectedly high at 34  She was complaining that she tends to get exhausted at the end of the day Also had continued to gain weight  She had apparently started taking prenatal vitamins with iron with her Synthroid prior to her last visit She is not taking this at night  She has been consistent with  her brand-name Synthroid at least about 30 minutes before breakfast daily with water  However despite her TSH improving from the high level of 34 she still feels easily fatigued Has probably gained 2 to 4 pounds again  Her TSH is slightly high at 4.7, free T4 improved  Wt Readings from Last 3 Encounters:  05/20/18 200 lb 12.8 oz (91.1 kg)  11/27/17 198 lb 9.6 oz (90.1 kg)  10/16/17 204 lb 6.4 oz (92.7 kg)    Labs:  Lab Results  Component Value Date   TSH 4.70 (H) 02/25/2019   TSH 34.00 (H) 01/13/2019   TSH 7.78 (H) 11/08/2018   FREET4 1.02 02/25/2019   FREET4 0.71 01/13/2019   FREET4 0.95 11/08/2018     Allergies as of 02/28/2019   No Known Allergies     Medication List       Accurate as of February 27, 2019  9:06 PM. If you have any questions, ask your nurse or doctor.        atorvastatin 40 MG tablet Commonly known as: LIPITOR Take 40 mg by mouth daily.   cholecalciferol 25 MCG (1000 UT) tablet Commonly known as: VITAMIN D3 Take 1,000 Units by mouth daily.   hydrochlorothiazide 25 MG tablet Commonly known as: HYDRODIURIL Take 25 mg by mouth daily.   lisinopril 10 MG tablet Commonly known as: ZESTRIL Take 10 mg by mouth daily.   metFORMIN  500 MG tablet Commonly known as: GLUCOPHAGE Take 500 mg by mouth 2 (two) times daily. Take 1 tablet (500mg  total) by mouth twice daily.   Myrbetriq 50 MG Tb24 tablet Generic drug: mirabegron ER Take 50 mg by mouth daily.   Potassium 99 MG Tabs Take 1 tablet by mouth daily.   Synthroid 137 MCG tablet Generic drug: levothyroxine TAKE 1 TABLET (137 MCG TOTAL) BY MOUTH DAILY BEFORE BREAKFAST.   Turmeric 500 MG Tabs Take 1 tablet by mouth daily.   vitamin B-12 1000 MCG tablet Commonly known as: CYANOCOBALAMIN Take 1,000 mcg by mouth daily.       Allergies: No Known Allergies  Past Medical History:  Diagnosis Date  . Diabetes mellitus without complication (HCC)   . Hypertension   . Obesity   . Thyroid  disease    Hyperactive then was treated with iodine.     Past Surgical History:  Procedure Laterality Date  . BREAST REDUCTION SURGERY     Family History  Problem Relation Age of Onset  . Stroke Mother   . Diabetes Mother   . Diabetes Father   . Diabetes Sister   . Hypertension Sister   . Hypertension Maternal Aunt   . Cancer Maternal Uncle   . Diabetes Paternal Aunt   . Diabetes Paternal Uncle   . Cancer Maternal Grandmother     Social History:  reports that she has never smoked. She has never used smokeless tobacco. She reports current alcohol use. She reports that she does not use drugs.  REVIEW Of SYSTEMS:   History of hypertension on a 2 drug regimen, followed by PCP  BP Readings from Last 3 Encounters:  06/25/18 132/75  05/20/18 104/60  11/27/17 124/82   DIABETES: She is taking 1 tablet of 500 mg metformin twice a day from her PCP Her last A1c was 7.3 in June he has not followed up with her  Not able to exercise much and has gained weight  Urine microalbumin testing is overdue   Examination:   There were no vitals taken for this visit.      Assessment/Plan   Hypothyroidism, post ablative  and long-standing.  She has had variable requirements for her thyroid supplement even with taking brand name Synthroid  She now has a TSH of 4.7 with taking Synthroid 137 mcg However despite improvement in her TSH compared to her last visit she still complains of fatigability She is taking her Synthroid as directed Has stopped taking her prenatal vitamin in the morning at the same time which has improved her TSH without any change in the doses She does not miss any doses  Since her TSH is almost normal about 5 weeks since her last visit will not increase her dosage but add another half tablet only once a week on Sundays She will come back in 4 months for follow-up Reminded her to not take any calcium or vitamins with iron in the morning  FATIGUE: This is  unexplained and she needs to follow-up with her PCP, apparently does not have any unusually high blood sugars also recently  [id number] 02/27/2019, 9:06 PM

## 2019-02-28 ENCOUNTER — Ambulatory Visit (INDEPENDENT_AMBULATORY_CARE_PROVIDER_SITE_OTHER): Payer: BC Managed Care – PPO | Admitting: Endocrinology

## 2019-02-28 ENCOUNTER — Other Ambulatory Visit: Payer: Self-pay

## 2019-02-28 ENCOUNTER — Encounter: Payer: Self-pay | Admitting: Endocrinology

## 2019-02-28 VITALS — Ht 67.0 in

## 2019-02-28 DIAGNOSIS — E890 Postprocedural hypothyroidism: Secondary | ICD-10-CM | POA: Diagnosis not present

## 2019-03-01 LAB — BASIC METABOLIC PANEL
BUN: 12 (ref 4–21)
Creatinine: 0.7 (ref <=1.1)
Glucose: 96
Potassium: 4.5 (ref 3.4–5.3)
Sodium: 139 (ref 137–147)

## 2019-03-01 LAB — VITAMIN D 25 HYDROXY (VIT D DEFICIENCY, FRACTURES): Vit D, 25-Hydroxy: 33.2

## 2019-03-01 LAB — CBC AND DIFFERENTIAL
HCT: 38 (ref 36–46)
Hemoglobin: 12.6 (ref 12.0–16.0)
WBC: 5.5

## 2019-03-01 LAB — HEPATIC FUNCTION PANEL
ALT: 273 — AB (ref 7–35)
AST: 226 — AB (ref 13–35)

## 2019-03-01 LAB — VITAMIN B12: Vitamin B-12: 1118

## 2019-05-10 ENCOUNTER — Other Ambulatory Visit: Payer: Self-pay | Admitting: Endocrinology

## 2019-06-21 ENCOUNTER — Other Ambulatory Visit (INDEPENDENT_AMBULATORY_CARE_PROVIDER_SITE_OTHER): Payer: BC Managed Care – PPO

## 2019-06-21 ENCOUNTER — Other Ambulatory Visit: Payer: Self-pay

## 2019-06-21 DIAGNOSIS — E890 Postprocedural hypothyroidism: Secondary | ICD-10-CM | POA: Diagnosis not present

## 2019-06-21 LAB — TSH: TSH: 23.25 u[IU]/mL — ABNORMAL HIGH (ref 0.35–4.50)

## 2019-06-21 LAB — T4, FREE: Free T4: 0.63 ng/dL (ref 0.60–1.60)

## 2019-06-23 ENCOUNTER — Encounter: Payer: Self-pay | Admitting: Endocrinology

## 2019-06-23 ENCOUNTER — Other Ambulatory Visit: Payer: Self-pay

## 2019-06-23 ENCOUNTER — Ambulatory Visit (INDEPENDENT_AMBULATORY_CARE_PROVIDER_SITE_OTHER): Payer: BC Managed Care – PPO | Admitting: Endocrinology

## 2019-06-23 DIAGNOSIS — E890 Postprocedural hypothyroidism: Secondary | ICD-10-CM | POA: Diagnosis not present

## 2019-06-23 MED ORDER — TIROSINT 150 MCG PO CAPS: 1.0000 | ORAL_CAPSULE | Freq: Every day | ORAL | 2 refills | Status: DC

## 2019-06-23 NOTE — Progress Notes (Signed)
Andrea Delacruz 59 y.o.             Today's office visit was provided via telemedicine using video technique Explained to the patient and the the limitations of evaluation and management by telemedicine and the availability of in person appointments.  The patient understood the limitations and agreed to proceed. Patient also understood that the telehealth visit is billable. . Location of the patient: Home . Location of the provider: Office Only the patient and myself were participating in the encounter    Reason for Appointment:  Hypothyroidism, followup visit   History of Present Illness:   Her hypothyroidism was first diagnosed  around 1996 after her I-131 treatment for hyperthyroidism The supplement that the patient has taken include brand name Synthroid.  In 11/14 because of a TSH of 0.16 her dose was reduced to 100 mcg  In 7/15 her TSH was low normal at 0.43  but in 1/16 her TSH had increased further to 5.1 At that time she was starting to feel more fatigue for the previous 3 months and also some hair loss  She was switched to 112 g once a day in 1/16 and she has been taking this fairly regularly in the morning  Recent history: On her visit in 8/17 with TSH being about 10 her Synthroid dose was increased from 112 to 137 g. She felt much better with this, however subsequently has required variable doses  On her visit in 08/2018 she was complaining of fatigue but was also having insomnia She was told to increase her dosage by half tablet weekly since her TSH was 4.2 However subsequently her TSH was 7.8 and dose was increased to 137  In follow-up on 01/29/2019 her TSH was unexpectedly high at 34 She was complaining that she tends to get exhausted at the end of the day Also had continued to gain weight This is likely to be from taking prenatal vitamins with Synthroid  On follow-up in 10/20 her TSH is almost normal with you being her prenatal vitamins tonight  She has  been consistent with Synthroid at least about 30 minutes before breakfast daily with water Also taking an extra half a pill since 02/2019 As before has taken her prenatal vitamins at night  In the last couple of months she has had other problems with weakness in her legs but also recently starting to have more fatigue Has had mild chronic hair loss which is not any worse Does not complain of any excessive cold intolerance  Her TSH is much higher at 23 compared to 4.7  Wt Readings from Last 3 Encounters:  05/20/18 200 lb 12.8 oz (91.1 kg)  11/27/17 198 lb 9.6 oz (90.1 kg)  10/16/17 204 lb 6.4 oz (92.7 kg)    Labs:  Lab Results  Component Value Date   TSH 23.25 (H) 06/21/2019   TSH 4.70 (H) 02/25/2019   TSH 34.00 (H) 01/13/2019   FREET4 0.63 06/21/2019   FREET4 1.02 02/25/2019   FREET4 0.71 01/13/2019     Allergies as of 06/23/2019   No Known Allergies     Medication List       Accurate as of June 23, 2019  8:42 AM. If you have any questions, ask your nurse or doctor.        atorvastatin 40 MG tablet Commonly known as: LIPITOR Take 40 mg by mouth daily.   cholecalciferol 25 MCG (1000 UNIT) tablet Commonly known as: VITAMIN D3 Take 1,000 Units by  mouth daily.   hydrochlorothiazide 25 MG tablet Commonly known as: HYDRODIURIL Take 25 mg by mouth daily.   lisinopril 10 MG tablet Commonly known as: ZESTRIL Take 10 mg by mouth daily.   metFORMIN 500 MG tablet Commonly known as: GLUCOPHAGE Take 500 mg by mouth 2 (two) times daily. Take 1 tablet (500mg  total) by mouth twice daily.   Myrbetriq 50 MG Tb24 tablet Generic drug: mirabegron ER Take 50 mg by mouth daily.   Potassium 99 MG Tabs Take 1 tablet by mouth daily.   Synthroid 137 MCG tablet Generic drug: levothyroxine TAKE 1 TABLET (137 MCG TOTAL) BY MOUTH DAILY BEFORE BREAKFAST.   Turmeric 500 MG Tabs Take 1 tablet by mouth daily.   vitamin B-12 1000 MCG tablet Commonly known as:  CYANOCOBALAMIN Take 1,000 mcg by mouth daily.       Allergies: No Known Allergies  Past Medical History:  Diagnosis Date  . Diabetes mellitus without complication (HCC)   . Hypertension   . Obesity   . Thyroid disease    Hyperactive then was treated with iodine.     Past Surgical History:  Procedure Laterality Date  . BREAST REDUCTION SURGERY     Family History  Problem Relation Age of Onset  . Stroke Mother   . Diabetes Mother   . Diabetes Father   . Diabetes Sister   . Hypertension Sister   . Hypertension Maternal Aunt   . Cancer Maternal Uncle   . Diabetes Paternal Aunt   . Diabetes Paternal Uncle   . Cancer Maternal Grandmother     Social History:  reports that she has never smoked. She has never used smokeless tobacco. She reports current alcohol use. She reports that she does not use drugs.  REVIEW Of SYSTEMS:   History of hypertension controlled on a 2 drug regimen, followed by PCP  BP Readings from Last 3 Encounters:  06/25/18 132/75  05/20/18 104/60  11/27/17 124/82   DIABETES: She is taking 1 tablet of 500 mg metformin twice a day from her PCP Her last A1c was 7.3 in June 2020 and has not had a follow-up lab  Not able to exercise because of leg weakness, weight is about the same recently although higher last year    Examination:   There were no vitals taken for this visit.      Assessment/Plan   Hypothyroidism, post ablative  and long-standing.  She has had variable requirements for her thyroid supplement even with taking brand name Synthroid  She now has a TSH of 23 compared to 4.7 with taking Synthroid 137 mcg, 7-1/2 tablets a week  Her TSH getting higher is unable to be explained She has had no difficulty with compliance and is not taking any iron or calcium-containing vitamins or other medications including PPI drugs in the mornings She has not missed any doses Continues to take brand-name Synthroid  However despite  improvement in her TSH compared to her last visit she still complains of fatigability She is taking her Synthroid as directed Has stopped taking her prenatal vitamin in the morning at the same time which has improved her TSH without any change in the doses She does not miss any doses  Since she may have difficulties with absorption and is needing unusually higher dose of Synthroid will try her on Tirosint 150 mcg before breakfast and discussed the differences She will use a co-pay card online and if needed will need to do a prior authorization  Also needs to follow-up with her PCP regarding her A1c that is needed, currently still under the care of the PCP for diabetes management  FATIGUE and weakness: This is partly related to hypothyroidism but needs to follow-up with PCP  Follow-up in 6 weeks  Reather Littler 06/23/2019, 8:42 AM

## 2019-06-27 ENCOUNTER — Ambulatory Visit: Payer: BC Managed Care – PPO | Admitting: Neurology

## 2019-06-27 ENCOUNTER — Other Ambulatory Visit: Payer: Self-pay

## 2019-06-27 ENCOUNTER — Encounter: Payer: Self-pay | Admitting: Neurology

## 2019-06-27 VITALS — BP 135/84 | HR 108 | Ht 67.0 in | Wt 217.8 lb

## 2019-06-27 DIAGNOSIS — M6281 Muscle weakness (generalized): Secondary | ICD-10-CM

## 2019-06-27 NOTE — Progress Notes (Signed)
[facility] Clinic Note - Initial Visit   Date: 06/27/19  Andrea Delacruz MRN: [id number] DOB: 07-17-1960   Dear Dr. Lynann Bologna:   Thank you for your kind referral of Andrea Delacruz for consultation of generalized weakness. Although her history is well known to you, please allow [place] to reiterate it for the purpose of our medical record. The patient was accompanied to the clinic by self.    History of Present Illness: Andrea Delacruz is a 59 y.o. right-handed female with hypothyroidism, diabetes mellitus, hypertension, and overactive bladder presenting for urgent evaluation of generalized weakness of the arms and legs.   Starting around November 2020, she began noticing difficulty climbing stairs, which gradually progressed to where she now has to manually lift her legs to place them in the car.  She also has weakness in the arm, especially raising them.  She denies any pain, cramps, dark-colored urine.  She denies problems with double vision, swallowing, or slurred speech.  She takes lipitor '40mg'$  and has been on this for the past 3 years.   She tried physical therapy which did not help.  She had extensive evaluation which inlcuded neuroimaging of the neuroaxis which shows cervical spondylosis with spinal stenosis at C3-4 through C6-7, no cord compression. She has degenerative changes in the lumbar spine at L5 nerve root, but this would not explain her hip flexor weakness.  Given the inconsistency with her exam/history and imaging findings, Dr. Lynann Bologna referred her to see me for neurological evaluation.   Of note, her LFTs have been elevated in the 200s and she was told she has mild fatty liver. CK level has not been checked.   Out-side paper records, electronic medical record, and images have been reviewed where available and summarized as:  MRI cervical spine without S1/20 01/2020: 1.  Multilevel cervical spondylosis with resultant diffuse spinal stenosis and  C3-4 through C6-7, severe at C5-6 and C6-7 levels. 2.  Multifactorial degenerative changes with resultant multilevel foraminal narrowing as above.  Notable findings include moderate left greater than right C5 foraminal stenosis with severe bilateral C6 and C7 foraminal stenosis  MRI thoracic spine without contrast 05/28/2019: 1.  Minimal degenerative disc bulging at T3-4 through T8 10-11 without significant stenosis or neural impingement. 2.  Mild facet hypertrophy at T7/8.  T12/L1, which could contribute to underlying back pain. 3.  Otherwise unremarkable MRI of thoracic spine for patient's age.  MRI lumbar spine without contrast 06/16/2019: Conus medullaris no with fatty infiltration of the lipoma.  No definite signs of tethered cord. Mild to moderate spinal stenosis at L4-5 with moderate subarticular stenosis bilaterally. Moderate to severe subarticular stenosis at L5-S1 with bilateral L5 nerve root impingement left greater than right.  Lab Results  Component Value Date   HGBA1C 7.3 (H) 11/08/2018   Lab Results  Component Value Date   VITAMINB12 1,118 03/01/2019   Lab Results  Component Value Date   TSH 23.25 (H) 06/21/2019   No results found for: CKTOTAL, CKMB, CKMBINDEX, TROPONINI  Lab Results  Component Value Date   ALT 273 (A) 03/01/2019   AST 226 (A) 03/01/2019     Past Medical History:  Diagnosis Date  . Diabetes mellitus without complication ([place])   . Hypertension   . Obesity   . Thyroid disease    Hyperactive then was treated with iodine.     Past Surgical History:  Procedure Laterality Date  . BREAST REDUCTION SURGERY  [id number]     Medications:  Outpatient Encounter Medications as of 06/27/2019  Medication Sig  . cholecalciferol (VITAMIN D3) 25 MCG (1000 UT) tablet Take 1,000 Units by mouth daily.  . hydrochlorothiazide (HYDRODIURIL) 25 MG tablet Take 25 mg by mouth daily.   Marland Kitchen lisinopril (PRINIVIL,ZESTRIL) 10 MG tablet Take 10 mg by mouth daily.  .  metFORMIN (GLUCOPHAGE) 500 MG tablet Take 500 mg by mouth 2 (two) times daily. Take 1 tablet ('500mg'$  total) by mouth twice daily.  . mirabegron ER (MYRBETRIQ) 50 MG TB24 tablet Take 50 mg by mouth daily.  Marland Kitchen TIROSINT 150 MCG CAPS Take 1 capsule (150 mcg total) by mouth daily before breakfast.  . [DISCONTINUED] atorvastatin (LIPITOR) 40 MG tablet Take 40 mg by mouth daily.  . Potassium 99 MG TABS Take 1 tablet by mouth daily.  . Turmeric 500 MG TABS Take 1 tablet by mouth daily.  . vitamin B-12 (CYANOCOBALAMIN) 1000 MCG tablet Take 1,000 mcg by mouth daily.   No facility-administered encounter medications on file as of 06/27/2019.    Allergies: No Known Allergies  Family History: Family History  Problem Relation Age of Onset  . Stroke Mother   . Diabetes Mother   . Diabetes Father   . Diabetes Sister   . Hypertension Sister   . Hypertension Maternal Aunt   . Cancer Maternal Uncle   . Diabetes Paternal Aunt   . Diabetes Paternal Uncle   . Cancer Maternal Grandmother     Social History: Social History   Tobacco Use  . Smoking status: Never Smoker  . Smokeless tobacco: Never Used  Substance Use Topics  . Alcohol use: Yes    Comment: Once oer year around holidays  . Drug use: No   Social History   Social History Narrative   Marital Status:  Married Merchant navy officer)    Children:  G2 Daughter (01) Mining engineer) Twins (Adam and Antiyonne)   Pets: Cat (01)    Living Situation: Lives with husband    Occupation:  School Principal -  Musician    Education:  Designer, jewellery in Education    Tobacco Use/Exposure:  None    Alcohol Use:  Occasional   Drug Use:  None   Diet:  Regular   Exercise:  None   Hobbies: Reading           Vital Signs:  BP 135/84   Pulse (!) 108   Ht '5\' 7"'$  (1.702 m)   Wt 217 lb 12.8 oz (98.8 kg)   SpO2 99%   BMI 34.11 kg/m    General Medical Exam:   General:  Well appearing, comfortable.   Eyes/ENT: see cranial nerve examination.   Neck:   No carotid  bruits. Respiratory:  Clear to auscultation, good air entry bilaterally.   Cardiac:  Regular rate and rhythm, no murmur.   Extremities:  No deformities, edema, or skin discoloration.  Skin:  No rashes or lesions.  Neurological Exam: MENTAL STATUS including orientation to time, place, person, recent and remote memory, attention span and concentration, language, and fund of knowledge is normal.  Speech is not dysarthric.  CRANIAL NERVES: II:  No visual field defects.   III-IV-VI: Pupils equal round and reactive to light.  Normal conjugate, extra-ocular eye movements in all directions of gaze.  No nystagmus.  No ptosis.   V:  Normal facial sensation.    VII:  Normal facial symmetry and movements.   VIII:  Normal hearing and vestibular function.   IX-X:  Normal palatal movement.   XI:  Normal shoulder shrug and head rotation.   XII:  Normal tongue strength and range of motion, no deviation or fasciculation.  MOTOR:  No atrophy, fasciculations or abnormal movements.  No pronator drift.  Neck flexion is 5-/5, neck extension is 5/5  Upper Extremity:  Right  Left  Deltoid  4/5   4/5   Biceps  5/5   5/5   Triceps  5/5   5/5   Infraspinatus 4/5  4/5  Medial pectoralis 4+/5  4+/5  Wrist extensors  5/5   5/5   Wrist flexors  5/5   5/5   Finger extensors  5/5   5/5   Finger flexors  5/5   5/5   Dorsal interossei  5/5   5/5   Abductor pollicis  5/5   5/5   Tone (Ashworth scale)  0  0   Lower Extremity:  Right  Left  Hip flexors  4/5   4/5   Hip extensors  4/5   4/5   Adductor 4/5  4/5  Abductor 5-/5  5-/5  Knee flexors  5/5   5/5   Knee extensors  5/5   5/5   Dorsiflexors  5/5   5/5   Plantarflexors  5/5   5/5   Toe extensors  5/5   5/5   Toe flexors  5/5   5/5   Tone (Ashworth scale)  0  0   MSRs:  Right        Left                  brachioradialis 1+  1+  biceps 1+  1+  triceps 1+  1+  patellar 1+  1+  ankle jerk 1+  1+  Hoffman no  no  plantar response down  down    SENSORY:  Normal and symmetric perception of light touch, pinprick, vibration, and proprioception.    COORDINATION/GAIT: Normal finger-to- nose-finger.  Intact rapid alternating movements bilaterally.  Able to rise from a chair without using arms, but with marked effort.  Gait mildlywide-based, unassisted and stable.   IMPRESSION: Painless proximal arm and leg weakness progressive since fall 2020.  Her exam shows weakness following a limb-girdle pattern.  Distal strength is full and intact.  She does not have signs of myelopathy on her exam, so I agree with Dr. Lynann Bologna, that her cervical spondylosis and canal stenosis is unlikely playing a role in her current presentation.  Instead, I am most concerned about myopathy, autoimmune vs statin-induced, especially given that she has transaminitis which may be spilling from muscle breakdown and not all caused by liver disorder. I will screen her for myasthenia gravis,however, this is unlikely given lack of fluctuating course.  PLAN/RECOMMENDATIONS:  Check ESR, CRP, CK, aldolase, myositis panel, myasthenia gravis panel  NCS/EMG of the left arm and leg - myopathy panel Stop atorvastatin and all statin therapy for now  Further recommendations pending results.   Thank you for allowing me to participate in patient's care.  If I can answer any additional questions, I would be pleased to do so.    Sincerely,    Etoy Mcdonnell K. Posey Pronto, DO

## 2019-06-27 NOTE — Patient Instructions (Addendum)
Check labs  NCS/EMG of the left arm and leg  Stop atorvastatin (Lipitor)

## 2019-07-04 ENCOUNTER — Other Ambulatory Visit: Payer: BC Managed Care – PPO

## 2019-07-04 ENCOUNTER — Other Ambulatory Visit: Payer: Self-pay

## 2019-07-04 DIAGNOSIS — M6281 Muscle weakness (generalized): Secondary | ICD-10-CM

## 2019-07-04 LAB — SEDIMENTATION RATE: Sed Rate: 11 mm/h (ref 0–30)

## 2019-07-08 ENCOUNTER — Other Ambulatory Visit: Payer: Self-pay

## 2019-07-08 ENCOUNTER — Telehealth: Payer: Self-pay

## 2019-07-08 DIAGNOSIS — M6281 Muscle weakness (generalized): Secondary | ICD-10-CM

## 2019-07-08 NOTE — Telephone Encounter (Signed)
PCP does not draw for outside providers. Patient is aware and orders are to be sent to [facility] on [facility]

## 2019-07-08 NOTE — Telephone Encounter (Signed)
-----   Message from Glendale Chard, DO sent at 07/07/2019  5:53 PM EST ----- Regarding: Labs Hi team,  This patient needs to get CMP drawn asap and since we do not have lab, can you contact her PCP's office and see if patient can have it drawn there?  Otherwise, will need to send her to [facility].  Thanks,  DP

## 2019-07-09 LAB — COMPREHENSIVE METABOLIC PANEL
ALT: 434 IU/L — ABNORMAL HIGH (ref 0–32)
AST: 411 IU/L — ABNORMAL HIGH (ref 0–40)
Albumin/Globulin Ratio: 1.7 (ref 1.2–2.2)
Albumin: 4.5 g/dL (ref 3.8–4.9)
Alkaline Phosphatase: 101 IU/L (ref 39–117)
BUN/Creatinine Ratio: 10 (ref 9–23)
BUN: 6 mg/dL (ref 6–24)
Bilirubin Total: 0.5 mg/dL (ref 0.0–1.2)
CO2: 26 mmol/L (ref 20–29)
Calcium: 10.4 mg/dL — ABNORMAL HIGH (ref 8.7–10.2)
Chloride: 98 mmol/L (ref 96–106)
Creatinine, Ser: 0.58 mg/dL (ref 0.57–1.00)
GFR calc Af Amer: 117 mL/min/{1.73_m2} (ref >59)
GFR calc non Af Amer: 101 mL/min/{1.73_m2} (ref >59)
Globulin, Total: 2.7 g/dL (ref 1.5–4.5)
Glucose: 71 mg/dL (ref 65–99)
Potassium: 4.6 mmol/L (ref 3.5–5.2)
Sodium: 138 mmol/L (ref 134–144)
Total Protein: 7.2 g/dL (ref 6.0–8.5)

## 2019-07-12 ENCOUNTER — Encounter: Payer: Self-pay | Admitting: Neurology

## 2019-07-12 ENCOUNTER — Other Ambulatory Visit: Payer: BC Managed Care – PPO

## 2019-07-12 ENCOUNTER — Other Ambulatory Visit: Payer: Self-pay

## 2019-07-12 ENCOUNTER — Ambulatory Visit (INDEPENDENT_AMBULATORY_CARE_PROVIDER_SITE_OTHER): Payer: BC Managed Care – PPO | Admitting: Neurology

## 2019-07-12 VITALS — BP 119/77 | HR 98 | Resp 14 | Ht 69.0 in | Wt 218.6 lb

## 2019-07-12 DIAGNOSIS — M6281 Muscle weakness (generalized): Secondary | ICD-10-CM

## 2019-07-12 DIAGNOSIS — M6282 Rhabdomyolysis: Secondary | ICD-10-CM | POA: Diagnosis not present

## 2019-07-12 DIAGNOSIS — G729 Myopathy, unspecified: Secondary | ICD-10-CM

## 2019-07-12 NOTE — Procedures (Signed)
[facility]  [address] [place], [address]  [place], [place] [address] Tel: [phone] Fax:  [phone] Test Date:  07/12/2019  Patient: Andrea Delacruz DOB: [birth year] Physician: Nita Sickle, DO  Sex: Female Height: ' 0" Ref Phys: Nita Sickle, DO  ID#: [id number] Temp: 32.0C Technician:    Patient Complaints: This is a 59 year old female with hyperCKemia and generalized weakness referred for evaluation of myopathy.  NCV & EMG Findings: Extensive electrodiagnostic testing of the left upper and lower extremity shows:  1. Left median and sural sensory responses are within normal limits. 2. The left median, ulnar, and peroneal motor responses are within normal limits. 3. Diffuse small, complex motor units are seen affecting the pronator teres, brachioradialis, biceps, triceps, deltoid, infraspinatus, gluteus medius, rectus femoris, iliacus, and medial gastrocnemius muscles with evidence of muscle membrane irritability.  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    ___________________________ Nita Sickle, DO    Nerve Conduction Studies Anti Sensory Summary Table   Site NR Peak (ms) Norm Peak (ms) P-T Amp (V) Norm P-T Amp  Left Median Anti Sensory (2nd Digit)  32C  Wrist    3.3 <3.6 41.3 >15  Left Sural Anti Sensory (Lat Mall)  32C  Calf    2.7 <4.6 17.4 >4   Motor Summary Table   Site NR Onset (ms) Norm Onset (ms) O-P Amp (mV) Norm O-P Amp Site1 Site2 Delta-0 (ms) Dist (cm) Vel (m/s) Norm Vel (m/s)  Left Median Motor (Abd Poll Brev)  32C  Wrist    3.1 <4.0 8.8 >6 Elbow Wrist 4.7 28.0 60 >50  Elbow    7.8  8.8         Left Peroneal Motor (Ext Dig Brev)  32C  Ankle    3.8 <6.0 5.2 >2.5 B Fib Ankle 6.8 35.0 51 >40  B Fib    10.6  4.6  Poplt B Fib 1.4 9.0 64 >40  Poplt    12.0  4.2         Left Peroneal TA Motor (Tib Ant)  32C  Fib Head    2.5 <4.5 4.0 >3 Poplit Fib Head  1.4 9.0 64 >40  Poplit    3.9  4.0         Left Ulnar Motor (Abd Dig Minimi)  32C  Wrist    2.7 <3.1 11.1 >7 B Elbow Wrist 3.9 23.0 59 >50  B Elbow    6.6  10.7  A Elbow B Elbow 2.0 10.0 50 >50  A Elbow    8.6  10.2          EMG   Side Muscle Ins Act Fibs Psw Fasc Number Recrt Dur Dur. Amp Amp. Poly Poly. Comment  Right BicepsFemS Nml Nml Nml Nml Nml Nml Nml Nml Nml Nml Nml Nml N/A  Left 1stDorInt Nml Nml Nml Nml Nml Nml Nml Nml Nml Nml Nml Nml N/A  Left BrachioRad 1+ 1+ Nml Nml Nml Nml Some 1+ Some 1- Some 1+ E.R.  Left FlexPolLong Nml Nml Nml Nml Nml Nml Nml Nml Nml Nml Nml Nml N/A  Left PronatorTeres Nml Nml Nml Nml Nml Nml Few 1+ Few 1- Few 1+ N/A  Left Biceps Nml 2+ Nml Nml Nml Nml Many 1+ Many 1- Many 1+ E.R.  Left Triceps Nml 1+ Nml Nml Nml Nml Some 1+ Some 1- Some 1+ E.R.  Left Deltoid Nml 1+ Nml Nml Nml Nml  Many 1+ Many 1- Many 1+ E.R.  Left Cervical Parasp Low Nml 1+ Nml Nml NE Nml - - - - - - N/A  Left Infraspinatus 2+ 1+ Nml Nml Nml Nml Most 1+ Most 1- Most 1+ E.R.  Left GluteusMed 2+ 1+ Nml Nml Nml Nml All 1+ All 1+ All 1+ E.R.  Left AntTibialis Nml Nml Nml Nml Nml Nml Nml Nml Nml Nml Nml Nml N/A  Left Gastroc Nml 1+ Nml Nml Nml Nml Few 1+ Few 1- Few 1+ N/A  Left RectFemoris 2+ 1+ Nml Nml Nml Nml All 1+ All 1- All 1+ N/A  Left Iliacus 1- Nml Nml Nml Nml Nml Most 1+ Most 1+ Most 1+ N/A      Waveforms:

## 2019-07-12 NOTE — Progress Notes (Signed)
Follow-up Visit   Date: 07/12/19   Andrea Delacruz MRN: [id number] DOB: Dec 31, 1960   Interim History: Andrea Delacruz is a 59 y.o. right-handed African American female with hypothyroidism, diabetes mellitus (HbA1c 7.2), hypertension, and overactive bladder returning to the clinic for follow-up of hyperCKemia and generalized weakness.  The patient was accompanied to the clinic by self..  History of present illness: Starting around November 2020, she began noticing difficulty climbing stairs, which gradually progressed to where she now has to manually lift her legs to place them in the car.  She also has weakness in the arm, especially raising them.  She denies any pain, cramps, dark-colored urine.  She denies problems with double vision, swallowing, or slurred speech.  She takes lipitor 62m and has been on this for the past 3 years.   She tried physical therapy which did not help.  She had extensive evaluation which inlcuded neuroimaging of the neuroaxis which shows cervical spondylosis with spinal stenosis at C3-4 through C6-7, no cord compression. She has degenerative changes in the lumbar spine at L5 nerve root, but this would not explain her hip flexor weakness.  Given the inconsistency with her exam/history and imaging findings, Dr. DLynann Bolognareferred her to see me for neurological evaluation.   Of note, her LFTs have been elevated in the 200s and she was told she has mild fatty liver. CK level has not been checked.  UPDATE 07/12/2019:  She is here for electrodiagnostic testing and review labs.  Her labs showed markedly elevated CK of 19,000 with normal inflammatory markers.  She does not have dark colored urine, but reports reduced urine output. Renal function is within normal limits.  Her LFTs are elevated and likely due to CK.  Myositis antibody panel is pending.   She continues to have progressive weakness of the arms and legs.  It is harder for her to walk and she is sleeping in  a recliner, because of difficulty trying to move her legs in bed. She does not have shortness of breath, difficulty swallowing/talking. She has been off lipitor since her last visit with me on 2/8.  Medications:  Current Outpatient Medications on File Prior to Visit  Medication Sig Dispense Refill  . cholecalciferol (VITAMIN D3) 25 MCG (1000 UT) tablet Take 1,000 Units by mouth daily.    . hydrochlorothiazide (HYDRODIURIL) 25 MG tablet Take 25 mg by mouth daily.     .Marland Kitchenlisinopril (PRINIVIL,ZESTRIL) 10 MG tablet Take 10 mg by mouth daily.    . metFORMIN (GLUCOPHAGE) 500 MG tablet Take 500 mg by mouth 2 (two) times daily. Take 1 tablet (5021mtotal) by mouth twice daily.    . mirabegron ER (MYRBETRIQ) 50 MG TB24 tablet Take 50 mg by mouth daily.    . Potassium 99 MG TABS Take 1 tablet by mouth daily.    . Marland KitchenIROSINT 150 MCG CAPS Take 1 capsule (150 mcg total) by mouth daily before breakfast. 30 capsule 2  . Turmeric 500 MG TABS Take 1 tablet by mouth daily.    . vitamin B-12 (CYANOCOBALAMIN) 1000 MCG tablet Take 1,000 mcg by mouth daily.     No current facility-administered medications on file prior to visit.    Allergies: No Known Allergies  Vital Signs:  BP 119/77 (BP Location: Left Arm, Patient Position: Sitting)   Pulse 98   Resp 14   Ht 5' 9" (1.753 m)   Wt 218 lb 9.6 oz (99.2 kg)   SpO2 99%  BMI 32.28 kg/m   Neurological Exam: MENTAL STATUS including orientation to time, place, person, recent and remote memory, attention span and concentration, language, and fund of knowledge is normal.  Speech is not dysarthric.  CRANIAL NERVES:  Pupils equal round and reactive to light.  Normal conjugate, extra-ocular eye movements in all directions of gaze.  No ptosis. No facial weakness.  MOTOR:  No atrophy, fasciculations or abnormal movements.  No pronator drift.  Neck flexion is 4+/5 (worse) Upper Extremity:  Right  Left  Deltoid  4/5   4/5   Biceps  5-/5   5-/5   Triceps  5-/5   5-/5    Infraspinatus 4/5  4/5  Medial pectoralis 4/5  4/5  Wrist extensors  5/5   5/5   Wrist flexors  5/5   5/5   Finger extensors  5/5   5/5   Finger flexors  5/5   5/5   Dorsal interossei  5/5   5/5   Abductor pollicis  5/5   5/5   Tone (Ashworth scale)  0  0   Lower Extremity:  Right  Left  Hip flexors  3/5   3/5   Hip extensors  4/5   4/5   Adductor 4/5  4/5  Abductor 4/5  4/5  Knee flexors  5/5   5/5   Knee extensors  5/5   5/5   Dorsiflexors  5/5   5/5   Plantarflexors  5/5   5/5   Toe extensors  5/5   5/5   Toe flexors  5/5   5/5   Tone (Ashworth scale)  0  0     COORDINATION/GAIT:  Normal finger-to- nose-finger.  Intact rapid alternating movements bilaterally.  Gait narrow based and stable.   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  IMPRESSION/PLAN: Proximal myopathy manifesting with progressive weakness and non-traumatic rhabdomyolysis.  CK 19,000+, normal renal function.  Results of EMG discussed which shows necrotizing myopathy. 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  She was previously on lipitor 36m which was stopped at her last visit.  Her myositis panel is pending.  I have also ordered send-out Myopathy 2 panel as a send out to [facility], which includes HMGCR antibody. With her progressive weakness and elevated CK, I have recommended that she go to the ER for admission.  She will need IV hydration, monitoring of labs, and expedited muscle biopsy of the right deltoid and right  rectus femoris muscle. Following biopsy, high dose steroids will likely be initiated while monitoring her blood glucose, as she is diabetic.   Thank you for allowing me to participate in patient's care.  If I can answer any additional questions, I would be pleased to do so.    Sincerely,    Donika K. PPosey Pronto DO

## 2019-07-12 NOTE — Patient Instructions (Signed)
Your testing today shows that there is diffuse muscle injury in the upper arms and legs, this is also supported by elevated muscle enzymes (CK).  I will check a send-out lab test today To expedite your work-up, please go to the ER so you can be admitted for hydration and muscle biopsy

## 2019-07-13 ENCOUNTER — Other Ambulatory Visit: Payer: Self-pay

## 2019-07-13 ENCOUNTER — Encounter: Payer: BC Managed Care – PPO | Admitting: Neurology

## 2019-07-13 ENCOUNTER — Encounter (HOSPITAL_COMMUNITY): Payer: Self-pay

## 2019-07-13 ENCOUNTER — Inpatient Hospital Stay (HOSPITAL_COMMUNITY)
Admission: EM | Admit: 2019-07-13 | Discharge: 2019-07-16 | DRG: 501 | Disposition: A | Discharge status: Home Health | Payer: BC Managed Care – PPO | Attending: Internal Medicine | Admitting: Internal Medicine

## 2019-07-13 DIAGNOSIS — Z79899 Other long term (current) drug therapy: Secondary | ICD-10-CM | POA: Diagnosis not present

## 2019-07-13 DIAGNOSIS — R748 Abnormal levels of other serum enzymes: Secondary | ICD-10-CM | POA: Diagnosis present

## 2019-07-13 DIAGNOSIS — N3281 Overactive bladder: Secondary | ICD-10-CM | POA: Diagnosis present

## 2019-07-13 DIAGNOSIS — M6080 Other myositis, unspecified site: Principal | ICD-10-CM | POA: Diagnosis present

## 2019-07-13 DIAGNOSIS — M609 Myositis, unspecified: Secondary | ICD-10-CM

## 2019-07-13 DIAGNOSIS — Z833 Family history of diabetes mellitus: Secondary | ICD-10-CM

## 2019-07-13 DIAGNOSIS — E039 Hypothyroidism, unspecified: Secondary | ICD-10-CM | POA: Diagnosis present

## 2019-07-13 DIAGNOSIS — E119 Type 2 diabetes mellitus without complications: Secondary | ICD-10-CM | POA: Diagnosis present

## 2019-07-13 DIAGNOSIS — Z8249 Family history of ischemic heart disease and other diseases of the circulatory system: Secondary | ICD-10-CM | POA: Diagnosis not present

## 2019-07-13 DIAGNOSIS — Z7984 Long term (current) use of oral hypoglycemic drugs: Secondary | ICD-10-CM

## 2019-07-13 DIAGNOSIS — M6282 Rhabdomyolysis: Secondary | ICD-10-CM | POA: Diagnosis present

## 2019-07-13 DIAGNOSIS — Z823 Family history of stroke: Secondary | ICD-10-CM | POA: Diagnosis not present

## 2019-07-13 DIAGNOSIS — I10 Essential (primary) hypertension: Secondary | ICD-10-CM | POA: Diagnosis present

## 2019-07-13 DIAGNOSIS — Z20822 Contact with and (suspected) exposure to covid-19: Secondary | ICD-10-CM | POA: Diagnosis present

## 2019-07-13 HISTORY — DX: Other specified postprocedural states: R11.2

## 2019-07-13 HISTORY — DX: Other specified postprocedural states: Z98.890

## 2019-07-13 HISTORY — DX: Other complications of anesthesia, initial encounter: T88.59XA

## 2019-07-13 LAB — CBC WITH DIFFERENTIAL/PLATELET
Abs Immature Granulocytes: 0.01 10*3/uL (ref 0.00–0.07)
Basophils Absolute: 0.1 10*3/uL (ref 0.0–0.1)
Basophils Relative: 1 %
Eosinophils Absolute: 0.1 10*3/uL (ref 0.0–0.5)
Eosinophils Relative: 2 %
HCT: 38.6 % (ref 36.0–46.0)
Hemoglobin: 12 g/dL (ref 12.0–15.0)
Immature Granulocytes: 0 %
Lymphocytes Relative: 41 %
Lymphs Abs: 2 10*3/uL (ref 0.7–4.0)
MCH: 28.4 pg (ref 26.0–34.0)
MCHC: 31.1 g/dL (ref 30.0–36.0)
MCV: 91.3 fL (ref 80.0–100.0)
Monocytes Absolute: 0.4 10*3/uL (ref 0.1–1.0)
Monocytes Relative: 8 %
Neutro Abs: 2.3 10*3/uL (ref 1.7–7.7)
Neutrophils Relative %: 48 %
Platelets: 346 10*3/uL (ref 150–400)
RBC: 4.23 MIL/uL (ref 3.87–5.11)
RDW: 15.7 % — ABNORMAL HIGH (ref 11.5–15.5)
WBC: 4.8 10*3/uL (ref 4.0–10.5)
nRBC: 0 % (ref 0.0–0.2)

## 2019-07-13 LAB — COMPREHENSIVE METABOLIC PANEL
ALT: 304 U/L — ABNORMAL HIGH (ref 0–44)
AST: 281 U/L — ABNORMAL HIGH (ref 15–41)
Albumin: 3.9 g/dL (ref 3.5–5.0)
Alkaline Phosphatase: 66 U/L (ref 38–126)
Anion gap: 8 (ref 5–15)
BUN: 5 mg/dL — ABNORMAL LOW (ref 6–20)
CO2: 29 mmol/L (ref 22–32)
Calcium: 9.8 mg/dL (ref 8.9–10.3)
Chloride: 101 mmol/L (ref 98–111)
Creatinine, Ser: 0.58 mg/dL (ref 0.44–1.00)
GFR calc Af Amer: >60 mL/min (ref >60)
GFR calc non Af Amer: >60 mL/min (ref >60)
Glucose, Bld: 112 mg/dL — ABNORMAL HIGH (ref 70–99)
Potassium: 3.9 mmol/L (ref 3.5–5.1)
Sodium: 138 mmol/L (ref 135–145)
Total Bilirubin: 0.6 mg/dL (ref 0.3–1.2)
Total Protein: 6.6 g/dL (ref 6.5–8.1)

## 2019-07-13 LAB — URINALYSIS, ROUTINE W REFLEX MICROSCOPIC
Bilirubin Urine: NEGATIVE
Glucose, UA: NEGATIVE mg/dL
Hgb urine dipstick: NEGATIVE
Ketones, ur: NEGATIVE mg/dL
Leukocytes,Ua: NEGATIVE
Nitrite: NEGATIVE
Protein, ur: NEGATIVE mg/dL
Specific Gravity, Urine: 1.009 (ref 1.005–1.030)
pH: 7 (ref 5.0–8.0)

## 2019-07-13 LAB — PROTIME-INR
INR: 1 (ref 0.8–1.2)
Prothrombin Time: 13.1 seconds (ref 11.4–15.2)

## 2019-07-13 LAB — LACTATE DEHYDROGENASE: LDH: 1280 U/L — ABNORMAL HIGH (ref 98–192)

## 2019-07-13 LAB — RESPIRATORY PANEL BY RT PCR (FLU A&B, COVID)
Influenza A by PCR: NEGATIVE
Influenza B by PCR: NEGATIVE
SARS Coronavirus 2 by RT PCR: NEGATIVE

## 2019-07-13 LAB — SEDIMENTATION RATE: Sed Rate: 12 mm/hr (ref 0–22)

## 2019-07-13 LAB — HIV ANTIBODY (ROUTINE TESTING W REFLEX): HIV Screen 4th Generation wRfx: NONREACTIVE

## 2019-07-13 LAB — C-REACTIVE PROTEIN: CRP: <0.5 mg/dL (ref <1.0)

## 2019-07-13 LAB — CK: Total CK: 20712 U/L — ABNORMAL HIGH (ref 38–234)

## 2019-07-13 LAB — GLUCOSE, CAPILLARY: Glucose-Capillary: 87 mg/dL (ref 70–99)

## 2019-07-13 MED ORDER — METFORMIN HCL 500 MG PO TABS
1000.0000 mg | ORAL_TABLET | Freq: Every day | ORAL | Status: DC
  Administered 2019-07-14: 1000 mg via ORAL
  Filled 2019-07-13: qty 2

## 2019-07-13 MED ORDER — POTASSIUM CHLORIDE IN NACL 20-0.9 MEQ/L-% IV SOLN
INTRAVENOUS | Status: DC
  Filled 2019-07-13: qty 1000

## 2019-07-13 MED ORDER — LISINOPRIL 10 MG PO TABS
10.0000 mg | ORAL_TABLET | Freq: Every day | ORAL | Status: DC
  Administered 2019-07-14 – 2019-07-16 (×2): 10 mg via ORAL
  Filled 2019-07-13 (×2): qty 1

## 2019-07-13 MED ORDER — VITAMIN D 25 MCG (1000 UNIT) PO TABS
1000.0000 [IU] | ORAL_TABLET | Freq: Every day | ORAL | Status: DC
  Administered 2019-07-13 – 2019-07-16 (×3): 1000 [IU] via ORAL
  Filled 2019-07-13 (×3): qty 1

## 2019-07-13 MED ORDER — ACETAMINOPHEN 500 MG PO TABS
500.0000 mg | ORAL_TABLET | Freq: Four times a day (QID) | ORAL | Status: DC | PRN
  Administered 2019-07-15 – 2019-07-16 (×2): 500 mg via ORAL
  Filled 2019-07-13 (×2): qty 1

## 2019-07-13 MED ORDER — MIRABEGRON ER 50 MG PO TB24
50.0000 mg | ORAL_TABLET | Freq: Every day | ORAL | Status: DC
  Administered 2019-07-14 – 2019-07-16 (×2): 50 mg via ORAL
  Filled 2019-07-13 (×3): qty 1

## 2019-07-13 MED ORDER — METFORMIN HCL 500 MG PO TABS
500.0000 mg | ORAL_TABLET | Freq: Every day | ORAL | Status: DC
  Administered 2019-07-13: 16:00:00 500 mg via ORAL
  Filled 2019-07-13: qty 1

## 2019-07-13 MED ORDER — STERILE WATER FOR INJECTION IV SOLN
INTRAVENOUS | Status: DC
  Filled 2019-07-13 (×7): qty 850

## 2019-07-13 MED ORDER — PREDNISONE 20 MG PO TABS: 50.0000 mg | ORAL_TABLET | Freq: Two times a day (BID) | ORAL | Status: DC

## 2019-07-13 MED ORDER — CEFAZOLIN SODIUM-DEXTROSE 2-4 GM/100ML-% IV SOLN
2.0000 g | INTRAVENOUS | Status: DC
  Filled 2019-07-13 (×2): qty 100

## 2019-07-13 MED ORDER — LEVOTHYROXINE SODIUM 150 MCG PO TABS
150.0000 ug | ORAL_TABLET | Freq: Every day | ORAL | Status: DC
  Administered 2019-07-16: 150 ug via ORAL
  Filled 2019-07-13: qty 1
  Filled 2019-07-13: qty 2
  Filled 2019-07-13 (×2): qty 1

## 2019-07-13 MED ORDER — SENNOSIDES-DOCUSATE SODIUM 8.6-50 MG PO TABS: 1.0000 | ORAL_TABLET | Freq: Every evening | ORAL | Status: DC | PRN

## 2019-07-13 MED ORDER — HYDROCHLOROTHIAZIDE 25 MG PO TABS
25.0000 mg | ORAL_TABLET | Freq: Every day | ORAL | Status: DC
  Administered 2019-07-14 – 2019-07-16 (×2): 25 mg via ORAL
  Filled 2019-07-13 (×2): qty 1

## 2019-07-13 NOTE — H&P (Signed)
History and Physical    Andrea Delacruz IDC:[id number] DOB: March 03, 1961 DOA: 07/13/2019  PCP: Juluis Rainier, MD (Confirm with patient/family/NH records and if not entered, this has to be entered at [facility] point of entry) Patient coming from: Home  I have personally briefly reviewed patient's old medical records in [facility]  Chief Complaint: Feeling tired  HPI: Andrea Delacruz is a 59 y.o. female with medical history significant of thyroid disease s/p radioactive ablation more than 30 years ago, obesity, hypertension and IIDM presented with increasing bilateral leg weakness of bilateral shoulders weakness for 49-months. She is followed by Dr. Evelina Bucy neurology.  PCP Dr. Allena Katz sent patient to ED to be further evaluated by neurology, muscle biopsy and significant hydration due to elevated CK and concern for myositis.  Started to feeling difficulty climbing up stairs since around November 2020, and gradually getting worse, now has to pull up legs to climb stairs, also much difficulty raising her arms above her shoulders.  She denies any pain, cramps, dark-colored urine.  She said her symptoms were more severe in the late part of the day, denied any short of breath, no swallowing problems, no constipation.  Denied any double vision or blurry vision.  Denied any rash. ED Course: Neurology consultation required, who contacted on-call surgeon, decided to perform muscle biopsy tomorrow  Review of Systems: As per HPI otherwise 10 point review of systems negative.    Past Medical History:  Diagnosis Date  . Diabetes mellitus without complication (HCC)   . Hypertension   . Obesity   . Thyroid disease    Hyperactive then was treated with iodine.     Past Surgical History:  Procedure Laterality Date  . BREAST REDUCTION SURGERY  [id number]     reports that she has never smoked. She has never used smokeless tobacco. She reports current alcohol use. She reports that she does not use  drugs.  No Known Allergies  Family History  Problem Relation Age of Onset  . Stroke Mother   . Diabetes Mother   . Diabetes Father   . Diabetes Sister   . Hypertension Sister   . Hypertension Maternal Aunt   . Cancer Maternal Uncle   . Diabetes Paternal Aunt   . Diabetes Paternal Uncle   . Cancer Maternal Grandmother      Prior to Admission medications   Medication Sig Start Date End Date Taking? Authorizing Provider  acetaminophen (TYLENOL) 500 MG tablet Take 500 mg by mouth every 6 (six) hours as needed for mild pain or headache.   Yes [provider]  cholecalciferol (VITAMIN D3) 25 MCG (1000 UNIT) tablet Take 1,000 Units by mouth daily.   Yes [provider]  hydrochlorothiazide (HYDRODIURIL) 25 MG tablet Take 25 mg by mouth daily.  06/28/12  Yes [provider]  lisinopril (PRINIVIL,ZESTRIL) 10 MG tablet Take 10 mg by mouth daily.   Yes [provider]  metFORMIN (GLUCOPHAGE) 500 MG tablet Take 500-1,000 mg by mouth See admin instructions. Taking 2 tabs (1000mg ) in the AM and 1 tab (500mg ) in the evening 04/12/17  Yes [provider]  mirabegron ER (MYRBETRIQ) 50 MG TB24 tablet Take 50 mg by mouth daily.   Yes [provider]  TIROSINT 150 MCG CAPS Take 1 capsule (150 mcg total) by mouth daily before breakfast. 06/23/19  Yes 04/14/17, MD    Physical Exam: Vitals:   07/13/19 1345 07/13/19 1400 07/13/19 1415 07/13/19 1522  BP: 116/85 120/83 115/64  118/76  Pulse: 82 86  86  Resp: 13 17 19 16   Temp:    97.9 F (36.6 C)  TempSrc:    Oral  SpO2: 97% 99%  99%    Constitutional: NAD, calm, comfortable Vitals:   07/13/19 1345 07/13/19 1400 07/13/19 1415 07/13/19 1522  BP: 116/85 120/83 115/64 118/76  Pulse: 82 86  86  Resp: 13 17 19 16   Temp:    97.9 F (36.6 C)  TempSrc:    Oral  SpO2: 97% 99%  99%   Eyes: PERRL, lids and conjunctivae normal ENMT: Mucous membranes are moist. Posterior pharynx clear of any exudate  or lesions.Normal dentition.  Neck: normal, supple, no masses, no thyromegaly Respiratory: clear to auscultation bilaterally, no wheezing, no crackles. Normal respiratory effort. No accessory muscle use.  Cardiovascular: Regular rate and rhythm, no murmurs / rubs / gallops. No extremity edema. 2+ pedal pulses. No carotid bruits.  Abdomen: no tenderness, no masses palpated. No hepatosplenomegaly. Bowel sounds positive.  Musculoskeletal: no clubbing / cyanosis. No joint deformity upper and lower extremities. Good ROM, no contractures. Normal muscle tone.  Skin: no rashes, lesions, ulcers. No induration Neurologic: CN 2-12 grossly intact. Sensation intact, DTR normal. Strength 5/5 in all 4.  Psychiatric: Normal judgment and insight. Alert and oriented x 3. Normal mood.     Labs on Admission: I have personally reviewed following labs and imaging studies  CBC: Recent Labs  Lab 07/13/19 1020  WBC 4.8  NEUTROABS 2.3  HGB 12.0  HCT 38.6  MCV 91.3  PLT 346   Basic Metabolic Panel: Recent Labs  Lab 07/08/19 1355 07/13/19 1020  NA 138 138  K 4.6 3.9  CL 98 101  CO2 26 29  GLUCOSE 71 112*  BUN 6 5*  CREATININE 0.58 0.58  CALCIUM 10.4* 9.8   GFR: Estimated Creatinine Clearance: 94.9 mL/min (by C-G formula based on SCr of 0.58 mg/dL). Liver Function Tests: Recent Labs  Lab 07/08/19 1355 07/13/19 1020  AST 411* 281*  ALT 434* 304*  ALKPHOS 101 66  BILITOT 0.5 0.6  PROT 7.2 6.6  ALBUMIN 4.5 3.9   No results for input(s): LIPASE, AMYLASE in the last 168 hours. No results for input(s): AMMONIA in the last 168 hours. Coagulation Profile: Recent Labs  Lab 07/13/19 1020  INR 1.0   Cardiac Enzymes: Recent Labs  Lab 07/13/19 1020  CKTOTAL 20,712*   BNP (last 3 results) No results for input(s): PROBNP in the last 8760 hours. HbA1C: No results for input(s): HGBA1C in the last 72 hours. CBG: Recent Labs  Lab 07/13/19 1511  GLUCAP 87   Lipid Profile: No results for  input(s): CHOL, HDL, LDLCALC, TRIG, CHOLHDL, LDLDIRECT in the last 72 hours. Thyroid Function Tests: No results for input(s): TSH, T4TOTAL, FREET4, T3FREE, THYROIDAB in the last 72 hours. Anemia Panel: No results for input(s): VITAMINB12, FOLATE, FERRITIN, TIBC, IRON, RETICCTPCT in the last 72 hours. Urine analysis:    Component Value Date/Time   COLORURINE YELLOW 07/13/2019 0948   APPEARANCEUR CLEAR 07/13/2019 0948   LABSPEC 1.009 07/13/2019 0948   PHURINE 7.0 07/13/2019 0948   GLUCOSEU NEGATIVE 07/13/2019 0948   HGBUR NEGATIVE 07/13/2019 0948   BILIRUBINUR NEGATIVE 07/13/2019 0948   KETONESUR NEGATIVE 07/13/2019 0948   PROTEINUR NEGATIVE 07/13/2019 0948   NITRITE NEGATIVE 07/13/2019 0948   LEUKOCYTESUR NEGATIVE 07/13/2019 0948    Radiological Exams on Admission: NCV with EMG(electromyography)  Result Date: 07/12/2019 07/15/2019, DO     07/12/2019  1:07 PM [facility] [address] [place], [address]  [place], [place] [address] Tel: [phone] Fax:  [phone] Test Date:  07/12/2019 Patient: Annlee Glandon DOB: September 01, 1960 Physician: Nita Sickle, DO Sex: Female Height: ' 0" Ref Phys: Nita Sickle, DO ID#: [id number] Temp: 32.0C Technician:  Patient Complaints: This is a 59 year old female with hyperCKemia and generalized weakness referred for evaluation of myopathy. NCV & EMG Findings: Extensive electrodiagnostic testing of the left upper and lower extremity shows: 1. Left median and sural sensory responses are within normal limits. 2. The left median, ulnar, and peroneal motor responses are within normal limits. 3. Diffuse small, complex motor units are seen affecting the pronator teres, brachioradialis, biceps, triceps, deltoid, infraspinatus, gluteus medius, rectus femoris, iliacus, and medial gastrocnemius muscles with evidence of muscle membrane irritability. Impression: The electrophysiologic findings are suggestive of a necrotizing myopathy affecting the proximal arm and  leg muscles. ___________________________ Nita Sickle, DO Nerve Conduction Studies Anti Sensory Summary Table  Site NR Peak (ms) Norm Peak (ms) P-T Amp (V) Norm P-T Amp Left Median Anti Sensory (2nd Digit)  32C Wrist    3.3 <3.6 41.3 >15 Left Sural Anti Sensory (Lat Mall)  32C Calf    2.7 <4.6 17.4 >4 Motor Summary Table  Site NR Onset (ms) Norm Onset (ms) O-P Amp (mV) Norm O-P Amp Site1 Site2 Delta-0 (ms) Dist (cm) Vel (m/s) Norm Vel (m/s) Left Median Motor (Abd Poll Brev)  32C Wrist    3.1 <4.0 8.8 >6 Elbow Wrist 4.7 28.0 60 >50 Elbow    7.8  8.8        Left Peroneal Motor (Ext Dig Brev)  32C Ankle    3.8 <6.0 5.2 >2.5 B Fib Ankle 6.8 35.0 51 >40 B Fib    10.6  4.6  Poplt B Fib 1.4 9.0 64 >40 Poplt    12.0  4.2        Left Peroneal TA Motor (Tib Ant)  32C Fib Head    2.5 <4.5 4.0 >3 Poplit Fib Head 1.4 9.0 64 >40 Poplit    3.9  4.0        Left Ulnar Motor (Abd Dig Minimi)  32C Wrist    2.7 <3.1 11.1 >7 B Elbow Wrist 3.9 23.0 59 >50 B Elbow    6.6  10.7  A Elbow B Elbow 2.0 10.0 50 >50 A Elbow    8.6  10.2        EMG  Side Muscle Ins Act Fibs Psw Fasc Number Recrt Dur Dur. Amp Amp. Poly Poly. Comment Right BicepsFemS Nml Nml Nml Nml Nml Nml Nml Nml Nml Nml Nml Nml N/A Left 1stDorInt Nml Nml Nml Nml Nml Nml Nml Nml Nml Nml Nml Nml N/A Left BrachioRad 1+ 1+ Nml Nml Nml Nml Some 1+ Some 1- Some 1+ E.R. Left FlexPolLong Nml Nml Nml Nml Nml Nml Nml Nml Nml Nml Nml Nml N/A Left PronatorTeres Nml Nml Nml Nml Nml Nml Few 1+ Few 1- Few 1+ N/A Left Biceps Nml 2+ Nml Nml Nml Nml Many 1+ Many 1- Many 1+ E.R. Left Triceps Nml 1+ Nml Nml Nml Nml Some 1+ Some 1- Some 1+ E.R. Left Deltoid Nml 1+ Nml Nml Nml Nml Many 1+ Many 1- Many 1+ E.R. Left Cervical Parasp Low Nml 1+ Nml Nml NE Nml - - - - - - N/A Left Infraspinatus 2+ 1+ Nml Nml Nml Nml Most 1+ Most 1- Most 1+ E.R. Left GluteusMed 2+ 1+ Nml Nml Nml  Nml All 1+ All 1+ All 1+ E.R. Left AntTibialis Nml Nml Nml Nml Nml Nml Nml Nml Nml Nml Nml Nml N/A Left Gastroc Nml 1+ Nml  Nml Nml Nml Few 1+ Few 1- Few 1+ N/A Left RectFemoris 2+ 1+ Nml Nml Nml Nml All 1+ All 1- All 1+ N/A Left Iliacus 1- Nml Nml Nml Nml Nml Most 1+ Most 1+ Most 1+ N/A Waveforms:          EKG: Independently reviewed. NSR  Assessment/Plan Active Problems:   Myositis   Rhabdomyolysis  Rhabdomyolysis secondary to likely myositis Her PCP sent out myositis panel yesterday. Neurology on board, muscle biopsy will be performed tomorrow. Start prednisone 1 mg/kg divided into twice daily, started tomorrow afternoon after biopsy. Bicarb drip at 125 Blood work showed no significant hyperkalemia, no significant AKI. Daily BMP. Place patient on MedSurg for today and tomorrow, likely will need 2 to 4 days hospital stay for IV hydration until CK level drops to a safe level.  Presently her urine does not show any myoglobin? Continue to hold statin which was done by her PCP.  IIDM Continue Metformin  HTN Continue home meds.  DVT prophylaxis: SCD and ambulation Code Status: Full Code Family Communication: None at bedside Disposition Plan: MedSurg, likely will need 2 to 4 days hospital stay depends on mainly CK level Consults called: Neurology Dr. Malen Gauze, on-call general surgeon Admission status: MedSurg   Lequita Halt MD [facility] Pager [phone]    07/13/2019, 5:00 PM

## 2019-07-13 NOTE — ED Triage Notes (Addendum)
Per pt: She has been having bilateral arm and leg weakness for 4 months, she sees neurology for same. She had "a test done" yesterday and the her dr called her and had her come to the ED due to the results. She states that her doctor wants her admitted so that she can have biopsies done. Note from Dr. Allena Katz states pts CK was 19,000+, recent lab results are available in chart.

## 2019-07-13 NOTE — ED Notes (Signed)
Patient is back in bed on the monitor call bell in reach

## 2019-07-13 NOTE — Consult Note (Signed)
[facility] Surgery Consult  Note  Andrea Delacruz 29-Jul-1960  [id number].    Requesting MD:  Dr. Wilford Corner Chief Complaint:  Muscle weakness Reason for Consult: Muscle biopsy   HPI:  Patient is a 59 year old right-handed female with a history of hypothyroidism, diabetes, hypertension and overactive bladder who was referred to Dr. Nita Sickle neurology service on 2/23.  She started complaining of generalized arm and leg weakness starting in November 2020.  It became difficult to climb stairs, this gradually progressed to where she now has to manually lift her legs to place them in the car.  She also has difficulty raising her arms.  Symptoms were initially worse on the left side, but now she says both the left and the right side are equal.  She denies any problems with double vision, swallowing, slurred speech.  she has been on Lipitor 40 mg daily for the past 3 years.  She has attempted physical therapy and has had extensive neuro imaging.  It is Dr. Eliane Decree opinion that this was most likely a myopathy, autoimmune versus statin induced.  Especially with her elevated transaminitis.  She was being screened for myasthenia gravis.  She was seen yesterday by Dr. Allena Katz.  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  Labs previously obtained on 07/04/2019 shows a CK of 19,782.  AST of 411 and ALT of 434 on 07/08/2019.  Today she was called by Dr. Allena Katz and told to go to the emergency department for further treatment and evaluation.  In Dr. Eliane Decree note yesterday she thinks the patient has a proximal myopathy manifesting with progressive weakness and nontraumatic rhabdomyolysis.  EMG shows a necrotizing myopathy.  She is recommended IV hydration for her rhabdomyolysis and is requesting a right deltoid and right rectus femoris muscle biopsy.  We are ask to see for muscle biopsy.     ROS: Review of Systems  Constitutional: Negative.   HENT: Negative.    Eyes: Negative.   Respiratory: Negative.   Cardiovascular: Negative.   Gastrointestinal: Positive for constipation (stools have been harder and small recently). Negative for abdominal pain, blood in stool, diarrhea, heartburn, nausea and vomiting.  Genitourinary: Negative.   Musculoskeletal:       Some aching and more loss of proximal muscle control both  Lower legs, and some with both arms.    Skin: Negative.   Neurological: Negative.   Endo/Heme/Allergies: Negative.   Psychiatric/Behavioral: Positive for depression (she just retired and is anxious over her body giving out.  ).    Family History  Problem Relation Age of Onset  . Stroke Mother   . Diabetes Mother   . Diabetes Father   . Diabetes Sister   . Hypertension Sister   . Hypertension Maternal Aunt   . Cancer Maternal Uncle   . Diabetes Paternal Aunt   . Diabetes Paternal Uncle   . Cancer Maternal Grandmother     Past Medical History:  Diagnosis Date  . Diabetes mellitus without complication (HCC)   . Hypertension   . Obesity   . Thyroid disease    Hyperactive then was treated with iodine.     Past Surgical History:  Procedure Laterality Date  . BREAST REDUCTION SURGERY  [id number]    Social History:  reports that she has never smoked. She has never used smokeless tobacco. She reports current alcohol use. She reports that she does not use drugs.  Allergies: No Known Allergies  Prior to Admission  medications   Medication Sig Start Date End Date Taking? Authorizing Provider  acetaminophen (TYLENOL) 500 MG tablet Take 500 mg by mouth every 6 (six) hours as needed for mild pain or headache.   Yes [provider]  cholecalciferol (VITAMIN D3) 25 MCG (1000 UNIT) tablet Take 1,000 Units by mouth daily.   Yes [provider]  hydrochlorothiazide (HYDRODIURIL) 25 MG tablet Take 25 mg by mouth daily.  06/28/12  Yes [provider]  lisinopril (PRINIVIL,ZESTRIL) 10 MG tablet Take 10 mg by mouth  daily.   Yes [provider]  metFORMIN (GLUCOPHAGE) 500 MG tablet Take 500-1,000 mg by mouth See admin instructions. Taking 2 tabs (1000mg ) in the AM and 1 tab (500mg ) in the evening 04/12/17  Yes [provider]  mirabegron ER (MYRBETRIQ) 50 MG TB24 tablet Take 50 mg by mouth daily.   Yes [provider]  TIROSINT 150 MCG CAPS Take 1 capsule (150 mcg total) by mouth daily before breakfast. 06/23/19  Yes [name], MD     Blood pressure 120/74, pulse 70, temperature 98.7 F (37.1 C), temperature source Oral, resp. rate 18, SpO2 96 %. Physical Exam:  General: pleasant, WD, WN woman  who is laying in bed in NAD HEENT: head is normocephalic, atraumatic.  Sclera are noninjected.  Pupils are equal.   Mouth is pink and moist Heart: regular, rate, and rhythm.  Normal s1,s2. No obvious murmurs, gallops, or rubs noted.  Palpable radial and pedal pulses bilaterally Lungs: CTAB, no wheezes, rhonchi, or rales noted.  Respiratory effort nonlabored Abd: soft, NT, ND, +BS, no masses, hernias, or organomegaly MS: She can lift both arms up to around her ears, but no further.  She can lift her legs, but needs to use her arms to move proximal thighs.  Skin: warm and dry with no masses, lesions, or rashes Neuro: Cranial nerves 2-12 grossly intact, speech is normal Psych: A&Ox3 with an appropriate affect.   Results for orders placed or performed during the hospital encounter of 07/13/19 (from the past 48 hour(s))  Urinalysis, Routine w reflex microscopic     Status: None   Collection Time: 07/13/19  9:48 AM  Result Value Ref Range   Color, Urine YELLOW YELLOW   APPearance CLEAR CLEAR   Specific Gravity, Urine 1.009 1.005 - 1.030   pH 7.0 5.0 - 8.0   Glucose, UA NEGATIVE NEGATIVE mg/dL   Hgb urine dipstick NEGATIVE NEGATIVE   Bilirubin Urine NEGATIVE NEGATIVE   Ketones, ur NEGATIVE NEGATIVE mg/dL   Protein, ur NEGATIVE NEGATIVE mg/dL   Nitrite NEGATIVE NEGATIVE   Leukocytes,Ua  NEGATIVE NEGATIVE    Comment: Performed at [facility] [address] [place], [place] [address]  Comprehensive metabolic panel     Status: Abnormal   Collection Time: 07/13/19 10:20 AM  Result Value Ref Range   Sodium 138 135 - 145 mmol/L   Potassium 3.9 3.5 - 5.1 mmol/L   Chloride 101 98 - 111 mmol/L   CO2 29 22 - 32 mmol/L   Glucose, Bld 112 (H) 70 - 99 mg/dL    Comment: Glucose reference range applies only to samples taken after fasting for at least 8 hours.   BUN 5 (L) 6 - 20 mg/dL   Creatinine, Ser 0.58 0.44 - 1.00 mg/dL   Calcium 9.8 8.9 - 10.3 mg/dL   Total Protein 6.6 6.5 - 8.1 g/dL   Albumin 3.9 3.5 - 5.0 g/dL   AST 281 (H) 15 -  41 U/L   ALT 304 (H) 0 - 44 U/L   Alkaline Phosphatase 66 38 - 126 U/L   Total Bilirubin 0.6 0.3 - 1.2 mg/dL   GFR calc non Af Amer >60 >60 mL/min   GFR calc Af Amer >60 >60 mL/min   Anion gap 8 5 - 15    Comment: Performed at [facility], [address], [place], [place] [address]  CBC with Differential     Status: Abnormal   Collection Time: 07/13/19 10:20 AM  Result Value Ref Range   WBC 4.8 4.0 - 10.5 K/uL   RBC 4.23 3.87 - 5.11 MIL/uL   Hemoglobin 12.0 12.0 - 15.0 g/dL   HCT 17.7 93.9 - 03.0 %   MCV 91.3 80.0 - 100.0 fL   MCH 28.4 26.0 - 34.0 pg   MCHC 31.1 30.0 - 36.0 g/dL   RDW 09.2 (H) 33.0 - 07.6 %   Platelets 346 150 - 400 K/uL   nRBC 0.0 0.0 - 0.2 %   Neutrophils Relative % 48 %   Neutro Abs 2.3 1.7 - 7.7 K/uL   Lymphocytes Relative 41 %   Lymphs Abs 2.0 0.7 - 4.0 K/uL   Monocytes Relative 8 %   Monocytes Absolute 0.4 0.1 - 1.0 K/uL   Eosinophils Relative 2 %   Eosinophils Absolute 0.1 0.0 - 0.5 K/uL   Basophils Relative 1 %   Basophils Absolute 0.1 0.0 - 0.1 K/uL   Immature Granulocytes 0 %   Abs Immature Granulocytes 0.01 0.00 - 0.07 K/uL    Comment: Performed at [facility], [address] [place], [place] [address]  Protime-INR     Status: None   Collection Time: 07/13/19 10:20 AM  Result  Value Ref Range   Prothrombin Time 13.1 11.4 - 15.2 seconds   INR 1.0 0.8 - 1.2    Comment: (NOTE) INR goal varies based on device and disease states. Performed at [facility], [address], [place], [place] [address]   Lactate dehydrogenase     Status: Abnormal   Collection Time: 07/13/19 10:20 AM  Result Value Ref Range   LDH 1,280 (H) 98 - 192 U/L    Comment: Performed at [facility], [address] [place], [place] [address]  CK     Status: Abnormal   Collection Time: 07/13/19 10:20 AM  Result Value Ref Range   Total CK 20,712 (H) 38 - 234 U/L    Comment: RESULTS CONFIRMED BY MANUAL DILUTION Performed at [facility], [address], [place], [place] [address]   C-reactive protein     Status: None   Collection Time: 07/13/19 10:20 AM  Result Value Ref Range   CRP <0.5 <1.0 mg/dL    Comment: Performed at [facility], [address] [place] [address] [place], [place] [address]   NCV with EMG(electromyography)  Result Date: 07/12/2019 Glendale Chard, DO     07/12/2019  1:07 PM [facility] Neurology [address] [place], [address]  [place], [place] [address] Tel: [phone] Fax:  [phone] Test Date:  07/12/2019 Patient: Nevayah Faust DOB: [birth year] Physician: Nita Sickle, DO Sex: Female Height: ' 0" Ref Phys: Nita Sickle, DO ID#: [id number] Temp: 32.0C Technician:  Patient Complaints: This is a 59 year old female with hyperCKemia and generalized weakness referred for evaluation of myopathy. NCV & EMG Findings: Extensive electrodiagnostic testing of the left upper and lower extremity shows: 1. Left median and sural  sensory responses are within normal limits. 2. The left median, ulnar, and peroneal motor responses are within normal limits. 3. Diffuse small, complex motor units are seen affecting the pronator teres, brachioradialis, biceps, triceps, deltoid, infraspinatus, gluteus medius, rectus femoris, iliacus, and medial gastrocnemius muscles with evidence of muscle  membrane irritability. 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 ___________________________ Nita Sickle, DO Nerve Conduction Studies Anti Sensory Summary Table  Site NR Peak (ms) Norm Peak (ms) P-T Amp (V) Norm P-T Amp Left Median Anti Sensory (2nd Digit)  32C Wrist    3.3 <3.6 41.3 >15 Left Sural Anti Sensory (Lat Mall)  32C Calf    2.7 <4.6 17.4 >4 Motor Summary Table  Site NR Onset (ms) Norm Onset (ms) O-P Amp (mV) Norm O-P Amp Site1 Site2 Delta-0 (ms) Dist (cm) Vel (m/s) Norm Vel (m/s) Left Median Motor (Abd Poll Brev)  32C Wrist    3.1 <4.0 8.8 >6 Elbow Wrist 4.7 28.0 60 >50 Elbow    7.8  8.8        Left Peroneal Motor (Ext Dig Brev)  32C Ankle    3.8 <6.0 5.2 >2.5 B Fib Ankle 6.8 35.0 51 >40 B Fib    10.6  4.6  Poplt B Fib 1.4 9.0 64 >40 Poplt    12.0  4.2        Left Peroneal TA Motor (Tib Ant)  32C Fib Head    2.5 <4.5 4.0 >3 Poplit Fib Head 1.4 9.0 64 >40 Poplit    3.9  4.0        Left Ulnar Motor (Abd Dig Minimi)  32C Wrist    2.7 <3.1 11.1 >7 B Elbow Wrist 3.9 23.0 59 >50 B Elbow    6.6  10.7  A Elbow B Elbow 2.0 10.0 50 >50 A Elbow    8.6  10.2        EMG  Side Muscle Ins Act Fibs Psw Fasc Number Recrt Dur Dur. Amp Amp. Poly Poly. Comment Right BicepsFemS Nml Nml Nml Nml Nml Nml Nml Nml Nml Nml Nml Nml N/A Left 1stDorInt Nml Nml Nml Nml Nml Nml Nml Nml Nml Nml Nml Nml N/A Left BrachioRad 1+ 1+ Nml Nml Nml Nml Some 1+ Some 1- Some 1+ E.R. Left FlexPolLong Nml Nml Nml Nml Nml Nml Nml Nml Nml Nml Nml Nml N/A Left PronatorTeres Nml Nml Nml Nml Nml Nml Few 1+ Few 1- Few 1+ N/A Left Biceps Nml 2+ Nml Nml Nml Nml Many 1+ Many 1- Many 1+ E.R. Left Triceps Nml 1+ Nml Nml Nml Nml Some 1+ Some 1- Some 1+ E.R. Left Deltoid Nml 1+ Nml Nml Nml Nml Many 1+ Many 1- Many 1+ E.R. Left Cervical Parasp Low Nml 1+ Nml Nml NE Nml - - - - - - N/A Left Infraspinatus 2+ 1+ Nml Nml Nml Nml Most 1+ Most 1- Most 1+ E.R. Left GluteusMed  2+ 1+ Nml Nml Nml Nml All 1+ All 1+ All 1+ E.R. Left AntTibialis Nml Nml Nml Nml Nml Nml Nml Nml Nml Nml Nml Nml N/A Left Gastroc Nml 1+ Nml Nml Nml Nml Few 1+ Few 1- Few 1+ N/A Left RectFemoris 2+ 1+ Nml Nml Nml Nml All 1+ All 1- All 1+ N/A Left Iliacus 1- Nml Nml Nml Nml Nml Most 1+ Most 1+ Most 1+ N/A Waveforms:            Assessment/Plan Autoimmune myositis Elevated CKs/rhabdomyolysis Type  2 diabetes Hypertension Hx radioactive iodine/hypothyroid   Plan: We have been asked to see for muscle biopsy of the right deltoid and right rectus femoralis muscle.  Patient will be admitted by Medicine, we will review the labs in the a.m. for possible muscle biopsy tomorrow.  We will make her n.p.o. after midnight, and follow with you.    Sherrie George [facility] Surgery 07/13/2019, 12:46 PM Please see Amion for pager number during day hours 7:00am-4:30pm

## 2019-07-13 NOTE — ED Provider Notes (Signed)
[facility] EMERGENCY DEPARTMENT Provider Note   CSN: [id number] Arrival date & time: 07/13/19  5537     History Chief Complaint  Patient presents with  . Generalized Body Aches    Andrea Delacruz is a 59 y.o. female.  HPI Patient is referred to the emergency department for admission by Dr. Allena Katz of [facility].  Patient has had severe increasing weakness over the past 4 months.  She had EMG yesterday showing necrotizing myositis and severely elevated CK.  The patient had been on atorvastatin up to a month ago.  At this point, it is very difficult for her to climb stairs.  She requires assistance by her husband to get in and out of the bathtub.  She can drive her vehicle but has to lift her legs into the car.  She has no associated pain.  No chest pain, no shortness of breath.  No abdominal pain.  No nausea no vomiting.    Past Medical History:  Diagnosis Date  . Diabetes mellitus without complication (HCC)   . Hypertension   . Obesity   . Thyroid disease    Hyperactive then was treated with iodine.     Patient Active Problem List   Diagnosis Date Noted  . Palpitations 09/07/2017  . Type 2 diabetes mellitus without complication, without long-term current use of insulin (HCC) 09/07/2017  . Dyslipidemia 09/07/2017  . Hypothyroidism, postradioiodine therapy 06/21/2014  . 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 Obesity, unspecified 10/11/2012  . Essential hypertension 10/11/2012    Past Surgical History:  Procedure Laterality Date  . BREAST REDUCTION SURGERY  [id number]     OB History   No obstetric history on file.     Family History  Problem Relation Age of Onset  . Stroke Mother   . Diabetes Mother   . Diabetes Father   . Diabetes Sister   . Hypertension Sister   . Hypertension Maternal Aunt   . Cancer Maternal Uncle   . Diabetes Paternal Aunt   . Diabetes Paternal Uncle   . Cancer Maternal Grandmother     Social History    Tobacco Use  . Smoking status: Never Smoker  . Smokeless tobacco: Never Used  Substance Use Topics  . Alcohol use: Yes    Comment: Once oer year around holidays  . Drug use: No    Home Medications Prior to Admission medications   Medication Sig Start Date End Date Taking? Authorizing Provider  acetaminophen (TYLENOL) 500 MG tablet Take 500 mg by mouth every 6 (six) hours as needed for mild pain or headache.   Yes [provider]  cholecalciferol (VITAMIN D3) 25 MCG (1000 UNIT) tablet Take 1,000 Units by mouth daily.   Yes [provider]  hydrochlorothiazide (HYDRODIURIL) 25 MG tablet Take 25 mg by mouth daily.  06/28/12  Yes [provider]  lisinopril (PRINIVIL,ZESTRIL) 10 MG tablet Take 10 mg by mouth daily.   Yes [provider]  metFORMIN (GLUCOPHAGE) 500 MG tablet Take 500-1,000 mg by mouth See admin instructions. Taking 2 tabs (1000mg ) in the AM and 1 tab (500mg ) in the evening 04/12/17  Yes [provider]  mirabegron ER (MYRBETRIQ) 50 MG TB24 tablet Take 50 mg by mouth daily.   Yes [provider]  TIROSINT 150 MCG CAPS Take 1 capsule (150 mcg total) by mouth daily before breakfast. 06/23/19  Yes 04/14/17, MD    Allergies    Patient has no known allergies.  Review  of Systems   Review of Systems 10 Systems reviewed and are negative for acute change except as noted in the HPI. Physical Exam Updated Vital Signs BP 120/74   Pulse 70   Temp 98.7 F (37.1 C) (Oral)   Resp 18   SpO2 96%   Physical Exam Constitutional:      Comments: Alert with clear mental status.  No respiratory distress.  Well-nourished well-developed.  HENT:     Head: Normocephalic and atraumatic.  Eyes:     Extraocular Movements: Extraocular movements intact.     Conjunctiva/sclera: Conjunctivae normal.     Pupils: Pupils are equal, round, and reactive to light.  Cardiovascular:     Rate and Rhythm: Normal rate and regular rhythm.   Pulmonary:     Effort: Pulmonary effort is normal.     Breath sounds: Normal breath sounds.  Abdominal:     General: There is no distension.     Palpations: Abdomen is soft.     Tenderness: There is no abdominal tenderness. There is no guarding.  Musculoskeletal:     Comments: 1+ peripheral edema of the lower extremities.  Calves are nontender.  Skin condition good.  Skin:    General: Skin is warm and dry.  Neurological:     Comments: Mental status, cognitive function and speech are normal.  It takes visible effort for the patient to be able to use a handrail to pull her torso forward in the stretcher for auscultation of the lungs.  Patient can only elevate each lower extremity a few inches independently off of the bed.  Her dorsiflexion and plantarflexion of the feet is much stronger.  Appears to have diffuse proximal muscle weakness for routine activities.  Psychiatric:        Mood and Affect: Mood normal.     ED Results / Procedures / Treatments   Labs (all labs ordered are listed, but only abnormal results are displayed) Labs Reviewed  COMPREHENSIVE METABOLIC PANEL - Abnormal; Notable for the following components:      Result Value   Glucose, Bld 112 (*)    BUN 5 (*)    AST 281 (*)    ALT 304 (*)    All other components within normal limits  CBC WITH DIFFERENTIAL/PLATELET - Abnormal; Notable for the following components:   RDW 15.7 (*)    All other components within normal limits  LACTATE DEHYDROGENASE - Abnormal; Notable for the following components:   LDH 1,280 (*)    All other components within normal limits  CK - Abnormal; Notable for the following components:   Total CK 20,712 (*)    All other components within normal limits  PROTIME-INR  C-REACTIVE PROTEIN  URINALYSIS, ROUTINE W REFLEX MICROSCOPIC  SEDIMENTATION RATE    EKG None Muse won't allow read    Radiology NCV with EMG(electromyography)  Result Date: 07/12/2019 Glendale Chard, DO     07/12/2019   1:07 PM [facility] [address] [place], [address]  [place], [place] [address] Tel: [phone] Fax:  [phone] Test Date:  07/12/2019 Patient: Shelvy Heckert DOB: 1960-06-06 Physician: Nita Sickle, DO Sex: Female Height: ' 0" Ref Phys: Nita Sickle, DO ID#: [id number] Temp: 32.0C Technician:  Patient Complaints: This is a 59 year old female with hyperCKemia and generalized weakness referred for evaluation of myopathy. NCV & EMG Findings: Extensive electrodiagnostic testing of the left upper and lower extremity shows: 1. Left median and sural sensory responses are within normal limits. 2. The left  median, ulnar, and peroneal motor responses are within normal limits. 3. Diffuse small, complex motor units are seen affecting the pronator teres, brachioradialis, biceps, triceps, deltoid, infraspinatus, gluteus medius, rectus femoris, iliacus, and medial gastrocnemius muscles with evidence of muscle membrane irritability. 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 ___________________________ Nita Sickle, DO Nerve Conduction Studies Anti Sensory Summary Table  Site NR Peak (ms) Norm Peak (ms) P-T Amp (V) Norm P-T Amp Left Median Anti Sensory (2nd Digit)  32C Wrist    3.3 <3.6 41.3 >15 Left Sural Anti Sensory (Lat Mall)  32C Calf    2.7 <4.6 17.4 >4 Motor Summary Table  Site NR Onset (ms) Norm Onset (ms) O-P Amp (mV) Norm O-P Amp Site1 Site2 Delta-0 (ms) Dist (cm) Vel (m/s) Norm Vel (m/s) Left Median Motor (Abd Poll Brev)  32C Wrist    3.1 <4.0 8.8 >6 Elbow Wrist 4.7 28.0 60 >50 Elbow    7.8  8.8        Left Peroneal Motor (Ext Dig Brev)  32C Ankle    3.8 <6.0 5.2 >2.5 B Fib Ankle 6.8 35.0 51 >40 B Fib    10.6  4.6  Poplt B Fib 1.4 9.0 64 >40 Poplt    12.0  4.2        Left Peroneal TA Motor (Tib Ant)  32C Fib Head    2.5 <4.5 4.0 >3 Poplit Fib Head 1.4 9.0 64 >40 Poplit    3.9  4.0        Left Ulnar Motor (Abd Dig  Minimi)  32C Wrist    2.7 <3.1 11.1 >7 B Elbow Wrist 3.9 23.0 59 >50 B Elbow    6.6  10.7  A Elbow B Elbow 2.0 10.0 50 >50 A Elbow    8.6  10.2        EMG  Side Muscle Ins Act Fibs Psw Fasc Number Recrt Dur Dur. Amp Amp. Poly Poly. Comment Right BicepsFemS Nml Nml Nml Nml Nml Nml Nml Nml Nml Nml Nml Nml N/A Left 1stDorInt Nml Nml Nml Nml Nml Nml Nml Nml Nml Nml Nml Nml N/A Left BrachioRad 1+ 1+ Nml Nml Nml Nml Some 1+ Some 1- Some 1+ E.R. Left FlexPolLong Nml Nml Nml Nml Nml Nml Nml Nml Nml Nml Nml Nml N/A Left PronatorTeres Nml Nml Nml Nml Nml Nml Few 1+ Few 1- Few 1+ N/A Left Biceps Nml 2+ Nml Nml Nml Nml Many 1+ Many 1- Many 1+ E.R. Left Triceps Nml 1+ Nml Nml Nml Nml Some 1+ Some 1- Some 1+ E.R. Left Deltoid Nml 1+ Nml Nml Nml Nml Many 1+ Many 1- Many 1+ E.R. Left Cervical Parasp Low Nml 1+ Nml Nml NE Nml - - - - - - N/A Left Infraspinatus 2+ 1+ Nml Nml Nml Nml Most 1+ Most 1- Most 1+ E.R. Left GluteusMed 2+ 1+ Nml Nml Nml Nml All 1+ All 1+ All 1+ E.R. Left AntTibialis Nml Nml Nml Nml Nml Nml Nml Nml Nml Nml Nml Nml N/A Left Gastroc Nml 1+ Nml Nml Nml Nml Few 1+ Few 1- Few 1+ N/A Left RectFemoris 2+ 1+ Nml Nml Nml Nml All 1+ All 1- All 1+ N/A Left Iliacus 1- Nml Nml Nml Nml Nml Most 1+ Most 1+ Most 1+ N/A Waveforms:          Procedures Procedures (including critical care time)  Medications Ordered in ED Medications - No data to display  ED Course  I have reviewed the triage vital signs and the nursing notes.  Pertinent labs & imaging results that were available during my care of the patient were reviewed by me and considered in my medical decision making (see chart for details).    MDM Rules/Calculators/A&P                      Consult: [facility] hospitalist Dr. Roosevelt Locks consulted for admission. Consult: Dr. Rory Percy neurology will see the patient.  Patient presents with a known myositis.  This has been progressive and worsening.  She is becoming weak to perform ADLs.  Patient's mental status is  clear.  She does not have pain.  She does not have fever, chills or headache.  Patient will be admitted for further diagnostic evaluation.  Final Clinical Impression(s) / ED Diagnoses Final diagnoses:  Myositis of multiple sites, unspecified myositis type    Rx / DC Orders ED Discharge Orders    None       Charlesetta Shanks, MD 07/13/19 1215

## 2019-07-13 NOTE — Consult Note (Signed)
Neurology Consultation  Reason for Consult: Admit for hydration and muscle biopsy with diagnosis of myositis Referring Physician: Charlesetta Shanks  CC: Elevated CK/proximal weakness  History is obtained from: Patient  HPI: Andrea Delacruz is a 59 y.o. female with history of thyroid disease, obesity, hypertension and diabetes.  Patient states that she has had proximal weakness of bilateral shoulders and bilateral hips that has been progressing for the last 4 months.  She is followed by Dr. Thresa Ross neurology.  Dr. Posey Pronto sent to the ED to be further evaluated by neurology, muscle biopsy and significant hydration due to elevated CK and concern for myositis.  Per note from Dr. Posey Pronto.  Patient has started to have difficulty climbing up stairs since around November 2020.  This gradually progressed to where at this point she has to manually lift her legs to place them in the car.  And additionally she has difficulty and weakness raising her arms.  She denies any pain, cramps, dark-colored urine.  She does take Lipitor however she has been taking this for the past 3 years.  She did try physical therapy which did not help.  She had an extensive evaluation which included neuro imaging of the neural axis, which showed cervical spondylosis and spinal stenosis at C3-4-5 through C6-7, no cord compression.  She has degenerative changes in the lumbar spine at L5 nerve root, but this would not explain her hip flexion weakness.  Patient was seen on 07/12/2019.  Per note her labs showed markedly elevated CK of 19,000 with normal inflammatory markers.  At this time she still continues not to have dark-colored urine, but reports reduced urine output.  Patient renal function has remained normal.  Her LFTs are elevated and likely this is due to elevated CK.  Dr. Posey Pronto is sent out myositis antibody panel which is still pending.  Due to progressive and constant hip flexor weakness and proximal shoulder weakness patient was  sent to [facility] for hydration and muscle biopsy.  Currently patient describes the same as above.  Past Medical History:  Diagnosis Date  . Diabetes mellitus without complication ([place])   . Hypertension   . Obesity   . Thyroid disease    Hyperactive then was treated with iodine.     Family History  Problem Relation Age of Onset  . Stroke Mother   . Diabetes Mother   . Diabetes Father   . Diabetes Sister   . Hypertension Sister   . Hypertension Maternal Aunt   . Cancer Maternal Uncle   . Diabetes Paternal Aunt   . Diabetes Paternal Uncle   . Cancer Maternal Grandmother    Social History:   reports that she has never smoked. She has never used smokeless tobacco. She reports current alcohol use. She reports that she does not use drugs.  Medications No current facility-administered medications for this encounter.  Current Outpatient Medications:  .  acetaminophen (TYLENOL) 500 MG tablet, Take 500 mg by mouth every 6 (six) hours as needed for mild pain or headache., Disp: , Rfl:  .  cholecalciferol (VITAMIN D3) 25 MCG (1000 UNIT) tablet, Take 1,000 Units by mouth daily., Disp: , Rfl:  .  hydrochlorothiazide (HYDRODIURIL) 25 MG tablet, Take 25 mg by mouth daily. , Disp: , Rfl:  .  lisinopril (PRINIVIL,ZESTRIL) 10 MG tablet, Take 10 mg by mouth daily., Disp: , Rfl:  .  metFORMIN (GLUCOPHAGE) 500 MG tablet, Take 500-1,000 mg by mouth See admin instructions. Taking 2 tabs (1000mg ) in  the AM and 1 tab (500mg ) in the evening, Disp: , Rfl:  .  mirabegron ER (MYRBETRIQ) 50 MG TB24 tablet, Take 50 mg by mouth daily., Disp: , Rfl:  .  TIROSINT 150 MCG CAPS, Take 1 capsule (150 mcg total) by mouth daily before breakfast., Disp: 30 capsule, Rfl: 2  ROS:    General ROS: negative for - chills, fatigue, fever, night sweats, weight gain or weight loss Psychological ROS: negative for - behavioral disorder, hallucinations, memory difficulties, mood swings or suicidal ideation Ophthalmic ROS:  negative for - blurry vision, double vision, eye pain or loss of vision ENT ROS: negative for - epistaxis, nasal discharge, oral lesions, sore throat, tinnitus or vertigo Allergy and Immunology ROS: negative for - hives or itchy/watery eyes Hematological and Lymphatic ROS: negative for - bleeding problems, bruising or swollen lymph nodes Endocrine ROS: negative for - galactorrhea, hair pattern changes, polydipsia/polyuria or temperature intolerance Respiratory ROS: negative for - cough, hemoptysis, shortness of breath or wheezing Cardiovascular ROS: negative for - chest pain, dyspnea on exertion, edema or irregular heartbeat Gastrointestinal ROS: negative for - abdominal pain, diarrhea, hematemesis, nausea/vomiting or stool incontinence Genito-Urinary ROS: negative for - dysuria, hematuria, incontinence or urinary frequency/urgency Musculoskeletal ROS: Positive for -  muscular weakness Neurological ROS: as noted in HPI Dermatological ROS: negative for rash and skin lesion changes  Exam: Current vital signs: BP 120/74   Pulse 70   Temp 98.7 F (37.1 C) (Oral)   Resp 18   SpO2 96%  Vital signs in last 24 hours: Temp:  [98.7 F (37.1 C)] 98.7 F (37.1 C) (02/24 0914) Pulse Rate:  [70-86] 70 (02/24 0945) Resp:  [15-18] 18 (02/24 0945) BP: (114-137)/(67-84) 120/74 (02/24 0945) SpO2:  [96 %-98 %] 96 % (02/24 0945) Constitutional: Appears well-developed and well-nourished.  Psych: Affect appropriate to situation Eyes: No scleral injection HENT: No OP obstrucion Head: Normocephalic.  Cardiovascular: Normal rate and regular rhythm.  Respiratory: Effort normal, non-labored breathing GI: Soft.  No distension. There is no tenderness.  Skin: WDI Neuro:  Mental Status: Alert, oriented, thought content appropriate.  Speech fluent without evidence of aphasia.  Able to follow 3 step commands without difficulty. Cranial Nerves: II: visual fields grossly normal, pupils equal, round, reactive  to light and accommodation III,IV, VI: ptosis not present, extraocular muscles extra-ocular motions intact bilaterally V,VII: smile symmetric, facial light touch sensation normal bilaterally VIII: hearing normal bilaterally IX,X: gag reflex present XI: trapezius strength/neck flexion strength normal bilaterally XII: tongue strength normal  Motor: Right : Upper extremity    Left:     Upper extremity 4/5 deltoid       4/5 deltoid 5/5 tricep      5/5 tricep 5/5 biceps      5/5 biceps  5/5wrist flexion     5/5 wrist flexion 5/5 wrist extension     5/5 wrist extension 5/5 hand grip      5/5 hand grip  Lower extremity     Lower extremity 3/5 hip flexor      3/5 hip flexor 5/5 hip adductors     5/5 hip adductors 5/5 hip abductors     5/5 hip abductors 5/5 quadricep      5/5 quadriceps  4/5 hamstrings     4/5 hamstrings 5/5 plantar flexion       5/5 plantar flexion 5/5 plantar extension     5/5 plantar extension Tone and bulk:normal tone throughout; no atrophy noted Sensory: Pinprick and light touch intact throughout,  bilaterally Deep Tendon Reflexes:  Right: Upper Extremity   Left: Upper extremity   biceps (C-5 to C-6) 2/4   biceps (C-5 to C-6) 2/4 tricep (C7) 2/4    triceps (C7) 2/4 Brachioradialis (C6) 2/4  Brachioradialis (C6) 2/4  Lower Extremity Lower Extremity  quadriceps (L-2 to L-4) 2/4   quadriceps (L-2 to L-4) 2/4 Achilles (S1) 1/4   Achilles (S1) 1/4 Cerebellar: normal finger-to-nose Gait: Patient has a cautious gait with small steps slightly wide-based  Labs I have reviewed labs in epic and the results pertinent to this consultation are:  CBC    Component Value Date/Time   WBC 4.8 07/13/2019 1020   RBC 4.23 07/13/2019 1020   HGB 12.0 07/13/2019 1020   HCT 38.6 07/13/2019 1020   PLT 346 07/13/2019 1020   MCV 91.3 07/13/2019 1020   MCH 28.4 07/13/2019 1020   MCHC 31.1 07/13/2019 1020   RDW 15.7 (H) 07/13/2019 1020   LYMPHSABS 2.0 07/13/2019 1020   MONOABS  0.4 07/13/2019 1020   EOSABS 0.1 07/13/2019 1020   BASOSABS 0.1 07/13/2019 1020    CMP     Component Value Date/Time   NA 138 07/13/2019 1020   NA 138 07/08/2019 1355   K 3.9 07/13/2019 1020   CL 101 07/13/2019 1020   CO2 29 07/13/2019 1020   GLUCOSE 112 (H) 07/13/2019 1020   BUN 5 (L) 07/13/2019 1020   BUN 6 07/08/2019 1355   CREATININE 0.58 07/13/2019 1020   CALCIUM 9.8 07/13/2019 1020   PROT 6.6 07/13/2019 1020   PROT 7.2 07/08/2019 1355   ALBUMIN 3.9 07/13/2019 1020   ALBUMIN 4.5 07/08/2019 1355   AST 281 (H) 07/13/2019 1020   ALT 304 (H) 07/13/2019 1020   ALKPHOS 66 07/13/2019 1020   BILITOT 0.6 07/13/2019 1020   BILITOT 0.5 07/08/2019 1355   GFRNONAA >60 07/13/2019 1020   GFRAA >60 07/13/2019 1020   Diagnostic testing NCS/EMG of the left arm and leg 07/12/2019:  The electrophysiologic findings are 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   Felicie Morn PA-C [facility] [phone]  M-F  (9:00 am- 5:00 PM)  07/13/2019, 12:20 PM   Assessment:  This is a 59 year old female sent to hospital by Dr. Allena Katz due to proximal myopathy manifesting with progressive weakness and nontraumatic rhabdomyolysis.  CK 1900+, normal renal function.  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   There is a very high clinical suspicion for autoimmune myositis, such as anti-- HMGCR myopathy.   Dr. Allena Katz has DC'd patient's Lipitor, myositis panel is pending, and Dr. Allena Katz has sent out myopathy to panel to [facility] neuromuscular lab, which includes HMGCR antibody High risk for renal function deterioration/rhabdo with the hyperCKemia, hence sent in for hospital admission and medical management as well as expedited muscle biopsy (treatment if tissue diagnosis is made would be steroids and she is a diabetic so need to be essentially certain of the diagnosis prior to initiating  steroids)  Impression: -Autoimmune myositis -Elevated CK, rhabdomyolysis  Recommendations: -Generous IV hydration -Monitor daily labs BMP, CK -Management of diabetes, hypertension per primary team. -Expedited muscle biopsy of the right deltoid and right rectus femoris muscle-consulted general surgery-awaiting response from attending physician for discussion.   Attending Neurohospitalist Addendum Patient seen and examined with APP/Resident. Agree with the history and physical as documented above. Agree with the plan as documented, which I helped formulate. I have independently reviewed the chart, obtained history,  review of systems and examined the patient.I have personally reviewed pertinent head/neck/spine imaging (CT/MRI). Please feel free to call with any questions. --- Milon Dikes, MD [facility] Pager: [phone] If 7pm to 7am, please call on call as listed on AMION.

## 2019-07-13 NOTE — ED Notes (Signed)
Walked patient to the bathroom patient did well 

## 2019-07-14 DIAGNOSIS — M6282 Rhabdomyolysis: Secondary | ICD-10-CM

## 2019-07-14 LAB — CK: Total CK: 19782 U/L — ABNORMAL HIGH (ref 29–143)

## 2019-07-14 LAB — CBC
HCT: 36 % (ref 36.0–46.0)
Hemoglobin: 11.4 g/dL — ABNORMAL LOW (ref 12.0–15.0)
MCH: 28.4 pg (ref 26.0–34.0)
MCHC: 31.7 g/dL (ref 30.0–36.0)
MCV: 89.8 fL (ref 80.0–100.0)
Platelets: 333 10*3/uL (ref 150–400)
RBC: 4.01 MIL/uL (ref 3.87–5.11)
RDW: 15.6 % — ABNORMAL HIGH (ref 11.5–15.5)
WBC: 5.3 10*3/uL (ref 4.0–10.5)
nRBC: 0 % (ref 0.0–0.2)

## 2019-07-14 LAB — MYOSITIS ASSESSR PLUS JO-1 AUTOABS
EJ Autoabs: NOT DETECTED
Jo-1 Autoabs: <1 AI
Ku Autoabs: NOT DETECTED
Mi-2 Autoabs: NOT DETECTED
OJ Autoabs: NOT DETECTED
PL-12 Autoabs: NOT DETECTED
PL-7 Autoabs: NOT DETECTED
SRP Autoabs: NOT DETECTED

## 2019-07-14 LAB — COMPREHENSIVE METABOLIC PANEL
ALT: 273 U/L — ABNORMAL HIGH (ref 0–44)
AST: 246 U/L — ABNORMAL HIGH (ref 15–41)
Albumin: 3.6 g/dL (ref 3.5–5.0)
Alkaline Phosphatase: 56 U/L (ref 38–126)
Anion gap: 11 (ref 5–15)
BUN: 7 mg/dL (ref 6–20)
CO2: 32 mmol/L (ref 22–32)
Calcium: 9.6 mg/dL (ref 8.9–10.3)
Chloride: 97 mmol/L — ABNORMAL LOW (ref 98–111)
Creatinine, Ser: 0.67 mg/dL (ref 0.44–1.00)
GFR calc Af Amer: >60 mL/min (ref >60)
GFR calc non Af Amer: >60 mL/min (ref >60)
Glucose, Bld: 141 mg/dL — ABNORMAL HIGH (ref 70–99)
Potassium: 3.3 mmol/L — ABNORMAL LOW (ref 3.5–5.1)
Sodium: 140 mmol/L (ref 135–145)
Total Bilirubin: 1 mg/dL (ref 0.3–1.2)
Total Protein: 6.3 g/dL — ABNORMAL LOW (ref 6.5–8.1)

## 2019-07-14 LAB — GLUCOSE, CAPILLARY
Glucose-Capillary: 58 mg/dL — ABNORMAL LOW (ref 70–99)
Glucose-Capillary: 61 mg/dL — ABNORMAL LOW (ref 70–99)
Glucose-Capillary: 81 mg/dL (ref 70–99)
Glucose-Capillary: 91 mg/dL (ref 70–99)

## 2019-07-14 LAB — MYASTHENIA GRAVIS PANEL 2
A CHR BINDING ABS: <0.3 nmol/L
ACHR Blocking Abs: <15 % Inhibition (ref <15)
Acetylchol Modul Ab: 19 % Inhibition

## 2019-07-14 LAB — CK TOTAL AND CKMB (NOT AT ARMC)
CK, MB: 288.2 ng/mL — ABNORMAL HIGH (ref 0.5–5.0)
Relative Index: 2.3 (ref 0.0–2.5)
Total CK: 12548 U/L — ABNORMAL HIGH (ref 38–234)

## 2019-07-14 LAB — HEMOGLOBIN A1C
Hgb A1c MFr Bld: 7.2 % — ABNORMAL HIGH (ref 4.8–5.6)
Mean Plasma Glucose: 159.94 mg/dL

## 2019-07-14 LAB — ALDOLASE: Aldolase: 117 U/L — ABNORMAL HIGH (ref <=8.1)

## 2019-07-14 LAB — C-REACTIVE PROTEIN: CRP: 2.7 mg/L (ref <8.0)

## 2019-07-14 MED ORDER — POTASSIUM CHLORIDE CRYS ER 20 MEQ PO TBCR
40.0000 meq | EXTENDED_RELEASE_TABLET | Freq: Once | ORAL | Status: AC
  Administered 2019-07-14: 16:00:00 40 meq via ORAL
  Filled 2019-07-14: qty 2

## 2019-07-14 MED ORDER — INSULIN ASPART 100 UNIT/ML ~~LOC~~ SOLN
0.0000 [IU] | Freq: Three times a day (TID) | SUBCUTANEOUS | Status: DC
  Administered 2019-07-15 (×2): 2 [IU] via SUBCUTANEOUS

## 2019-07-14 MED ORDER — CEFAZOLIN SODIUM-DEXTROSE 2-4 GM/100ML-% IV SOLN
2.0000 g | INTRAVENOUS | Status: AC
  Administered 2019-07-15: 08:00:00 2 g via INTRAVENOUS
  Filled 2019-07-14: qty 100

## 2019-07-14 NOTE — Progress Notes (Signed)
PROGRESS NOTE                                                                                                                                                                                                             Patient Demographics:    Andrea Delacruz, is a 59 y.o. female, DOB - 15-May-1961, TMH:[id number]  Admit date - 07/13/2019   Admitting Physician Emeline General, MD  Outpatient Primary MD for the patient is Juluis Rainier, MD  LOS - 1   Chief Complaint  Patient presents with  . Generalized Body Aches       Brief Narrative    59 y.o. female with medical history significant of thyroid disease s/p radioactive ablation more than 30 years ago, obesity, hypertension and IIDM presented with increasing bilateral leg weakness of bilateral shoulders weakness for 49-months. She is followed by Dr. Allena Katz [facility]. PCP Dr. Allena Katz sent patient to ED to be further evaluated by neurology, muscle biopsy and significant hydration due to elevated CK and concern for myositis.  Started to feeling difficulty climbing up stairs since around November 2020, and gradually getting worse, now has to pull up legs to climb stairs, also much difficulty raising her arms above her shoulders. She denies any pain, cramps, dark-colored urine.  She said her symptoms were more severe in the late part of the day, denied any short of breath, no swallowing problems, no constipation.  Denied any double vision or blurry vision.  Denied any rash.    Subjective:    Andrea Delacruz today reports weakness in her hips and shoulders, but denies any pain .   Assessment  & Plan :    Active Problems:   Myositis   Rhabdomyolysis   Proximal myopathy/nontraumatic rhabdomyolysis  -High clinical suspicion for myositis, with significantly elevated total CK on presentation, . -Plan for muscle biopsy by general surgery tomorrow . -On steroids pending biopsy . - rhabdomyolysis  secondary to likely myositis -Function stable, but total CK extremely elevated at 20,000, for now continue with bicarb drip. -Neurology input greatly appreciated.  IIDM Hold Metformin, will keep on sliding scale during hospital stay  HTN Continue home meds.  COVID-19 Labs  Recent Labs    07/13/19 1020  LDH 1,280*  CRP <0.5    Lab Results  Component Value Date   SARSCOV2NAA NEGATIVE 07/13/2019  Code Status : Full  Family Communication  : none at bedside  Disposition Plan  :Home  Barriers For Discharge : Is on IV fluids, plan for muscle biopsy tomorrow  Consults  :  General Surgery, Neurology  Procedures  : None  DVT Prophylaxis  : SCD  Lab Results  Component Value Date   PLT 333 07/14/2019    Antibiotics  :    Anti-infectives (From admission, onward)   Start     Dose/Rate Route Frequency Ordered Stop   07/15/19 0600  ceFAZolin (ANCEF) IVPB 2g/100 mL premix     2 g 200 mL/hr over 30 Minutes Intravenous On call to O.R. 07/14/19 1507 07/16/19 0559   07/14/19 0600  ceFAZolin (ANCEF) IVPB 2g/100 mL premix  Status:  Discontinued     2 g 200 mL/hr over 30 Minutes Intravenous On call to O.R. 07/13/19 1600 07/14/19 1506        Objective:   Vitals:   07/13/19 2331 07/14/19 0524 07/14/19 0832 07/14/19 1138  BP: 101/66 103/64 110/76 109/62  Pulse: 81 82  86  Resp: 17 18  17   Temp: 97.9 F (36.6 C) 98 F (36.7 C)  98.5 F (36.9 C)  TempSrc: Oral Oral  Oral  SpO2: 96% 97%  98%    Wt Readings from Last 3 Encounters:  07/12/19 99.2 kg  06/27/19 98.8 kg  05/20/18 91.1 kg     Intake/Output Summary (Last 24 hours) at 07/14/2019 1543 Last data filed at 07/14/2019 1500 Gross per 24 hour  Intake 2553.66 ml  Output -  Net 2553.66 ml     Physical Exam  Awake Alert, Oriented X 3, No new F.N deficits, Normal affect Symmetrical Chest wall movement, Good air movement bilaterally, CTAB RRR,No Gallops,Rubs or new Murmurs, No Parasternal Heave +ve  B.Sounds, Abd Soft, No tenderness, No organomegaly appriciated, No rebound - guarding or rigidity. No Cyanosis, Clubbing or edema, No new Rash or bruise      Data Review:    CBC Recent Labs  Lab 07/13/19 1020 07/14/19 0529  WBC 4.8 5.3  HGB 12.0 11.4*  HCT 38.6 36.0  PLT 346 333  MCV 91.3 89.8  MCH 28.4 28.4  MCHC 31.1 31.7  RDW 15.7* 15.6*  LYMPHSABS 2.0  --   MONOABS 0.4  --   EOSABS 0.1  --   BASOSABS 0.1  --     Chemistries  Recent Labs  Lab 07/08/19 1355 07/13/19 1020 07/14/19 0529  NA 138 138 140  K 4.6 3.9 3.3*  CL 98 101 97*  CO2 26 29 32  GLUCOSE 71 112* 141*  BUN 6 5* 7  CREATININE 0.58 0.58 0.67  CALCIUM 10.4* 9.8 9.6  AST 411* 281* 246*  ALT 434* 304* 273*  ALKPHOS 101 66 56  BILITOT 0.5 0.6 1.0   ------------------------------------------------------------------------------------------------------------------ No results for input(s): CHOL, HDL, LDLCALC, TRIG, CHOLHDL, LDLDIRECT in the last 72 hours.  Lab Results  Component Value Date   HGBA1C 7.3 (H) 11/08/2018   ------------------------------------------------------------------------------------------------------------------ No results for input(s): TSH, T4TOTAL, T3FREE, THYROIDAB in the last 72 hours.  Invalid input(s): FREET3 ------------------------------------------------------------------------------------------------------------------ No results for input(s): VITAMINB12, FOLATE, FERRITIN, TIBC, IRON, RETICCTPCT in the last 72 hours.  Coagulation profile Recent Labs  Lab 07/13/19 1020  INR 1.0    No results for input(s): DDIMER in the last 72 hours.  Cardiac Enzymes Recent Labs  Lab 07/14/19 0529  CKMB 288.2*   ------------------------------------------------------------------------------------------------------------------ No results found for: BNP  Inpatient Medications  Scheduled Meds: . cholecalciferol  1,000 Units Oral Daily  . hydrochlorothiazide  25 mg Oral  Daily  . insulin aspart  0-9 Units Subcutaneous TID WC  . levothyroxine  150 mcg Oral Q0600  . lisinopril  10 mg Oral Daily  . mirabegron ER  50 mg Oral Daily   Continuous Infusions: . [START ON 07/15/2019]  ceFAZolin (ANCEF) IV    .  sodium bicarbonate (isotonic) infusion in sterile water Stopped (07/14/19 1457)   PRN Meds:.acetaminophen, senna-docusate  Micro Results Recent Results (from the past 240 hour(s))  Respiratory Panel by RT PCR (Flu A&B, Covid) - Nasopharyngeal Swab     Status: None   Collection Time: 07/13/19  1:20 PM   Specimen: Nasopharyngeal Swab  Result Value Ref Range Status   SARS Coronavirus 2 by RT PCR NEGATIVE NEGATIVE Final    Comment: (NOTE) SARS-CoV-2 target nucleic acids are NOT DETECTED. The SARS-CoV-2 RNA is generally detectable in upper respiratoy specimens during the acute phase of infection. The lowest concentration of SARS-CoV-2 viral copies this assay can detect is 131 copies/mL. A negative result does not preclude SARS-Cov-2 infection and should not be used as the sole basis for treatment or other patient management decisions. A negative result may occur with  improper specimen collection/handling, submission of specimen other than nasopharyngeal swab, presence of viral mutation(s) within the areas targeted by this assay, and inadequate number of viral copies (<131 copies/mL). A negative result must be combined with clinical observations, patient history, and epidemiological information. The expected result is Negative. Fact Sheet for Patients:  https://www.moore.com/ Fact Sheet for Healthcare Providers:  https://www.young.biz/ This test is not yet ap proved or cleared by the [place] FDA and  has been authorized for detection and/or diagnosis of SARS-CoV-2 by FDA under an Emergency Use Authorization (EUA). This EUA will remain  in effect (meaning this test can be used) for the duration of the  COVID-19 declaration under Section 564(b)(1) of the Act, 21 U.S.C. section 360bbb-3(b)(1), unless the authorization is terminated or revoked sooner.    Influenza A by PCR NEGATIVE NEGATIVE Final   Influenza B by PCR NEGATIVE NEGATIVE Final    Comment: (NOTE) The Xpert Xpress SARS-CoV-2/FLU/RSV assay is intended as an aid in  the diagnosis of influenza from Nasopharyngeal swab specimens and  should not be used as a sole basis for treatment. Nasal washings and  aspirates are unacceptable for Xpert Xpress SARS-CoV-2/FLU/RSV  testing. Fact Sheet for Patients: https://www.moore.com/ Fact Sheet for Healthcare Providers: https://www.young.biz/ This test is not yet approved or cleared by the [place] FDA and  has been authorized for detection and/or diagnosis of SARS-CoV-2 by  FDA under an Emergency Use Authorization (EUA). This EUA will remain  in effect (meaning this test can be used) for the duration of the  Covid-19 declaration under Section 564(b)(1) of the Act, 21  U.S.C. section 360bbb-3(b)(1), unless the authorization is  terminated or revoked. Performed at [facility], [address] [place], [place] [address]     Radiology Reports NCV with EMG(electromyography)  Result Date: 07/12/2019 Glendale Chard, DO     07/12/2019  1:07 PM [facility] [address] [place], [address]  [place], [place] [address] Tel: [phone] Fax:  [phone] Test Date:  07/12/2019 Patient: Darolyn Double DOB: March 11, 1961 Physician: Nita Sickle, DO Sex: Female Height: ' 0" Ref Phys: Nita Sickle, DO ID#: [id number] Temp: 32.0C Technician:  Patient Complaints: This is a 59 year old female with hyperCKemia and generalized weakness  referred for evaluation of myopathy. NCV & EMG Findings: Extensive electrodiagnostic testing of the left upper and lower extremity shows: 1. Left median and sural sensory responses are within normal limits. 2. The left median,  ulnar, and peroneal motor responses are within normal limits. 3. Diffuse small, complex motor units are seen affecting the pronator teres, brachioradialis, biceps, triceps, deltoid, infraspinatus, gluteus medius, rectus femoris, iliacus, and medial gastrocnemius muscles with evidence of muscle membrane irritability. 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 ___________________________ Nita Sickle, DO Nerve Conduction Studies Anti Sensory Summary Table  Site NR Peak (ms) Norm Peak (ms) P-T Amp (V) Norm P-T Amp Left Median Anti Sensory (2nd Digit)  32C Wrist    3.3 <3.6 41.3 >15 Left Sural Anti Sensory (Lat Mall)  32C Calf    2.7 <4.6 17.4 >4 Motor Summary Table  Site NR Onset (ms) Norm Onset (ms) O-P Amp (mV) Norm O-P Amp Site1 Site2 Delta-0 (ms) Dist (cm) Vel (m/s) Norm Vel (m/s) Left Median Motor (Abd Poll Brev)  32C Wrist    3.1 <4.0 8.8 >6 Elbow Wrist 4.7 28.0 60 >50 Elbow    7.8  8.8        Left Peroneal Motor (Ext Dig Brev)  32C Ankle    3.8 <6.0 5.2 >2.5 B Fib Ankle 6.8 35.0 51 >40 B Fib    10.6  4.6  Poplt B Fib 1.4 9.0 64 >40 Poplt    12.0  4.2        Left Peroneal TA Motor (Tib Ant)  32C Fib Head    2.5 <4.5 4.0 >3 Poplit Fib Head 1.4 9.0 64 >40 Poplit    3.9  4.0        Left Ulnar Motor (Abd Dig Minimi)  32C Wrist    2.7 <3.1 11.1 >7 B Elbow Wrist 3.9 23.0 59 >50 B Elbow    6.6  10.7  A Elbow B Elbow 2.0 10.0 50 >50 A Elbow    8.6  10.2        EMG  Side Muscle Ins Act Fibs Psw Fasc Number Recrt Dur Dur. Amp Amp. Poly Poly. Comment Right BicepsFemS Nml Nml Nml Nml Nml Nml Nml Nml Nml Nml Nml Nml N/A Left 1stDorInt Nml Nml Nml Nml Nml Nml Nml Nml Nml Nml Nml Nml N/A Left BrachioRad 1+ 1+ Nml Nml Nml Nml Some 1+ Some 1- Some 1+ E.R. Left FlexPolLong Nml Nml Nml Nml Nml Nml Nml Nml Nml Nml Nml Nml N/A Left PronatorTeres Nml Nml Nml Nml Nml Nml Few 1+ Few 1- Few 1+ N/A Left Biceps Nml 2+ Nml Nml Nml Nml Many 1+ Many  1- Many 1+ E.R. Left Triceps Nml 1+ Nml Nml Nml Nml Some 1+ Some 1- Some 1+ E.R. Left Deltoid Nml 1+ Nml Nml Nml Nml Many 1+ Many 1- Many 1+ E.R. Left Cervical Parasp Low Nml 1+ Nml Nml NE Nml - - - - - - N/A Left Infraspinatus 2+ 1+ Nml Nml Nml Nml Most 1+ Most 1- Most 1+ E.R. Left GluteusMed 2+ 1+ Nml Nml Nml Nml All 1+ All 1+ All 1+ E.R. Left AntTibialis Nml Nml Nml Nml Nml Nml Nml Nml Nml Nml Nml Nml N/A Left Gastroc Nml 1+ Nml Nml Nml Nml Few 1+ Few 1- Few 1+ N/A Left RectFemoris 2+ 1+ Nml Nml Nml Nml All 1+ All 1- All 1+ N/A Left Iliacus 1- Nml Nml Nml Nml Nml  Most 1+ Most 1+ Most 1+ N/A Waveforms:            Huey Bienenstock M.D on 07/14/2019 at 3:43 PM  Between 7am to 7pm - Pager - [phone]  After 7pm go to [email or web address] - password [facility]  [facility] -  Office  [phone]

## 2019-07-14 NOTE — Progress Notes (Signed)
Neurology Progress Note   S:// Seen and examined. No changes overnight   O:// Current vital signs: BP 110/76   Pulse 82   Temp 98 F (36.7 C) (Oral)   Resp 18   SpO2 97%  Vital signs in last 24 hours: Temp:  [97.9 F (36.6 C)-98.7 F (37.1 C)] 98 F (36.7 C) (02/25 0524) Pulse Rate:  [66-86] 82 (02/25 0524) Resp:  [11-19] 18 (02/25 0524) BP: (101-137)/(64-85) 110/76 (02/25 0832) SpO2:  [96 %-99 %] 97 % (02/25 0524) Exam unchanged Neuro:  Mental Status: Alert, oriented, thought content appropriate.  Speech fluent without evidence of aphasia.  Able to follow 3 step commands without difficulty. Cranial Nerves: II: visual fields grossly normal, pupils equal, round, reactive to light and accommodation III,IV, VI: ptosis not present, extraocular muscles extra-ocular motions intact bilaterally V,VII: smile symmetric, facial light touch sensation normal bilaterally VIII: hearing normal bilaterally IX,X: gag reflex present XI: trapezius strength/neck flexion strength normal bilaterally XII: tongue strength normal  Motor: Right :  Upper extremity                                              Left:     Upper extremity 4/5 deltoid                                                                   4/5 deltoid 5/5 tricep                                                                     5/5 tricep 5/5 biceps                                                                    5/5 biceps  5/5wrist flexion                                                            5/5 wrist flexion 5/5 wrist extension                                                      5/5 wrist extension 5/5 hand grip  5/5 hand grip             Lower extremity                                                          Lower extremity 3/5 hip flexor                                                               3/5 hip flexor 5/5 hip adductors                                                         5/5 hip adductors 5/5 hip abductors                                                        5/5 hip abductors 5/5 quadricep                                                              5/5 quadriceps  4/5 hamstrings                                                            4/5 hamstrings 5/5 plantar flexion                                                       5/5 plantar flexion 5/5 plantar extension                                                  5/5 plantar extension Tone and bulk:normal tone throughout; no atrophy noted Sensory: Pinprick and light touch intact throughout, bilaterally Deep Tendon Reflexes:  Right: Upper Extremity                       Left: Upper extremity   biceps (C-5 to C-6) 2/4                       biceps (C-5 to C-6) 2/4 tricep (C7) 2/4  triceps (C7) 2/4 Brachioradialis (C6) 2/4                      Brachioradialis (C6) 2/4  Lower Extremity Lower Extremity  quadriceps (L-2 to L-4) 2/4                 quadriceps (L-2 to L-4) 2/4 Achilles (S1) 1/4                                  Achilles (S1) 1/4 Cerebellar: normal finger-to-nose Gait: deferred  Medications  Current Facility-Administered Medications:  .  acetaminophen (TYLENOL) tablet 500 mg, 500 mg, Oral, Q6H PRN, Mikey College T, MD .  ceFAZolin (ANCEF) IVPB 2g/100 mL premix, 2 g, Intravenous, On Call to OR, Sherrie George, PA-C .  cholecalciferol (VITAMIN D3) tablet 1,000 Units, 1,000 Units, Oral, Daily, Mikey College T, MD, 1,000 Units at 07/14/19 [phone] .  hydrochlorothiazide (HYDRODIURIL) tablet 25 mg, 25 mg, Oral, Daily, Mikey College T, MD, 25 mg at 07/14/19 9604 .  levothyroxine (SYNTHROID) tablet 150 mcg, 150 mcg, Oral, Q0600, Mikey College T, MD .  lisinopril (ZESTRIL) tablet 10 mg, 10 mg, Oral, Daily, Mikey College T, MD, 10 mg at 07/14/19 5409 .  metFORMIN (GLUCOPHAGE) tablet 1,000 mg, 1,000 mg, Oral, Q breakfast, Mikey College T, MD,  1,000 mg at 07/14/19 [phone] .  metFORMIN (GLUCOPHAGE) tablet 500 mg, 500 mg, Oral, Q supper, Mikey College T, MD, 500 mg at 07/13/19 1618 .  mirabegron ER (MYRBETRIQ) tablet 50 mg, 50 mg, Oral, Daily, Mikey College T, MD, 50 mg at 07/14/19 [phone] .  predniSONE (DELTASONE) tablet 50 mg, 50 mg, Oral, BID WC, Zhang, Ping T, MD .  senna-docusate (Senokot-S) tablet 1 tablet, 1 tablet, Oral, QHS PRN, Mikey College T, MD .  sodium bicarbonate 150 mEq in sterile water 1,000 mL infusion, , Intravenous, Continuous, Mikey College T, MD, Last Rate: 125 mL/hr at 07/14/19 0041, New Bag at 07/14/19 0041 Labs CBC    Component Value Date/Time   WBC 5.3 07/14/2019 0529   RBC 4.01 07/14/2019 0529   HGB 11.4 (L) 07/14/2019 0529   HCT 36.0 07/14/2019 0529   PLT 333 07/14/2019 0529   MCV 89.8 07/14/2019 0529   MCH 28.4 07/14/2019 0529   MCHC 31.7 07/14/2019 0529   RDW 15.6 (H) 07/14/2019 0529   LYMPHSABS 2.0 07/13/2019 1020   MONOABS 0.4 07/13/2019 1020   EOSABS 0.1 07/13/2019 1020   BASOSABS 0.1 07/13/2019 1020    CMP     Component Value Date/Time   NA 140 07/14/2019 0529   NA 138 07/08/2019 1355   K 3.3 (L) 07/14/2019 0529   CL 97 (L) 07/14/2019 0529   CO2 32 07/14/2019 0529   GLUCOSE 141 (H) 07/14/2019 0529   BUN 7 07/14/2019 0529   BUN 6 07/08/2019 1355   CREATININE 0.67 07/14/2019 0529   CALCIUM 9.6 07/14/2019 0529   PROT 6.3 (L) 07/14/2019 0529   PROT 7.2 07/08/2019 1355   ALBUMIN 3.6 07/14/2019 0529   ALBUMIN 4.5 07/08/2019 1355   AST 246 (H) 07/14/2019 0529   ALT 273 (H) 07/14/2019 0529   ALKPHOS 56 07/14/2019 0529   BILITOT 1.0 07/14/2019 0529   BILITOT 0.5 07/08/2019 1355   GFRNONAA >60 07/14/2019 0529   GFRAA >60 07/14/2019 0529    Imaging No imaging to reviwe  Assessment:  This is a 59 year old female sent to hospital  by Dr. Allena Katz due to proximal myopathy manifesting with progressive weakness and nontraumatic rhabdomyolysis.  CK 1900+, normal renal function.  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   There is a very high clinical suspicion for autoimmune myositis, such as anti-- HMGCR myopathy.   Dr. Allena Katz has DC'd patient's Lipitor, myositis panel is pending, and Dr. Allena Katz has sent out myopathy to panel to [facility] neuromuscular lab, which includes HMGCR antibody High risk for renal function deterioration/rhabdo with the hyperCKemia, hence sent in for hospital admission and medical management as well as expedited muscle biopsy (treatment if tissue diagnosis is made would be steroids and she is a diabetic so need to be essentially certain of the diagnosis prior to initiating steroids)  Scheduled for surgery today - CK today in 12K range improved from yesterday  Recommendations: Appreciate surgery assistance with the biopsy Further recs after procedure Continue hydration and checking labs including CK  -- Milon Dikes, MD [facility] Pager: [phone] If 7pm to 7am, please call on call as listed on AMION.

## 2019-07-14 NOTE — Progress Notes (Signed)
Subjective: CC: No complaints. No changes overnight. Ambulating in room with IV pole. No chest pain, sob or abdominal pain. Informed her that we had to push her case back to tomorrow morning 2/2 an emergency case that was added to the OR schedule today. She was very polite about this and understanding.   Objective: Vital signs in last 24 hours: Temp:  [97.9 F (36.6 C)-98.1 F (36.7 C)] 98 F (36.7 C) (02/25 0524) Pulse Rate:  [66-86] 82 (02/25 0524) Resp:  [11-19] 18 (02/25 0524) BP: (101-124)/(64-85) 110/76 (02/25 0832) SpO2:  [96 %-99 %] 97 % (02/25 0524)    Intake/Output from previous day: No intake/output data recorded. Intake/Output this shift: No intake/output data recorded.  PE: Gen:  Alert, NAD, pleasant Card:  RRR, no M/G/R heard Pulm:  CTAB, no W/R/R, effort normal Abd: Soft, NT/ND, +BS Ext:  No LE edema Psych: A&Ox3  Skin: no rashes noted, warm and dry  Lab Results:  Recent Labs    07/13/19 1020 07/14/19 0529  WBC 4.8 5.3  HGB 12.0 11.4*  HCT 38.6 36.0  PLT 346 333   BMET Recent Labs    07/13/19 1020 07/14/19 0529  NA 138 140  K 3.9 3.3*  CL 101 97*  CO2 29 32  GLUCOSE 112* 141*  BUN 5* 7  CREATININE 0.58 0.67  CALCIUM 9.8 9.6   PT/INR Recent Labs    07/13/19 1020  LABPROT 13.1  INR 1.0   CMP     Component Value Date/Time   NA 140 07/14/2019 0529   NA 138 07/08/2019 1355   K 3.3 (L) 07/14/2019 0529   CL 97 (L) 07/14/2019 0529   CO2 32 07/14/2019 0529   GLUCOSE 141 (H) 07/14/2019 0529   BUN 7 07/14/2019 0529   BUN 6 07/08/2019 1355   CREATININE 0.67 07/14/2019 0529   CALCIUM 9.6 07/14/2019 0529   PROT 6.3 (L) 07/14/2019 0529   PROT 7.2 07/08/2019 1355   ALBUMIN 3.6 07/14/2019 0529   ALBUMIN 4.5 07/08/2019 1355   AST 246 (H) 07/14/2019 0529   ALT 273 (H) 07/14/2019 0529   ALKPHOS 56 07/14/2019 0529   BILITOT 1.0 07/14/2019 0529   BILITOT 0.5 07/08/2019 1355   GFRNONAA >60 07/14/2019 0529   GFRAA >60 07/14/2019  0529   Lipase  No results found for: LIPASE     Studies/Results: NCV with EMG(electromyography)  Result Date: 07/12/2019 Glendale Chard, DO     07/12/2019  1:07 PM [facility] [address] [place], [address]  [place], [place] [address] Tel: [phone] Fax:  [phone] Test Date:  07/12/2019 Patient: Andrea Delacruz DOB: 1961-05-01 Physician: Nita Sickle, DO Sex: Female Height: ' 0" Ref Phys: Nita Sickle, DO ID#: [id number] Temp: 32.0C Technician:  Patient Complaints: This is a 59 year old female with hyperCKemia and generalized weakness referred for evaluation of myopathy. NCV & EMG Findings: Extensive electrodiagnostic testing of the left upper and lower extremity shows: 1. Left median and sural sensory responses are within normal limits. 2. The left median, ulnar, and peroneal motor responses are within normal limits. 3. Diffuse small, complex motor units are seen affecting the pronator teres, brachioradialis, biceps, triceps, deltoid, infraspinatus, gluteus medius, rectus femoris, iliacus, and medial gastrocnemius muscles with evidence of muscle membrane irritability. 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 ___________________________ Nita Sickle, DO Nerve Conduction Studies Anti Sensory Summary Table  Site NR Peak (ms)  Norm Peak (ms) P-T Amp (V) Norm P-T Amp Left Median Anti Sensory (2nd Digit)  32C Wrist    3.3 <3.6 41.3 >15 Left Sural Anti Sensory (Lat Mall)  32C Calf    2.7 <4.6 17.4 >4 Motor Summary Table  Site NR Onset (ms) Norm Onset (ms) O-P Amp (mV) Norm O-P Amp Site1 Site2 Delta-0 (ms) Dist (cm) Vel (m/s) Norm Vel (m/s) Left Median Motor (Abd Poll Brev)  32C Wrist    3.1 <4.0 8.8 >6 Elbow Wrist 4.7 28.0 60 >50 Elbow    7.8  8.8        Left Peroneal Motor (Ext Dig Brev)  32C Ankle    3.8 <6.0 5.2 >2.5 B Fib Ankle 6.8 35.0 51 >40 B Fib    10.6  4.6  Poplt B Fib 1.4 9.0 64 >40 Poplt    12.0   4.2        Left Peroneal TA Motor (Tib Ant)  32C Fib Head    2.5 <4.5 4.0 >3 Poplit Fib Head 1.4 9.0 64 >40 Poplit    3.9  4.0        Left Ulnar Motor (Abd Dig Minimi)  32C Wrist    2.7 <3.1 11.1 >7 B Elbow Wrist 3.9 23.0 59 >50 B Elbow    6.6  10.7  A Elbow B Elbow 2.0 10.0 50 >50 A Elbow    8.6  10.2        EMG  Side Muscle Ins Act Fibs Psw Fasc Number Recrt Dur Dur. Amp Amp. Poly Poly. Comment Right BicepsFemS Nml Nml Nml Nml Nml Nml Nml Nml Nml Nml Nml Nml N/A Left 1stDorInt Nml Nml Nml Nml Nml Nml Nml Nml Nml Nml Nml Nml N/A Left BrachioRad 1+ 1+ Nml Nml Nml Nml Some 1+ Some 1- Some 1+ E.R. Left FlexPolLong Nml Nml Nml Nml Nml Nml Nml Nml Nml Nml Nml Nml N/A Left PronatorTeres Nml Nml Nml Nml Nml Nml Few 1+ Few 1- Few 1+ N/A Left Biceps Nml 2+ Nml Nml Nml Nml Many 1+ Many 1- Many 1+ E.R. Left Triceps Nml 1+ Nml Nml Nml Nml Some 1+ Some 1- Some 1+ E.R. Left Deltoid Nml 1+ Nml Nml Nml Nml Many 1+ Many 1- Many 1+ E.R. Left Cervical Parasp Low Nml 1+ Nml Nml NE Nml - - - - - - N/A Left Infraspinatus 2+ 1+ Nml Nml Nml Nml Most 1+ Most 1- Most 1+ E.R. Left GluteusMed 2+ 1+ Nml Nml Nml Nml All 1+ All 1+ All 1+ E.R. Left AntTibialis Nml Nml Nml Nml Nml Nml Nml Nml Nml Nml Nml Nml N/A Left Gastroc Nml 1+ Nml Nml Nml Nml Few 1+ Few 1- Few 1+ N/A Left RectFemoris 2+ 1+ Nml Nml Nml Nml All 1+ All 1- All 1+ N/A Left Iliacus 1- Nml Nml Nml Nml Nml Most 1+ Most 1+ Most 1+ N/A Waveforms:          Anti-infectives: Anti-infectives (From admission, onward)   Start     Dose/Rate Route Frequency Ordered Stop   07/14/19 0600  ceFAZolin (ANCEF) IVPB 2g/100 mL premix     2 g 200 mL/hr over 30 Minutes Intravenous On call to O.R. 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 Redmond Pulling.  -  Okay for diet today. NPO at midnight    FEN - HH/CM. NPO at midnight VTE - SCDs, chemical prophylaxis per primary team  ID - Ancef on call to OR   LOS: 1 day    Jacinto Halim , [facility] 07/14/2019, 9:54 AM Please see Amion for pager number during day hours 7:00am-4:30pm

## 2019-07-14 NOTE — H&P (View-Only) (Signed)
     Subjective: CC: No complaints. No changes overnight. Ambulating in room with IV pole. No chest pain, sob or abdominal pain. Informed her that we had to push her case back to tomorrow morning 2/2 an emergency case that was added to the OR schedule today. She was very polite about this and understanding.   Objective: Vital signs in last 24 hours: Temp:  [97.9 F (36.6 C)-98.1 F (36.7 C)] 98 F (36.7 C) (02/25 0524) Pulse Rate:  [66-86] 82 (02/25 0524) Resp:  [11-19] 18 (02/25 0524) BP: (101-124)/(64-85) 110/76 (02/25 0832) SpO2:  [96 %-99 %] 97 % (02/25 0524)    Intake/Output from previous day: No intake/output data recorded. Intake/Output this shift: No intake/output data recorded.  PE: Gen:  Alert, NAD, pleasant Card:  RRR, no M/G/R heard Pulm:  CTAB, no W/R/R, effort normal Abd: Soft, NT/ND, +BS Ext:  No LE edema Psych: A&Ox3  Skin: no rashes noted, warm and dry  Lab Results:  Recent Labs    07/13/19 1020 07/14/19 0529  WBC 4.8 5.3  HGB 12.0 11.4*  HCT 38.6 36.0  PLT 346 333   BMET Recent Labs    07/13/19 1020 07/14/19 0529  NA 138 140  K 3.9 3.3*  CL 101 97*  CO2 29 32  GLUCOSE 112* 141*  BUN 5* 7  CREATININE 0.58 0.67  CALCIUM 9.8 9.6   PT/INR Recent Labs    07/13/19 1020  LABPROT 13.1  INR 1.0   CMP     Component Value Date/Time   NA 140 07/14/2019 0529   NA 138 07/08/2019 1355   K 3.3 (L) 07/14/2019 0529   CL 97 (L) 07/14/2019 0529   CO2 32 07/14/2019 0529   GLUCOSE 141 (H) 07/14/2019 0529   BUN 7 07/14/2019 0529   BUN 6 07/08/2019 1355   CREATININE 0.67 07/14/2019 0529   CALCIUM 9.6 07/14/2019 0529   PROT 6.3 (L) 07/14/2019 0529   PROT 7.2 07/08/2019 1355   ALBUMIN 3.6 07/14/2019 0529   ALBUMIN 4.5 07/08/2019 1355   AST 246 (H) 07/14/2019 0529   ALT 273 (H) 07/14/2019 0529   ALKPHOS 56 07/14/2019 0529   BILITOT 1.0 07/14/2019 0529   BILITOT 0.5 07/08/2019 1355   GFRNONAA >60 07/14/2019 0529   GFRAA >60 07/14/2019  0529   Lipase  No results found for: LIPASE     Studies/Results: NCV with EMG(electromyography)  Result Date: 07/12/2019 Patel, Donika K, DO     07/12/2019  1:07 PM [facility] [address], [address]  [place], [place] [address] Tel: [phone] Fax:  [phone] Test Date:  07/12/2019 Patient: Andrea Delacruz DOB: 12/25/1960 Physician: Donika Patel, DO Sex: Female Height: ' 0" Ref Phys: Donika Patel, DO ID#: [id number] Temp: 32.0C Technician:  Patient Complaints: This is a 58-year-old female with hyperCKemia and generalized weakness referred for evaluation of myopathy. NCV & EMG Findings: Extensive electrodiagnostic testing of the left upper and lower extremity shows: 1. Left median and sural sensory responses are within normal limits. 2. The left median, ulnar, and peroneal motor responses are within normal limits. 3. Diffuse small, complex motor units are seen affecting the pronator teres, brachioradialis, biceps, triceps, deltoid, infraspinatus, gluteus medius, rectus femoris, iliacus, and medial gastrocnemius muscles with evidence of muscle membrane irritability. 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 ___________________________ Donika Patel, DO Nerve Conduction Studies Anti Sensory Summary Table  Site NR Peak (ms)   Norm Peak (ms) P-T Amp (V) Norm P-T Amp Left Median Anti Sensory (2nd Digit)  32C Wrist    3.3 <3.6 41.3 >15 Left Sural Anti Sensory (Lat Mall)  32C Calf    2.7 <4.6 17.4 >4 Motor Summary Table  Site NR Onset (ms) Norm Onset (ms) O-P Amp (mV) Norm O-P Amp Site1 Site2 Delta-0 (ms) Dist (cm) Vel (m/s) Norm Vel (m/s) Left Median Motor (Abd Poll Brev)  32C Wrist    3.1 <4.0 8.8 >6 Elbow Wrist 4.7 28.0 60 >50 Elbow    7.8  8.8        Left Peroneal Motor (Ext Dig Brev)  32C Ankle    3.8 <6.0 5.2 >2.5 B Fib Ankle 6.8 35.0 51 >40 B Fib    10.6  4.6  Poplt B Fib 1.4 9.0 64 >40 Poplt    12.0   4.2        Left Peroneal TA Motor (Tib Ant)  32C Fib Head    2.5 <4.5 4.0 >3 Poplit Fib Head 1.4 9.0 64 >40 Poplit    3.9  4.0        Left Ulnar Motor (Abd Dig Minimi)  32C Wrist    2.7 <3.1 11.1 >7 B Elbow Wrist 3.9 23.0 59 >50 B Elbow    6.6  10.7  A Elbow B Elbow 2.0 10.0 50 >50 A Elbow    8.6  10.2        EMG  Side Muscle Ins Act Fibs Psw Fasc Number Recrt Dur Dur. Amp Amp. Poly Poly. Comment Right BicepsFemS Nml Nml Nml Nml Nml Nml Nml Nml Nml Nml Nml Nml N/A Left 1stDorInt Nml Nml Nml Nml Nml Nml Nml Nml Nml Nml Nml Nml N/A Left BrachioRad 1+ 1+ Nml Nml Nml Nml Some 1+ Some 1- Some 1+ E.R. Left FlexPolLong Nml Nml Nml Nml Nml Nml Nml Nml Nml Nml Nml Nml N/A Left PronatorTeres Nml Nml Nml Nml Nml Nml Few 1+ Few 1- Few 1+ N/A Left Biceps Nml 2+ Nml Nml Nml Nml Many 1+ Many 1- Many 1+ E.R. Left Triceps Nml 1+ Nml Nml Nml Nml Some 1+ Some 1- Some 1+ E.R. Left Deltoid Nml 1+ Nml Nml Nml Nml Many 1+ Many 1- Many 1+ E.R. Left Cervical Parasp Low Nml 1+ Nml Nml NE Nml - - - - - - N/A Left Infraspinatus 2+ 1+ Nml Nml Nml Nml Most 1+ Most 1- Most 1+ E.R. Left GluteusMed 2+ 1+ Nml Nml Nml Nml All 1+ All 1+ All 1+ E.R. Left AntTibialis Nml Nml Nml Nml Nml Nml Nml Nml Nml Nml Nml Nml N/A Left Gastroc Nml 1+ Nml Nml Nml Nml Few 1+ Few 1- Few 1+ N/A Left RectFemoris 2+ 1+ Nml Nml Nml Nml All 1+ All 1- All 1+ N/A Left Iliacus 1- Nml Nml Nml Nml Nml Most 1+ Most 1+ Most 1+ N/A Waveforms:          Anti-infectives: Anti-infectives (From admission, onward)   Start     Dose/Rate Route Frequency Ordered Stop   07/14/19 0600  ceFAZolin (ANCEF) IVPB 2g/100 mL premix     2 g 200 mL/hr over 30 Minutes Intravenous On call to O.R. 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 Redmond Pulling.  -  Okay for diet today. NPO at midnight    FEN - HH/CM. NPO at midnight VTE - SCDs, chemical prophylaxis per primary team  ID - Ancef on call to OR   LOS: 1 day    Jacinto Halim , [facility] 07/14/2019, 9:54 AM Please see Amion for pager number during day hours 7:00am-4:30pm

## 2019-07-15 ENCOUNTER — Encounter (HOSPITAL_COMMUNITY)
Admission: EM | Disposition: A | Discharge status: Home Health | Payer: Self-pay | Source: Home / Self Care | Attending: Internal Medicine

## 2019-07-15 ENCOUNTER — Inpatient Hospital Stay (HOSPITAL_COMMUNITY): Payer: BC Managed Care – PPO | Admitting: Anesthesiology

## 2019-07-15 ENCOUNTER — Encounter (HOSPITAL_COMMUNITY): Payer: Self-pay | Admitting: Internal Medicine

## 2019-07-15 HISTORY — PX: MUSCLE BIOPSY: SHX716

## 2019-07-15 LAB — BASIC METABOLIC PANEL
Anion gap: 9 (ref 5–15)
BUN: 7 mg/dL (ref 6–20)
CO2: 32 mmol/L (ref 22–32)
Calcium: 9.7 mg/dL (ref 8.9–10.3)
Chloride: 98 mmol/L (ref 98–111)
Creatinine, Ser: 0.83 mg/dL (ref 0.44–1.00)
GFR calc Af Amer: >60 mL/min (ref >60)
GFR calc non Af Amer: >60 mL/min (ref >60)
Glucose, Bld: 138 mg/dL — ABNORMAL HIGH (ref 70–99)
Potassium: 3.5 mmol/L (ref 3.5–5.1)
Sodium: 139 mmol/L (ref 135–145)

## 2019-07-15 LAB — GLUCOSE, CAPILLARY
Glucose-Capillary: 126 mg/dL — ABNORMAL HIGH (ref 70–99)
Glucose-Capillary: 144 mg/dL — ABNORMAL HIGH (ref 70–99)
Glucose-Capillary: 153 mg/dL — ABNORMAL HIGH (ref 70–99)
Glucose-Capillary: 160 mg/dL — ABNORMAL HIGH (ref 70–99)

## 2019-07-15 LAB — CK: Total CK: 10898 U/L — ABNORMAL HIGH (ref 38–234)

## 2019-07-15 SURGERY — MUSCLE BIOPSY
Anesthesia: General | Laterality: Right

## 2019-07-15 MED ORDER — ONDANSETRON HCL 4 MG/2ML IJ SOLN
INTRAMUSCULAR | Status: AC
  Filled 2019-07-15: qty 2

## 2019-07-15 MED ORDER — OXYCODONE HCL 5 MG PO TABS
ORAL_TABLET | ORAL | Status: AC
  Filled 2019-07-15: qty 1

## 2019-07-15 MED ORDER — SODIUM CHLORIDE 0.9 % IV SOLN: INTRAVENOUS | Status: DC

## 2019-07-15 MED ORDER — FENTANYL CITRATE (PF) 100 MCG/2ML IJ SOLN: 25.0000 ug | INTRAMUSCULAR | Status: DC | PRN

## 2019-07-15 MED ORDER — BUPIVACAINE HCL (PF) 0.25 % IJ SOLN
INTRAMUSCULAR | Status: DC | PRN
  Administered 2019-07-15: 20 mL

## 2019-07-15 MED ORDER — PREDNISONE 20 MG PO TABS
60.0000 mg | ORAL_TABLET | Freq: Every day | ORAL | Status: DC
  Administered 2019-07-15 – 2019-07-16 (×2): 60 mg via ORAL
  Filled 2019-07-15 (×2): qty 3

## 2019-07-15 MED ORDER — CALCIUM CARBONATE ANTACID 500 MG PO CHEW
1.0000 | CHEWABLE_TABLET | Freq: Every day | ORAL | Status: DC
  Administered 2019-07-15: 15:00:00 200 mg via ORAL
  Filled 2019-07-15: qty 1

## 2019-07-15 MED ORDER — LACTATED RINGERS IV SOLN: INTRAVENOUS | Status: DC

## 2019-07-15 MED ORDER — 0.9 % SODIUM CHLORIDE (POUR BTL) OPTIME
TOPICAL | Status: DC | PRN
  Administered 2019-07-15: 1000 mL

## 2019-07-15 MED ORDER — LIDOCAINE 2% (20 MG/ML) 5 ML SYRINGE
INTRAMUSCULAR | Status: AC
  Filled 2019-07-15: qty 5

## 2019-07-15 MED ORDER — MIDAZOLAM HCL 2 MG/2ML IJ SOLN
INTRAMUSCULAR | Status: AC
  Filled 2019-07-15: qty 2

## 2019-07-15 MED ORDER — OXYCODONE HCL 5 MG/5ML PO SOLN: 5.0000 mg | Freq: Once | ORAL | Status: DC | PRN

## 2019-07-15 MED ORDER — ONDANSETRON HCL 4 MG/2ML IJ SOLN: 4.0000 mg | Freq: Once | INTRAMUSCULAR | Status: DC | PRN

## 2019-07-15 MED ORDER — PHENYLEPHRINE HCL-NACL 10-0.9 MG/250ML-% IV SOLN
INTRAVENOUS | Status: DC | PRN
  Administered 2019-07-15: 25 ug/min via INTRAVENOUS
  Administered 2019-07-15: 50 ug/min via INTRAVENOUS

## 2019-07-15 MED ORDER — OXYCODONE HCL 5 MG/5ML PO SOLN: 5.0000 mg | Freq: Once | ORAL | Status: AC | PRN

## 2019-07-15 MED ORDER — METFORMIN HCL 500 MG PO TABS
1000.0000 mg | ORAL_TABLET | Freq: Two times a day (BID) | ORAL | Status: DC
  Administered 2019-07-15 – 2019-07-16 (×2): 1000 mg via ORAL
  Filled 2019-07-15 (×2): qty 2

## 2019-07-15 MED ORDER — PHENYLEPHRINE 40 MCG/ML (10ML) SYRINGE FOR IV PUSH (FOR BLOOD PRESSURE SUPPORT)
PREFILLED_SYRINGE | INTRAVENOUS | Status: AC
  Filled 2019-07-15: qty 10

## 2019-07-15 MED ORDER — PANTOPRAZOLE SODIUM 40 MG PO TBEC
40.0000 mg | DELAYED_RELEASE_TABLET | Freq: Every day | ORAL | Status: DC
  Administered 2019-07-15 – 2019-07-16 (×2): 40 mg via ORAL
  Filled 2019-07-15 (×2): qty 1

## 2019-07-15 MED ORDER — SODIUM CHLORIDE 0.9 % IV BOLUS
500.0000 mL | Freq: Once | INTRAVENOUS | Status: AC
  Administered 2019-07-15: 16:00:00 500 mL via INTRAVENOUS

## 2019-07-15 MED ORDER — LIDOCAINE 2% (20 MG/ML) 5 ML SYRINGE
INTRAMUSCULAR | Status: DC | PRN
  Administered 2019-07-15: 60 mg via INTRAVENOUS

## 2019-07-15 MED ORDER — FENTANYL CITRATE (PF) 250 MCG/5ML IJ SOLN
INTRAMUSCULAR | Status: DC | PRN
  Administered 2019-07-15: 25 ug via INTRAVENOUS
  Administered 2019-07-15: 50 ug via INTRAVENOUS

## 2019-07-15 MED ORDER — HEMOSTATIC AGENTS (NO CHARGE) OPTIME
TOPICAL | Status: DC | PRN
  Administered 2019-07-15: 1 via TOPICAL

## 2019-07-15 MED ORDER — PROPOFOL 10 MG/ML IV BOLUS
INTRAVENOUS | Status: DC | PRN
  Administered 2019-07-15: 150 mg via INTRAVENOUS

## 2019-07-15 MED ORDER — BUPIVACAINE HCL (PF) 0.25 % IJ SOLN
INTRAMUSCULAR | Status: AC
  Filled 2019-07-15: qty 30

## 2019-07-15 MED ORDER — OXYCODONE HCL 5 MG PO TABS: 5.0000 mg | ORAL_TABLET | Freq: Once | ORAL | Status: DC | PRN

## 2019-07-15 MED ORDER — MIDAZOLAM HCL 2 MG/2ML IJ SOLN
INTRAMUSCULAR | Status: DC | PRN
  Administered 2019-07-15: 2 mg via INTRAVENOUS

## 2019-07-15 MED ORDER — ONDANSETRON HCL 4 MG/2ML IJ SOLN
INTRAMUSCULAR | Status: DC | PRN
  Administered 2019-07-15: 4 mg via INTRAVENOUS

## 2019-07-15 MED ORDER — PROPOFOL 10 MG/ML IV BOLUS
INTRAVENOUS | Status: AC
  Filled 2019-07-15: qty 20

## 2019-07-15 MED ORDER — PHENYLEPHRINE 40 MCG/ML (10ML) SYRINGE FOR IV PUSH (FOR BLOOD PRESSURE SUPPORT)
PREFILLED_SYRINGE | INTRAVENOUS | Status: DC | PRN
  Administered 2019-07-15 (×5): 80 ug via INTRAVENOUS

## 2019-07-15 MED ORDER — FENTANYL CITRATE (PF) 250 MCG/5ML IJ SOLN
INTRAMUSCULAR | Status: AC
  Filled 2019-07-15: qty 5

## 2019-07-15 MED ORDER — OXYCODONE HCL 5 MG PO TABS
5.0000 mg | ORAL_TABLET | Freq: Once | ORAL | Status: AC | PRN
  Administered 2019-07-15: 10:00:00 5 mg via ORAL

## 2019-07-15 MED ORDER — DEXAMETHASONE SODIUM PHOSPHATE 10 MG/ML IJ SOLN
INTRAMUSCULAR | Status: DC | PRN
  Administered 2019-07-15: 4 mg via INTRAVENOUS

## 2019-07-15 MED ORDER — GLIPIZIDE 5 MG PO TABS
2.5000 mg | ORAL_TABLET | Freq: Two times a day (BID) | ORAL | Status: DC
  Administered 2019-07-15 – 2019-07-16 (×2): 2.5 mg via ORAL
  Filled 2019-07-15 (×2): qty 1
  Filled 2019-07-15 (×3): qty 0.5
  Filled 2019-07-15: qty 1

## 2019-07-15 MED ORDER — DEXAMETHASONE SODIUM PHOSPHATE 10 MG/ML IJ SOLN
INTRAMUSCULAR | Status: AC
  Filled 2019-07-15: qty 1

## 2019-07-15 SURGICAL SUPPLY — 37 items
ADH SKN CLS APL DERMABOND .7 (GAUZE/BANDAGES/DRESSINGS) ×2
APL PRP STRL LF DISP 70% ISPRP (MISCELLANEOUS) ×2
CHLORAPREP W/TINT 26 (MISCELLANEOUS) ×4 IMPLANT
CNTNR URN SCR LID CUP LEK RST (MISCELLANEOUS) ×1 IMPLANT
CONT SPEC 4OZ STRL OR WHT (MISCELLANEOUS) ×6
COVER SURGICAL LIGHT HANDLE (MISCELLANEOUS) ×3 IMPLANT
COVER WAND RF STERILE (DRAPES) ×1 IMPLANT
DERMABOND ADVANCED (GAUZE/BANDAGES/DRESSINGS) ×4
DERMABOND ADVANCED .7 DNX12 (GAUZE/BANDAGES/DRESSINGS) ×1 IMPLANT
DRAPE LAPAROTOMY 100X72 PEDS (DRAPES) ×3 IMPLANT
DRSG DERMACEA 8X12 NADH (GAUZE/BANDAGES/DRESSINGS) ×4 IMPLANT
DRSG TELFA 3X8 NADH (GAUZE/BANDAGES/DRESSINGS) ×3 IMPLANT
ELECT REM PT RETURN 9FT ADLT (ELECTROSURGICAL) ×3
ELECTRODE REM PT RTRN 9FT ADLT (ELECTROSURGICAL) ×1 IMPLANT
GAUZE 4X4 16PLY RFD (DISPOSABLE) ×3 IMPLANT
GLOVE BIOGEL M STRL SZ7.5 (GLOVE) ×3 IMPLANT
GLOVE BIOGEL PI IND STRL 8 (GLOVE) ×2 IMPLANT
GLOVE BIOGEL PI INDICATOR 8 (GLOVE) ×2
GOWN STRL REUS W/ TWL LRG LVL3 (GOWN DISPOSABLE) ×1 IMPLANT
GOWN STRL REUS W/TWL 2XL LVL3 (GOWN DISPOSABLE) ×3 IMPLANT
GOWN STRL REUS W/TWL LRG LVL3 (GOWN DISPOSABLE) ×3
HEMOSTAT SURGICEL 2X14 (HEMOSTASIS) ×2 IMPLANT
KIT BASIN OR (CUSTOM PROCEDURE TRAY) ×3 IMPLANT
KIT TURNOVER KIT B (KITS) ×3 IMPLANT
NDL HYPO 25GX1X1/2 BEV (NEEDLE) ×1 IMPLANT
NEEDLE HYPO 25GX1X1/2 BEV (NEEDLE) ×3 IMPLANT
NS IRRIG 1000ML POUR BTL (IV SOLUTION) ×3 IMPLANT
PACK GENERAL/GYN (CUSTOM PROCEDURE TRAY) ×3 IMPLANT
PAD ARMBOARD 7.5X6 YLW CONV (MISCELLANEOUS) ×3 IMPLANT
PAD DRESSING TELFA 3X8 NADH (GAUZE/BANDAGES/DRESSINGS) ×1 IMPLANT
PENCIL SMOKE EVACUATOR (MISCELLANEOUS) ×3 IMPLANT
SUT MNCRL AB 4-0 PS2 18 (SUTURE) ×4 IMPLANT
SUT MON AB 4-0 PC3 18 (SUTURE) ×3 IMPLANT
SUT VIC AB 3-0 SH 18 (SUTURE) ×5 IMPLANT
SYR CONTROL 10ML LL (SYRINGE) ×5 IMPLANT
TOWEL GREEN STERILE (TOWEL DISPOSABLE) ×3 IMPLANT
TOWEL GREEN STERILE FF (TOWEL DISPOSABLE) ×3 IMPLANT

## 2019-07-15 NOTE — Transfer of Care (Signed)
Immediate Anesthesia Transfer of Care Note  Patient: Andrea Delacruz  Procedure(s) Performed: MUSCLE BIOPSY RIGHT DELTOID AND RIGHT RECTUS FEMORIS MUSCLE (Right )  Patient Location: PACU  Anesthesia Type:General  Level of Consciousness: awake, drowsy and patient cooperative  Airway & Oxygen Therapy: Patient Spontanous Breathing and Patient connected to nasal cannula oxygen  Post-op Assessment: Report given to RN and Post -op Vital signs reviewed and stable  Post vital signs: Reviewed and stable  Last Vitals:  Vitals Value Taken Time  BP 120/62 07/15/19 0925  Temp    Pulse 85 07/15/19 0926  Resp 15 07/15/19 0926  SpO2 100 % 07/15/19 0926  Vitals shown include unvalidated device data.  Last Pain:  Vitals:   07/15/19 0658  TempSrc:   PainSc: 0-No pain      Patients Stated Pain Goal: 3 (07/15/19 1587)  Complications: No apparent anesthesia complications

## 2019-07-15 NOTE — Op Note (Signed)
07/15/2019  9:16 AM  PATIENT:  Andrea Delacruz  59 y.o. 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  She had an elevated CK but normal renal function.  Outpatient work-up was suggestive of necrotizing myopathy likely HMG CR myopathy.  We were asked to perform muscle biopsies of her right deltoid and right femoris muscle.  I had previously discussed risk and benefits of the procedure with the patient which are separately documented  PROCEDURE: After obtaining informed consent and marking the right deltoid and right thigh she was taken to the OR 1 at [facility] placed supine on the operating table.  General LMA anesthesia was established.  Her right shoulder and deltoid and right thigh were prepped and draped in the usual standard surgical fashion with ChloraPrep.  She received IV antibiotic prior to skin incision.  A surgical timeout was performed.  I outlined a vertical incision over her anterior mid proximal thigh and infiltrated some local in the skin and soft tissue.  I then made a 1-1/2 incision with a scalpel.  Subcutaneous fat was dissected with Tresa Endo and electrocautery.  The anterior fascia was encountered and incised vertically thus exposing the right femoris muscle.  Two 3-0 Vicryl sutures were placed in this muscle.  Then using a 15 blade I sharply excised 2 sections of  muscle for a total of around 2 cm.  It was placed in moist gauze.  The cavity was irrigated and hemostasis was achieved with electrocautery.  Additional local was infiltrated in this region.  The fascia was reapproximated with several interrupted 3-0 Vicryl's subcutaneous tissue was closed with interrupted 3-0 Vicryls and the skin was closed with a running 4-0 Monocryl followed by Dermabond  We then turned our attention to the right shoulder area.  Similarly I drew a 1-1/2 inch incision over the right lateral shoulder over the deltoid.  Infiltrated local.  Made a vertical incision.  Deep dermis and subcutaneous fat was divided with electrocautery.  Anterior fascia was incised and spread in a similar fashion sutures were placed in the deltoid muscle and then 2 sections were sharply excised with a 15 blade.  There was a little bit of bleeding.  It was packed while we took care of the specimens.  I then inspected the area and it was no longer bleeding.  I did use electrocautery to obtain hemostasis.  There was a little bit of oozing from the remaining muscle belly again cautery was used.  Additional local was infiltrated in the area.  A small piece of Surgicel was placed in the muscle belly.  Deep fascia was then reapproximated with several interrupted 3-0 Vicryl sutures.  Subcutaneous tissue was closed with interrupted 3-0 Vicryl's and skin was closed with a running 4-0 Monocryl followed by Dermabond.  All needle instrument sponge counts were correct x2.  Patient tolerated seizure well.  No immediate complications.  She was taken  to the recovery room in stable condition  The specimens were sent to pathology wrapped in moist gauze and sent fresh per their instructions  PLAN OF CARE: Admit to inpatient   PATIENT DISPOSITION:  PACU - hemodynamically stable.   Delay start of Pharmacological VTE agent (>24hrs) due to surgical blood loss or risk of bleeding:  no  Andrea Ruff. Redmond Pulling, MD, FACS General, Bariatric, &  Minimally Invasive Surgery [facility], [place]

## 2019-07-15 NOTE — Anesthesia Preprocedure Evaluation (Addendum)
Anesthesia Evaluation  Patient identified by MRN, date of birth, ID band Patient awake    Reviewed: Allergy & Precautions, NPO status , Patient's Chart, lab work & pertinent test results  Airway Mallampati: II  TM Distance: >3 FB Neck ROM: Full    Dental  (+) Teeth Intact, Dental Advisory Given   Pulmonary    breath sounds clear to auscultation       Cardiovascular hypertension,  Rhythm:Regular Rate:Normal     Neuro/Psych    GI/Hepatic   Endo/Other  diabetes  Renal/GU      Musculoskeletal   Abdominal   Peds  Hematology   Anesthesia Other Findings   Reproductive/Obstetrics                             Anesthesia Physical Anesthesia Plan  ASA: III  Anesthesia Plan: General   Post-op Pain Management:    Induction:   PONV Risk Score and Plan: Ondansetron and Dexamethasone  Airway Management Planned: LMA  Additional Equipment:   Intra-op Plan:   Post-operative Plan:   Informed Consent: I have reviewed the patients History and Physical, chart, labs and discussed the procedure including the risks, benefits and alternatives for the proposed anesthesia with the patient or authorized representative who has indicated his/her understanding and acceptance.   Dental advisory given  Plan Discussed with: CRNA and Anesthesiologist  Anesthesia Plan Comments:         Anesthesia Quick Evaluation  

## 2019-07-15 NOTE — Progress Notes (Signed)
Neurology Progress Note   S:// Seen and examined-status post biopsy.  No change in subjective complaints reported   O:// Current vital signs: BP 100/63 (BP Location: Left Arm)   Pulse 83   Temp 98.1 F (36.7 C) (Oral)   Resp 16   SpO2 95%  Vital signs in last 24 hours: Temp:  [97.6 F (36.4 C)-98.2 F (36.8 C)] 98.1 F (36.7 C) (02/26 1212) Pulse Rate:  [77-96] 83 (02/26 1212) Resp:  [13-21] 16 (02/26 1212) BP: (100-122)/(61-75) 100/63 (02/26 1212) SpO2:  [95 %-100 %] 95 % (02/26 1212)  Neuro: No change Mental Status: Alert, oriented, thought content appropriate. Speech fluent without evidence of aphasia. Able to follow 3 step commands without difficulty. Cranial Nerves: II: visual fields grossly normal, pupils equal, round, reactive to light and accommodation III,IV, VI: ptosis not present, extraocular muscles extra-ocular motions intact bilaterally V,VII: smile symmetric, facial light touch sensation normal bilaterally VIII: hearing normal bilaterally IX,X: gag reflex present XI: trapezius strength/neck flexion strength normal bilaterally XII: tongue strength normal  Motor: Right :Upper extremityLeft: Upper extremity 4/5 deltoid4/5 deltoid 5/5 tricep5/5 tricep 5/5 biceps5/5 biceps  5/5wrist flexion5/5 wrist flexion 5/5 wrist extension5/5 wrist extension 5/5 hand grip5/5 hand grip Lower extremityLower extremity 3/5 hip  flexor3/5 hip flexor 5/5 hip adductors5/5 hip adductors 5/5 hip abductors5/5 hip abductors 5/5 quadricep5/5 quadriceps  4/5 hamstrings4/5 hamstrings 5/5 plantar flexion 5/5 plantar flexion 5/5 plantar extension5/5 plantar extension Tone and bulk:normal tone throughout; no atrophy noted Sensory: Pinprick and light touch intact throughout, bilaterally Deep Tendon Reflexes: Right: Upper Extremity Left: Upper extremity   biceps (C-5 to C-6) 2/4 biceps (C-5 to C-6) 2/4 tricep (C7) 2/4triceps (C7) 2/4 Brachioradialis (C6) 2/4Brachioradialis (C6) 2/4  Lower Extremity Lower Extremity  quadriceps (L-2 to L-4) 2/4 quadriceps (L-2 to L-4) 2/4 Achilles (S1)1/4Achilles (S1) 1/4 Cerebellar: normal finger-to-nose Gait: deferred  Medications  Current Facility-Administered Medications:  .  0.9 %  sodium chloride infusion, , Intravenous, Continuous, Elgergawy, Silver Huguenin, MD, Last Rate: 150 mL/hr at 07/15/19 1302, New Bag at 07/15/19 1302 .  acetaminophen (TYLENOL) tablet 500 mg, 500 mg, Oral, Q6H PRN, Maczis, Barth Kirks, PA-C .  cholecalciferol (VITAMIN D3) tablet 1,000 Units, 1,000 Units, Oral, Daily, Jillyn Ledger, PA-C, 1,000 Units at 07/14/19 8841 .  hydrochlorothiazide (HYDRODIURIL) tablet 25 mg, 25 mg, Oral, Daily, Jillyn Ledger, PA-C, 25 mg at 07/14/19 6606 .  insulin aspart (novoLOG) injection 0-9 Units, 0-9 Units, Subcutaneous, TID WC,  Maczis, Barth Kirks, PA-C, 2 Units at 07/15/19 1151 .  levothyroxine (SYNTHROID) tablet 150 mcg, 150 mcg, Oral, Q0600, Maczis, Barth Kirks, PA-C .  lisinopril (ZESTRIL) tablet 10 mg, 10 mg, Oral, Daily, Jillyn Ledger, PA-C, 10 mg at 07/14/19 3016 .  metFORMIN (GLUCOPHAGE) tablet 1,000 mg, 1,000 mg, Oral, BID WC, Elgergawy, Silver Huguenin, MD .  mirabegron ER (MYRBETRIQ) tablet 50 mg, 50 mg, Oral, Daily, Jillyn Ledger, PA-C, 50 mg at 07/14/19 0109 .  oxyCODONE (Oxy IR/ROXICODONE) 5 MG immediate release tablet, , , ,  .  pantoprazole (PROTONIX) EC tablet 40 mg, 40 mg, Oral, Daily, Elgergawy, Silver Huguenin, MD, 40 mg at 07/15/19 1301 .  predniSONE (DELTASONE) tablet 60 mg, 60 mg, Oral, Q breakfast, Elgergawy, Silver Huguenin, MD, 60 mg at 07/15/19 1301 .  senna-docusate (Senokot-S) tablet 1 tablet, 1 tablet, Oral, QHS PRN, Lorelle Gibbs Labs CBC    Component Value Date/Time   WBC 5.3 07/14/2019 0529   RBC 4.01 07/14/2019 0529   HGB 11.4 (  L) 07/14/2019 0529   HCT 36.0 07/14/2019 0529   PLT 333 07/14/2019 0529   MCV 89.8 07/14/2019 0529   MCH 28.4 07/14/2019 0529   MCHC 31.7 07/14/2019 0529   RDW 15.6 (H) 07/14/2019 0529   LYMPHSABS 2.0 07/13/2019 1020   MONOABS 0.4 07/13/2019 1020   EOSABS 0.1 07/13/2019 1020   BASOSABS 0.1 07/13/2019 1020    CMP     Component Value Date/Time   NA 139 07/15/2019 0608   NA 138 07/08/2019 1355   K 3.5 07/15/2019 0608   CL 98 07/15/2019 0608   CO2 32 07/15/2019 0608   GLUCOSE 138 (H) 07/15/2019 0608   BUN 7 07/15/2019 0608   BUN 6 07/08/2019 1355   CREATININE 0.83 07/15/2019 0608   CALCIUM 9.7 07/15/2019 0608   PROT 6.3 (L) 07/14/2019 0529   PROT 7.2 07/08/2019 1355   ALBUMIN 3.6 07/14/2019 0529   ALBUMIN 4.5 07/08/2019 1355   AST 246 (H) 07/14/2019 0529   ALT 273 (H) 07/14/2019 0529   ALKPHOS 56 07/14/2019 0529   BILITOT 1.0 07/14/2019 0529   BILITOT 0.5 07/08/2019 1355   GFRNONAA >60 07/15/2019 0608   GFRAA >60 07/15/2019 0608  CK improved  from before but still 10,898.  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  Likely discharge tomorrow.  -Start prednisone 60 mg daily -Strict glucose management -PPI and calcium supplementation when on steroids -Bactrim thrice weekly -PT OT speech therapy -Follow-up with Dr. Allena Katz in 2 to 3 weeks-her office is aware -Discussed the plan with Dr. Randol Kern as well as the patient on bedside.  -- Milon Dikes, MD [facility] Pager: [phone] If 7pm to 7am, please call on call as listed on AMION.

## 2019-07-15 NOTE — Interval H&P Note (Signed)
History and Physical Interval Note:  07/15/2019 7:27 AM  Andrea Delacruz  has presented today for surgery, with the diagnosis of MYOSITIS.  The various methods of treatment have been discussed with the patient and family. After consideration of risks, benefits and other options for treatment, the patient has consented to  Procedure(s): MUSCLE BIOPSY RIGHT DELTOID AND RIGHT RECTUS FEMORIS MUSCLE (Right) as a surgical intervention.  The patient's history has been reviewed, patient examined, no change in status, stable for surgery.  I have reviewed the patient's chart and labs.  Questions were answered to the patient's satisfaction.    Mary Sella. Andrey Campanile, MD, FACS General, Bariatric, & Minimally Invasive Surgery [facility]   Gaynelle Adu

## 2019-07-15 NOTE — Anesthesia Postprocedure Evaluation (Signed)
Anesthesia Post Note  Patient: Andrea Delacruz  Procedure(s) Performed: MUSCLE BIOPSY RIGHT DELTOID AND RIGHT RECTUS FEMORIS MUSCLE (Right )     Patient location during evaluation: PACU Anesthesia Type: General Level of consciousness: awake and alert Pain management: pain level controlled Vital Signs Assessment: post-procedure vital signs reviewed and stable Respiratory status: spontaneous breathing, nonlabored ventilation, respiratory function stable and patient connected to nasal cannula oxygen Cardiovascular status: blood pressure returned to baseline and stable Postop Assessment: no apparent nausea or vomiting Anesthetic complications: no    Last Vitals:  Vitals:   07/15/19 1048 07/15/19 1212  BP: 118/73 100/63  Pulse: 86 83  Resp: 19 16  Temp: 36.6 C 36.7 C  SpO2: 97% 95%    Last Pain:  Vitals:   07/15/19 1212  TempSrc: Oral  PainSc:                  Eyoel Throgmorton COKER

## 2019-07-15 NOTE — Progress Notes (Signed)
Pt transferred to OR by OR staff.

## 2019-07-15 NOTE — Anesthesia Procedure Notes (Signed)
Procedure Name: LMA Insertion Date/Time: 07/15/2019 8:03 AM Performed by: Adria Dill, CRNA Pre-anesthesia Checklist: Patient identified, Suction available, Emergency Drugs available and Patient being monitored Patient Re-evaluated:Patient Re-evaluated prior to induction Oxygen Delivery Method: Circle system utilized Preoxygenation: Pre-oxygenation with 100% oxygen Induction Type: IV induction LMA: LMA inserted LMA Size: 4.0 Number of attempts: 1 Placement Confirmation: positive ETCO2 and breath sounds checked- equal and bilateral Dental Injury: Teeth and Oropharynx as per pre-operative assessment

## 2019-07-15 NOTE — Progress Notes (Signed)
PROGRESS NOTE                                                                                                                                                                                                             Patient Demographics:    Andrea Delacruz, is a 59 y.o. female, DOB - [birth year], TMH:[id number]  Admit date - 07/13/2019   Admitting Physician Emeline General, MD  Outpatient Primary MD for the patient is Juluis Rainier, MD  LOS - 2   Chief Complaint  Patient presents with  . Generalized Body Aches       Brief Narrative    59 y.o. female with medical history significant of thyroid disease s/p radioactive ablation more than 30 years ago, obesity, hypertension and IIDM presented with increasing bilateral leg weakness of bilateral shoulders weakness for 61-months. She is followed by Dr. Allena Katz [facility]. PCP Dr. Allena Katz sent patient to ED to be further evaluated by neurology, muscle biopsy and significant hydration due to elevated CK and concern for myositis.  Started to feeling difficulty climbing up stairs since around November 2020, and gradually getting worse, now has to pull up legs to climb stairs, also much difficulty raising her arms above her shoulders. She denies any pain, cramps, dark-colored urine.  She said her symptoms were more severe in the late part of the day, denied any short of breath, no swallowing problems, no constipation.  Denied any double vision or blurry vision.  Denied any rash.    Subjective:    Andrea Delacruz today is in her hips and shoulder areas .   Assessment  & Plan :    Active Problems:   Myositis   Rhabdomyolysis   Proximal myopathy/nontraumatic rhabdomyolysis  -High clinical suspicion for myositis, with significantly elevated total CK on presentation,. -Muscle biopsy by general surgery 2/26. -Discussed with neurology, can start on oral prednisone, will start prednisone 60 mg oral daily,  start on PPI and calcium supplements as well. -We will start on Bactrim thrice weekly for prophylaxis. -PT/OT consulted. -Patient with significantly elevated total CK of 20 K on presentation , trending down, continue with IV fluids, I will change bicarb drip to normal saline given her bicarb of 32.  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 twice daily, meanwhile keep on insulin sliding scale during hospital stay.  HTN Continue home meds.  COVID-19 Labs  Recent Labs    07/13/19 1020  LDH 1,280*  CRP <0.5    Lab Results  Component Value Date   SARSCOV2NAA NEGATIVE 07/13/2019     Code Status : Full  Family Communication  : D/W husband at bedside  Disposition Plan  :Home  Barriers For Discharge : remains on IV fluids, total CK remains elevated , so hopefully home tomorrow her total CK significantly trended down.  Consults  :  General Surgery, Neurology  Procedures  :  PROCEDURE:  Procedure(s): MUSCLE BIOPSY RIGHT DELTOID AND RIGHT RECTUS FEMORIS MUSCLE  SURGEON:  Surgeon(s): Gaynelle Adu, MD 07/15/2019  DVT Prophylaxis  : [place]arton lovenox  Lab Results  Component Value Date   PLT 333 07/14/2019    Antibiotics  :    Anti-infectives (From admission, onward)   Start     Dose/Rate Route Frequency Ordered Stop   07/15/19 0600  ceFAZolin (ANCEF) IVPB 2g/100 mL premix     2 g 200 mL/hr over 30 Minutes Intravenous On call to O.R. 07/14/19 1507 07/15/19 0836   07/14/19 0600  ceFAZolin (ANCEF) IVPB 2g/100 mL premix  Status:  Discontinued     2 g 200 mL/hr over 30 Minutes Intravenous On call to O.R. 07/13/19 1600 07/14/19 1506        Objective:   Vitals:   07/15/19 0955 07/15/19 1010 07/15/19 1048 07/15/19 1212  BP: 114/72  118/73 100/63  Pulse: 77  86 83  Resp: 13  19 16   Temp: 98.2 F (36.8 C) 98.2 F (36.8 C) 97.9  F (36.6 C) 98.1 F (36.7 C)  TempSrc:   Oral Oral  SpO2: 100%  97% 95%    Wt Readings from Last 3 Encounters:  07/12/19 99.2 kg  06/27/19 98.8 kg  05/20/18 91.1 kg     Intake/Output Summary (Last 24 hours) at 07/15/2019 1546 Last data filed at 07/15/2019 1045 Gross per 24 hour  Intake 1780.32 ml  Output 220 ml  Net 1560.32 ml     Physical Exam  Awake Alert, Oriented X 3, No new F.N deficits, Normal affect Symmetrical Chest wall movement, Good air movement bilaterally, CTAB RRR,No Gallops,Rubs or new Murmurs, No Parasternal Heave +ve B.Sounds, Abd Soft, No tenderness, No rebound - guarding or rigidity. No Cyanosis, Clubbing or edema, No new Rash or bruise , surgical biopsy site at the right upper thigh, and right deltoid looks clean with no bleed or discharge      Data Review:    CBC Recent Labs  Lab 07/13/19 1020 07/14/19 0529  WBC 4.8 5.3  HGB 12.0 11.4*  HCT 38.6 36.0  PLT 346 333  MCV 91.3 89.8  MCH 28.4 28.4  MCHC 31.1 31.7  RDW 15.7* 15.6*  LYMPHSABS 2.0  --   MONOABS 0.4  --   EOSABS 0.1  --   BASOSABS 0.1  --     Chemistries  Recent Labs  Lab 07/13/19 1020 07/14/19 0529 07/15/19 0608  NA 138 140 139  K 3.9 3.3* 3.5  CL 101 97* 98  CO2 29 32 32  GLUCOSE 112* 141* 138*  BUN 5* 7 7  CREATININE 0.58 0.67 0.83  CALCIUM 9.8 9.6 9.7  AST 281* 246*  --   ALT 304* 273*  --   ALKPHOS 66 56  --  BILITOT 0.6 1.0  --    ------------------------------------------------------------------------------------------------------------------ No results for input(s): CHOL, HDL, LDLCALC, TRIG, CHOLHDL, LDLDIRECT in the last 72 hours.  Lab Results  Component Value Date   HGBA1C 7.2 (H) 07/14/2019   ------------------------------------------------------------------------------------------------------------------ No results for input(s): TSH, T4TOTAL, T3FREE, THYROIDAB in the last 72 hours.  Invalid input(s):  FREET3 ------------------------------------------------------------------------------------------------------------------ No results for input(s): VITAMINB12, FOLATE, FERRITIN, TIBC, IRON, RETICCTPCT in the last 72 hours.  Coagulation profile Recent Labs  Lab 07/13/19 1020  INR 1.0    No results for input(s): DDIMER in the last 72 hours.  Cardiac Enzymes Recent Labs  Lab 07/14/19 0529  CKMB 288.2*   ------------------------------------------------------------------------------------------------------------------ No results found for: BNP  Inpatient Medications  Scheduled Meds: . calcium carbonate  1 tablet Oral Daily  . cholecalciferol  1,000 Units Oral Daily  . glipiZIDE  2.5 mg Oral BID AC  . hydrochlorothiazide  25 mg Oral Daily  . insulin aspart  0-9 Units Subcutaneous TID WC  . levothyroxine  150 mcg Oral Q0600  . lisinopril  10 mg Oral Daily  . metFORMIN  1,000 mg Oral BID WC  . mirabegron ER  50 mg Oral Daily  . oxyCODONE      . pantoprazole  40 mg Oral Daily  . predniSONE  60 mg Oral Q breakfast   Continuous Infusions: . sodium chloride 150 mL/hr at 07/15/19 1302   PRN Meds:.acetaminophen, senna-docusate  Micro Results Recent Results (from the past 240 hour(s))  Respiratory Panel by RT PCR (Flu A&B, Covid) - Nasopharyngeal Swab     Status: None   Collection Time: 07/13/19  1:20 PM   Specimen: Nasopharyngeal Swab  Result Value Ref Range Status   SARS Coronavirus 2 by RT PCR NEGATIVE NEGATIVE Final    Comment: (NOTE) SARS-CoV-2 target nucleic acids are NOT DETECTED. The SARS-CoV-2 RNA is generally detectable in upper respiratoy specimens during the acute phase of infection. The lowest concentration of SARS-CoV-2 viral copies this assay can detect is 131 copies/mL. A negative result does not preclude SARS-Cov-2 infection and should not be used as the sole basis for treatment or other patient management decisions. A negative result may occur with   improper specimen collection/handling, submission of specimen other than nasopharyngeal swab, presence of viral mutation(s) within the areas targeted by this assay, and inadequate number of viral copies (<131 copies/mL). A negative result must be combined with clinical observations, patient history, and epidemiological information. The expected result is Negative. Fact Sheet for Patients:  PinkCheek.be Fact Sheet for Healthcare Providers:  GravelBags.it This test is not yet ap proved or cleared by the [place] FDA and  has been authorized for detection and/or diagnosis of SARS-CoV-2 by FDA under an Emergency Use Authorization (EUA). This EUA will remain  in effect (meaning this test can be used) for the duration of the COVID-19 declaration under Section 564(b)(1) of the Act, 21 U.S.C. section 360bbb-3(b)(1), unless the authorization is terminated or revoked sooner.    Influenza A by PCR NEGATIVE NEGATIVE Final   Influenza B by PCR NEGATIVE NEGATIVE Final    Comment: (NOTE) The Xpert Xpress SARS-CoV-2/FLU/RSV assay is intended as an aid in  the diagnosis of influenza from Nasopharyngeal swab specimens and  should not be used as a sole basis for treatment. Nasal washings and  aspirates are unacceptable for Xpert Xpress SARS-CoV-2/FLU/RSV  testing. Fact Sheet for Patients: PinkCheek.be Fact Sheet for Healthcare Providers: GravelBags.it This test is not yet approved or cleared by the [place] FDA  and  has been authorized for detection and/or diagnosis of SARS-CoV-2 by  FDA under an Emergency Use Authorization (EUA). This EUA will remain  in effect (meaning this test can be used) for the duration of the  Covid-19 declaration under Section 564(b)(1) of the Act, 21  U.S.C. section 360bbb-3(b)(1), unless the authorization is  terminated or revoked. Performed at  [facility], [address], [place], [place] [address]     Radiology Reports NCV with EMG(electromyography)  Result Date: 07/12/2019 Glendale Chard, DO     07/12/2019  1:07 PM [facility] [address] [place], [address]  [place], [place] [address] Tel: [phone] Fax:  [phone] Test Date:  07/12/2019 Patient: Takesha Steger DOB: 1960-06-11 Physician: Nita Sickle, DO Sex: Female Height: ' 0" Ref Phys: Nita Sickle, DO ID#: [id number] Temp: 32.0C Technician:  Patient Complaints: This is a 59 year old female with hyperCKemia and generalized weakness referred for evaluation of myopathy. NCV & EMG Findings: Extensive electrodiagnostic testing of the left upper and lower extremity shows: 1. Left median and sural sensory responses are within normal limits. 2. The left median, ulnar, and peroneal motor responses are within normal limits. 3. Diffuse small, complex motor units are seen affecting the pronator teres, brachioradialis, biceps, triceps, deltoid, infraspinatus, gluteus medius, rectus femoris, iliacus, and medial gastrocnemius muscles with evidence of muscle membrane irritability. 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 ___________________________ Nita Sickle, DO Nerve Conduction Studies Anti Sensory Summary Table  Site NR Peak (ms) Norm Peak (ms) P-T Amp (V) Norm P-T Amp Left Median Anti Sensory (2nd Digit)  32C Wrist    3.3 <3.6 41.3 >15 Left Sural Anti Sensory (Lat Mall)  32C Calf    2.7 <4.6 17.4 >4 Motor Summary Table  Site NR Onset (ms) Norm Onset (ms) O-P Amp (mV) Norm O-P Amp Site1 Site2 Delta-0 (ms) Dist (cm) Vel (m/s) Norm Vel (m/s) Left Median Motor (Abd Poll Brev)  32C Wrist    3.1 <4.0 8.8 >6 Elbow Wrist 4.7 28.0 60 >50 Elbow    7.8  8.8        Left Peroneal Motor (Ext Dig Brev)  32C Ankle    3.8 <6.0 5.2 >2.5 B Fib Ankle 6.8 35.0 51 >40 B Fib    10.6  4.6  Poplt B Fib 1.4 9.0 64  >40 Poplt    12.0  4.2        Left Peroneal TA Motor (Tib Ant)  32C Fib Head    2.5 <4.5 4.0 >3 Poplit Fib Head 1.4 9.0 64 >40 Poplit    3.9  4.0        Left Ulnar Motor (Abd Dig Minimi)  32C Wrist    2.7 <3.1 11.1 >7 B Elbow Wrist 3.9 23.0 59 >50 B Elbow    6.6  10.7  A Elbow B Elbow 2.0 10.0 50 >50 A Elbow    8.6  10.2        EMG  Side Muscle Ins Act Fibs Psw Fasc Number Recrt Dur Dur. Amp Amp. Poly Poly. Comment Right BicepsFemS Nml Nml Nml Nml Nml Nml Nml Nml Nml Nml Nml Nml N/A Left 1stDorInt Nml Nml Nml Nml Nml Nml Nml Nml Nml Nml Nml Nml N/A Left BrachioRad 1+ 1+ Nml Nml Nml Nml Some 1+ Some 1- Some 1+ E.R. Left FlexPolLong Nml Nml Nml Nml Nml Nml Nml Nml Nml Nml Nml Nml N/A Left PronatorTeres  Nml Nml Nml Nml Nml Nml Few 1+ Few 1- Few 1+ N/A Left Biceps Nml 2+ Nml Nml Nml Nml Many 1+ Many 1- Many 1+ E.R. Left Triceps Nml 1+ Nml Nml Nml Nml Some 1+ Some 1- Some 1+ E.R. Left Deltoid Nml 1+ Nml Nml Nml Nml Many 1+ Many 1- Many 1+ E.R. Left Cervical Parasp Low Nml 1+ Nml Nml NE Nml - - - - - - N/A Left Infraspinatus 2+ 1+ Nml Nml Nml Nml Most 1+ Most 1- Most 1+ E.R. Left GluteusMed 2+ 1+ Nml Nml Nml Nml All 1+ All 1+ All 1+ E.R. Left AntTibialis Nml Nml Nml Nml Nml Nml Nml Nml Nml Nml Nml Nml N/A Left Gastroc Nml 1+ Nml Nml Nml Nml Few 1+ Few 1- Few 1+ N/A Left RectFemoris 2+ 1+ Nml Nml Nml Nml All 1+ All 1- All 1+ N/A Left Iliacus 1- Nml Nml Nml Nml Nml Most 1+ Most 1+ Most 1+ N/A Waveforms:            Huey Bienenstock M.D on 07/15/2019 at 3:46 PM  Between 7am to 7pm - Pager - [phone]  After 7pm go to [email or web address] - password [facility]  [facility] -  Office  [phone]

## 2019-07-16 LAB — BASIC METABOLIC PANEL
Anion gap: 9 (ref 5–15)
BUN: 9 mg/dL (ref 6–20)
CO2: 26 mmol/L (ref 22–32)
Calcium: 9.3 mg/dL (ref 8.9–10.3)
Chloride: 101 mmol/L (ref 98–111)
Creatinine, Ser: 0.7 mg/dL (ref 0.44–1.00)
GFR calc Af Amer: >60 mL/min (ref >60)
GFR calc non Af Amer: >60 mL/min (ref >60)
Glucose, Bld: 151 mg/dL — ABNORMAL HIGH (ref 70–99)
Potassium: 3.7 mmol/L (ref 3.5–5.1)
Sodium: 136 mmol/L (ref 135–145)

## 2019-07-16 LAB — CBC
HCT: 35.6 % — ABNORMAL LOW (ref 36.0–46.0)
Hemoglobin: 11 g/dL — ABNORMAL LOW (ref 12.0–15.0)
MCH: 27.9 pg (ref 26.0–34.0)
MCHC: 30.9 g/dL (ref 30.0–36.0)
MCV: 90.4 fL (ref 80.0–100.0)
Platelets: 328 10*3/uL (ref 150–400)
RBC: 3.94 MIL/uL (ref 3.87–5.11)
RDW: 15.4 % (ref 11.5–15.5)
WBC: 10.5 10*3/uL (ref 4.0–10.5)
nRBC: 0 % (ref 0.0–0.2)

## 2019-07-16 LAB — GLUCOSE, CAPILLARY
Glucose-Capillary: 113 mg/dL — ABNORMAL HIGH (ref 70–99)
Glucose-Capillary: 113 mg/dL — ABNORMAL HIGH (ref 70–99)

## 2019-07-16 LAB — CK
Total CK: 8340 U/L — ABNORMAL HIGH (ref 38–234)
Total CK: 8415 U/L — ABNORMAL HIGH (ref 38–234)

## 2019-07-16 MED ORDER — SULFAMETHOXAZOLE-TRIMETHOPRIM 800-160 MG PO TABS: 1.0000 | ORAL_TABLET | ORAL | 0 refills | Status: DC

## 2019-07-16 MED ORDER — METFORMIN HCL 1000 MG PO TABS: 1000.0000 mg | ORAL_TABLET | Freq: Two times a day (BID) | ORAL | 0 refills | Status: DC

## 2019-07-16 MED ORDER — CALCIUM CARBONATE 1250 (500 CA) MG PO TABS
1.0000 | ORAL_TABLET | Freq: Every day | ORAL | Status: DC
  Administered 2019-07-16: 08:00:00 500 mg via ORAL
  Filled 2019-07-16: qty 1

## 2019-07-16 MED ORDER — CALCIUM CARBONATE 1250 (500 CA) MG PO TABS: 1.0000 | ORAL_TABLET | Freq: Every day | ORAL | 0 refills | Status: AC

## 2019-07-16 MED ORDER — GLIPIZIDE 5 MG PO TABS: 2.5000 mg | ORAL_TABLET | Freq: Two times a day (BID) | ORAL | 0 refills | Status: DC

## 2019-07-16 MED ORDER — PREDNISONE 20 MG PO TABS: 60.0000 mg | ORAL_TABLET | Freq: Every day | ORAL | 0 refills | Status: DC

## 2019-07-16 MED ORDER — SODIUM CHLORIDE 0.9 % IV BOLUS
1000.0000 mL | Freq: Once | INTRAVENOUS | Status: AC
  Administered 2019-07-16: 08:00:00 1000 mL via INTRAVENOUS

## 2019-07-16 MED ORDER — PANTOPRAZOLE SODIUM 40 MG PO TBEC: 40.0000 mg | DELAYED_RELEASE_TABLET | Freq: Every day | ORAL | 0 refills | Status: DC

## 2019-07-16 NOTE — Progress Notes (Signed)
NEUROLOGY PROGRESS NOTE  S:// No acute changes  O:// Unchanged neurological exam Mental Status: Alert, oriented, thought content appropriate. Speech fluent without evidence of aphasia. Able to follow 3 step commands without difficulty. Cranial Nerves: II: visual fields grossly normal, pupils equal, round, reactive to light and accommodation III,IV, VI: ptosis not present, extraocular muscles extra-ocular motions intact bilaterally V,VII: smile symmetric, facial light touch sensation normal bilaterally VIII: hearing normal bilaterally IX,X: gag reflex present XI: trapezius strength/neck flexion strength normal bilaterally XII: tongue strength normal  Motor: Right :Upper extremityLeft: Upper extremity 4/5 deltoid4/5 deltoid 5/5 tricep5/5 tricep 5/5 biceps5/5 biceps  5/5wrist flexion5/5 wrist flexion 5/5 wrist extension5/5 wrist extension 5/5 hand grip5/5 hand grip Lower extremityLower extremity 3/5 hip flexor3/5 hip flexor 5/5 hip adductors5/5 hip adductors 5/5 hip abductors5/5 hip abductors 5/5 quadricep5/5 quadriceps  4/5 hamstrings4/5 hamstrings 5/5 plantar flexion  5/5 plantar flexion 5/5 plantar extension5/5 plantar extension Tone and bulk:normal tone throughout; no atrophy noted Sensory: Pinprick and light touch intact throughout, bilaterally Deep Tendon Reflexes: Right: Upper Extremity Left: Upper extremity   biceps (C-5 to C-6) 2/4 biceps (C-5 to C-6) 2/4 tricep (C7) 2/4triceps (C7) 2/4 Brachioradialis (C6) 2/4Brachioradialis (C6) 2/4  Lower Extremity Lower Extremity  quadriceps (L-2 to L-4) 2/4 quadriceps (L-2 to L-4) 2/4 Achilles (S1)1/4Achilles (S1) 1/4 Cerebellar: normal finger-to-nose Gait: deferred   Labs [id number] Glucose 151  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 Status post muscle biopsy-report pending. Outpatient myositis panel pending.  Recs: -Another bolus of NS and check CK again at 11 per Dr. Elgergawy -Prednisone 60 mg daily -Strict glucose management -PPI and calcium supplementation when on steroids -Bactrim thrice weekly -PT OT speech therapy-follow outpatient recommendations. -Follow-up with Dr. Patel in 2 to 3 weeks-her office is aware -Follow-up with primary care in 1 week  Discussed in detail with the patient.  Answered all her questions. Discussed my plan with Dr. Elgergawy over the phone.  -- Reannah Totten, MD [facility] Pager: [phone] If 7pm to 7am, please call on call as listed on AMION.  

## 2019-07-16 NOTE — Discharge Summary (Signed)
Andrea Delacruz, is a 59 y.o. female  DOB [birth year]  MRN [id number].  Admission date:  07/13/2019  Admitting Physician  Emeline GeneralPing T Zhang, MD  Discharge Date:  07/16/2019   Primary MD  Juluis RainierBarnes, Elizabeth, MD  Recommendations for primary care physician for things to follow:  -Please check CBC, CMP, total CK during next visit. -Adjust hypoglycemic regimen as needed, patient has been started on steroids. -Patient to follow with neurology as an outpatient.   Admission Diagnosis  Rhabdomyolysis [M62.82] Myositis of multiple sites, unspecified myositis type [M60.9]   Discharge Diagnosis  Rhabdomyolysis [M62.82] Myositis of multiple sites, unspecified myositis type [M60.9]    Active Problems:   Myositis   Rhabdomyolysis      Past Medical History:  Diagnosis Date  . Complication of anesthesia   . Diabetes mellitus without complication (HCC)   . Hypertension   . Obesity   . PONV (postoperative nausea and vomiting)   . Thyroid disease    Hyperactive then was treated with iodine.     Past Surgical History:  Procedure Laterality Date  . BREAST REDUCTION SURGERY  [id number]  . MUSCLE BIOPSY Right 07/15/2019   Procedure: MUSCLE BIOPSY RIGHT DELTOID AND RIGHT RECTUS FEMORIS MUSCLE;  Surgeon: Gaynelle AduWilson, Eric, MD;  [facility] OR;  Service: General;  Laterality: Right;       History of present illness and  Hospital Course:     Kindly see H&P for history of present illness and admission details, please review complete Labs, Consult reports and Test reports for all details in brief  HPI  from the history and physical done on the day of admission 07/13/2019  HPI: Andrea Delacruz is a 59 y.o. female with medical history significant of thyroid disease s/p radioactive ablation more than 30 years ago, obesity, hypertension and IIDM presented with increasing bilateral leg weakness of bilateral shoulders weakness  for 244-months. She is followed by Dr. Allena [facility]Patel [facility]. PCP Dr. Allena KatzPatel sent patient to ED to be further evaluated by neurology, muscle biopsy and significant hydration due to elevated CK and concern for myositis.  Started to feeling difficulty climbing up stairs since around November 2020, and gradually getting worse, now has to pull up legs to climb stairs, also much difficulty raising her arms above her shoulders. She denies any pain, cramps, dark-colored urine.  She said her symptoms were more severe in the late part of the day, denied any short of breath, no swallowing problems, no constipation.  Denied any double vision or blurry vision.  Denied any rash. ED Course: Neurology consultation required, who contacted on-call surgeon, decided to perform muscle biopsy tomorrow  Hospital Course    Proximal myopathy/nontraumatic rhabdomyolysis  -High clinical suspicion for myositis, with significantly elevated total CK on presentation,. -Muscle biopsy by general surgery 2/26.  Follow results outpatient with her primary neurologist -Discussed with neurology, will start on prednisone 60 mg oral daily, started on PPI, calcium supplement as well, and will start on Bactrim prophylaxis dose 1 tablet 3 times a week,  T/OT has been consulted, recommendation for CIR, but patient/husband declined, will arrange for home health PT/OT . -Significantly elevated total CK on presentation at 20 K, was aggressively hydrated during hospital stay, initially with bicarb, but this was transitioned to normal saline given bicarb of 32, level has improved top arounf  8300 on discharge .  He was encouraged to increase her fluid intake.  IIDM -Well-controlled with A1c of 7.2, but given she is on oral prednisone now anticipate CBG to increase, will add low-dose glipizide, and maximize her home dose Metformin to 1000 mg p.o. twice daily,  HTN Continue home meds.   Discharge Condition:  stable   Follow  UP  Follow-up Information    Juluis Rainier, MD Follow up in 1 week(s).   Specialty: Family Medicine Contact information: [address] [place] [address] [phone]        [facility].   Specialty: Home Health Services Why: they will call you in a few days to set up your first home appointment Contact information: [address] [place] [address] [phone]             Discharge Instructions  and  Discharge Medications    Discharge Instructions    Discharge instructions   Complete by: As directed    Follow with Primary MD Juluis Rainier, MD in 10 days   Get CBC, CMP, Total CK checked  by Primary MD next visit.    Activity: As tolerated with Full fall precautions use walker/cane & assistance as needed   Disposition Home    Diet: Carb Modified, increase your fluid intake   On your next visit with your primary care physician please Get Medicines reviewed and adjusted.   Please request your Prim.MD to go over all Hospital Tests and Procedure/Radiological results at the follow up, please get all Hospital records sent to your Prim MD by signing hospital release before you go home.   If you experience worsening of your admission symptoms, develop shortness of breath, life threatening emergency, suicidal or homicidal thoughts you must seek medical attention immediately by calling 911 or calling your MD immediately  if symptoms less severe.  You Must read complete instructions/literature along with all the possible adverse reactions/side effects for all the Medicines you take and that have been prescribed to you. Take any new Medicines after you have completely understood and accpet all the possible adverse reactions/side effects.   Do not drive, operating heavy machinery, perform activities at heights, swimming or participation in water activities or provide baby sitting services if your were admitted for syncope  or siezures until you have seen by Primary MD or a Neurologist and advised to do so again.  Do not drive when taking Pain medications.    Do not take more than prescribed Pain, Sleep and Anxiety Medications  Special Instructions: If you have smoked or chewed Tobacco  in the last 2 yrs please stop smoking, stop any regular Alcohol  and or any Recreational drug use.  Wear Seat belts while driving.   Please note  You were cared for by a hospitalist during your hospital stay. If you have any questions about your discharge medications or the care you received while you were in the hospital after you are discharged, you can call the unit and asked to speak with the hospitalist on call if the hospitalist that took care of you is not available. Once you are discharged, your primary care physician  will handle any further medical issues. Please note that NO REFILLS for any discharge medications will be authorized once you are discharged, as it is imperative that you return to your primary care physician (or establish a relationship with a primary care physician if you do not have one) for your aftercare needs so that they can reassess your need for medications and monitor your lab values.   Increase activity slowly   Complete by: As directed      Allergies as of 07/16/2019   No Known Allergies     Medication List    TAKE these medications   acetaminophen 500 MG tablet Commonly known as: TYLENOL Take 500 mg by mouth every 6 (six) hours as needed for mild pain or headache.   calcium carbonate 1250 (500 Ca) MG tablet Commonly known as: OS-CAL - dosed in mg of elemental calcium Take 1 tablet (500 mg of elemental calcium total) by mouth daily with breakfast. Start taking on: July 17, 2019   cholecalciferol 25 MCG (1000 UNIT) tablet Commonly known as: VITAMIN D3 Take 1,000 Units by mouth daily.   glipiZIDE 5 MG tablet Commonly known as: GLUCOTROL Take 0.5 tablets (2.5 mg total) by mouth 2  (two) times daily before a meal.   hydrochlorothiazide 25 MG tablet Commonly known as: HYDRODIURIL Take 25 mg by mouth daily.   lisinopril 10 MG tablet Commonly known as: ZESTRIL Take 10 mg by mouth daily.   metFORMIN 1000 MG tablet Commonly known as: GLUCOPHAGE Take 1 tablet (1,000 mg total) by mouth 2 (two) times daily with a meal. What changed:   medication strength  how much to take  when to take this  additional instructions   Myrbetriq 50 MG Tb24 tablet Generic drug: mirabegron ER Take 50 mg by mouth daily.   pantoprazole 40 MG tablet Commonly known as: PROTONIX Take 1 tablet (40 mg total) by mouth daily. Start taking on: July 17, 2019   predniSONE 20 MG tablet Commonly known as: DELTASONE Take 3 tablets (60 mg total) by mouth daily with breakfast. Start taking on: July 17, 2019   sulfamethoxazole-trimethoprim 800-160 MG tablet Commonly known as: BACTRIM DS Take 1 tablet by mouth every Monday, Wednesday, and Friday. Start taking on: July 18, 2019   Tirosint 150 MCG Caps Generic drug: Levothyroxine Sodium Take 1 capsule (150 mcg total) by mouth daily before breakfast.         Diet and Activity recommendation: See Discharge Instructions above   Consults obtained -  Neurology Genral surgery   Major procedures and Radiology Reports - PLEASE review detailed and final reports for all details, in brief -   PROCEDURE: Procedure(s): MUSCLE BIOPSY RIGHT DELTOID AND RIGHT RECTUS FEMORIS MUSCLE  SURGEON:Surgeon(s): Gaynelle Adu, MD 07/15/2019  NCV with EMG(electromyography)  Result Date: 07/12/2019 Glendale Chard, DO     07/12/2019  1:07 PM [facility] [address] [place], [address]  [place], [place] [address] Tel: [phone] Fax:  [phone] Test Date:  07/12/2019 Patient: Andrea Delacruz DOB: May 16, 1961 Physician: Nita Sickle, DO Sex: Female Height: ' 0" Ref Phys: Nita Sickle, DO ID#: [id number] Temp: 32.0C Technician:  Patient  Complaints: This is a 59 year old female with hyperCKemia and generalized weakness referred for evaluation of myopathy. NCV & EMG Findings: Extensive electrodiagnostic testing of the left upper and lower extremity shows: 1. Left median and sural sensory responses are within normal limits. 2. The left median, ulnar, and peroneal motor responses are within normal limits. 3. Diffuse small,  complex motor units are seen affecting the pronator teres, brachioradialis, biceps, triceps, deltoid, infraspinatus, gluteus medius, rectus femoris, iliacus, and medial gastrocnemius muscles with evidence of muscle membrane irritability. 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 ___________________________ Narda Amber, DO Nerve Conduction Studies Anti Sensory Summary Table  Site NR Peak (ms) Norm Peak (ms) P-T Amp (V) Norm P-T Amp Left Median Anti Sensory (2nd Digit)  32C Wrist    3.3 <3.6 41.3 >15 Left Sural Anti Sensory (Lat Mall)  32C Calf    2.7 <4.6 17.4 >4 Motor Summary Table  Site NR Onset (ms) Norm Onset (ms) O-P Amp (mV) Norm O-P Amp Site1 Site2 Delta-0 (ms) Dist (cm) Vel (m/s) Norm Vel (m/s) Left Median Motor (Abd Poll Brev)  32C Wrist    3.1 <4.0 8.8 >6 Elbow Wrist 4.7 28.0 60 >50 Elbow    7.8  8.8        Left Peroneal Motor (Ext Dig Brev)  32C Ankle    3.8 <6.0 5.2 >2.5 B Fib Ankle 6.8 35.0 51 >40 B Fib    10.6  4.6  Poplt B Fib 1.4 9.0 64 >40 Poplt    12.0  4.2        Left Peroneal TA Motor (Tib Ant)  32C Fib Head    2.5 <4.5 4.0 >3 Poplit Fib Head 1.4 9.0 64 >40 Poplit    3.9  4.0        Left Ulnar Motor (Abd Dig Minimi)  32C Wrist    2.7 <3.1 11.1 >7 B Elbow Wrist 3.9 23.0 59 >50 B Elbow    6.6  10.7  A Elbow B Elbow 2.0 10.0 50 >50 A Elbow    8.6  10.2        EMG  Side Muscle Ins Act Fibs Psw Fasc Number Recrt Dur Dur. Amp Amp. Poly Poly. Comment Right BicepsFemS Nml Nml Nml Nml Nml Nml Nml Nml Nml Nml Nml Nml N/A Left  1stDorInt Nml Nml Nml Nml Nml Nml Nml Nml Nml Nml Nml Nml N/A Left BrachioRad 1+ 1+ Nml Nml Nml Nml Some 1+ Some 1- Some 1+ E.R. Left FlexPolLong Nml Nml Nml Nml Nml Nml Nml Nml Nml Nml Nml Nml N/A Left PronatorTeres Nml Nml Nml Nml Nml Nml Few 1+ Few 1- Few 1+ N/A Left Biceps Nml 2+ Nml Nml Nml Nml Many 1+ Many 1- Many 1+ E.R. Left Triceps Nml 1+ Nml Nml Nml Nml Some 1+ Some 1- Some 1+ E.R. Left Deltoid Nml 1+ Nml Nml Nml Nml Many 1+ Many 1- Many 1+ E.R. Left Cervical Parasp Low Nml 1+ Nml Nml NE Nml - - - - - - N/A Left Infraspinatus 2+ 1+ Nml Nml Nml Nml Most 1+ Most 1- Most 1+ E.R. Left GluteusMed 2+ 1+ Nml Nml Nml Nml All 1+ All 1+ All 1+ E.R. Left AntTibialis Nml Nml Nml Nml Nml Nml Nml Nml Nml Nml Nml Nml N/A Left Gastroc Nml 1+ Nml Nml Nml Nml Few 1+ Few 1- Few 1+ N/A Left RectFemoris 2+ 1+ Nml Nml Nml Nml All 1+ All 1- All 1+ N/A Left Iliacus 1- Nml Nml Nml Nml Nml Most 1+ Most 1+ Most 1+ N/A Waveforms:          Micro Results     Recent Results (from the past 240 hour(s))  Respiratory Panel by RT PCR (Flu A&B, Covid) - Nasopharyngeal Swab     Status: None  Collection Time: 07/13/19  1:20 PM   Specimen: Nasopharyngeal Swab  Result Value Ref Range Status   SARS Coronavirus 2 by RT PCR NEGATIVE NEGATIVE Final    Comment: (NOTE) SARS-CoV-2 target nucleic acids are NOT DETECTED. The SARS-CoV-2 RNA is generally detectable in upper respiratoy specimens during the acute phase of infection. The lowest concentration of SARS-CoV-2 viral copies this assay can detect is 131 copies/mL. A negative result does not preclude SARS-Cov-2 infection and should not be used as the sole basis for treatment or other patient management decisions. A negative result may occur with  improper specimen collection/handling, submission of specimen other than nasopharyngeal swab, presence of viral mutation(s) within the areas targeted by this assay, and inadequate number of viral copies (<131 copies/mL). A negative  result must be combined with clinical observations, patient history, and epidemiological information. The expected result is Negative. Fact Sheet for Patients:  https://www.moore.com/ Fact Sheet for Healthcare Providers:  https://www.young.biz/ This test is not yet ap proved or cleared by the [place] FDA and  has been authorized for detection and/or diagnosis of SARS-CoV-2 by FDA under an Emergency Use Authorization (EUA). This EUA will remain  in effect (meaning this test can be used) for the duration of the COVID-19 declaration under Section 564(b)(1) of the Act, 21 U.S.C. section 360bbb-3(b)(1), unless the authorization is terminated or revoked sooner.    Influenza A by PCR NEGATIVE NEGATIVE Final   Influenza B by PCR NEGATIVE NEGATIVE Final    Comment: (NOTE) The Xpert Xpress SARS-CoV-2/FLU/RSV assay is intended as an aid in  the diagnosis of influenza from Nasopharyngeal swab specimens and  should not be used as a sole basis for treatment. Nasal washings and  aspirates are unacceptable for Xpert Xpress SARS-CoV-2/FLU/RSV  testing. Fact Sheet for Patients: https://www.moore.com/ Fact Sheet for Healthcare Providers: https://www.young.biz/ This test is not yet approved or cleared by the [place] FDA and  has been authorized for detection and/or diagnosis of SARS-CoV-2 by  FDA under an Emergency Use Authorization (EUA). This EUA will remain  in effect (meaning this test can be used) for the duration of the  Covid-19 declaration under Section 564(b)(1) of the Act, 21  U.S.C. section 360bbb-3(b)(1), unless the authorization is  terminated or revoked. Performed at [facility], [address], [place], [place] [address]        Today   Subjective:   Andrea Delacruz today has no headache,no chest abdominal pain, she denies any fever, chills, denies any worsening focal  deficits. Objective:   Blood pressure 121/73, pulse 90, temperature 98.4 F (36.9 C), temperature source Oral, resp. rate 16, height 5\' 7"  (1.702 m), weight 97.5 kg, SpO2 98 %.   Intake/Output Summary (Last 24 hours) at 07/16/2019 1710 Last data filed at 07/16/2019 1226 Gross per 24 hour  Intake 2960 ml  Output --  Net 2960 ml    Exam Awake Alert, Oriented x 3, No new F.N deficits, Normal affect.  Symmetrical Chest wall movement, Good air movement bilaterally, CTAB RRR,No Gallops,Rubs or new Murmurs, No Parasternal Heave +ve B.Sounds, Abd Soft, Non tender, No organomegaly appriciated, No rebound -guarding or rigidity. No Cyanosis, Clubbing or edema, No new Rash or bruise, site of surgical wound of biopsy right deltoid left upper thigh looks clean, no discharge or oozing  Data Review   CBC w Diff:  Lab Results  Component Value Date   WBC 10.5 07/16/2019   HGB 11.0 (L) 07/16/2019   HCT 35.6 (L) 07/16/2019  PLT 328 07/16/2019   LYMPHOPCT 41 07/13/2019   MONOPCT 8 07/13/2019   EOSPCT 2 07/13/2019   BASOPCT 1 07/13/2019    CMP:  Lab Results  Component Value Date   NA 136 07/16/2019   NA 138 07/08/2019   K 3.7 07/16/2019   CL 101 07/16/2019   CO2 26 07/16/2019   BUN 9 07/16/2019   BUN 6 07/08/2019   CREATININE 0.70 07/16/2019   GLU 96 03/01/2019   PROT 6.3 (L) 07/14/2019   PROT 7.2 07/08/2019   ALBUMIN 3.6 07/14/2019   ALBUMIN 4.5 07/08/2019   BILITOT 1.0 07/14/2019   BILITOT 0.5 07/08/2019   ALKPHOS 56 07/14/2019   AST 246 (H) 07/14/2019   ALT 273 (H) 07/14/2019  .   Total Time in preparing paper work, data evaluation and todays exam - 35 minutes  Huey Bienenstock M.D on 07/16/2019 at 5:10 PM  [facility]   Office  [phone]

## 2019-07-16 NOTE — Progress Notes (Signed)
Discharge education provided at bedside to pt and pt husband Pt IV removed, catheter intact Pt husband has all belongings  Pt discharged via wheelchair with NT

## 2019-07-16 NOTE — Plan of Care (Deleted)
NEUROLOGY PROGRESS NOTE  S:// No acute changes  O:// Unchanged neurological exam Mental Status: Alert, oriented, thought content appropriate. Speech fluent without evidence of aphasia. Able to follow 3 step commands without difficulty. Cranial Nerves: II: visual fields grossly normal, pupils equal, round, reactive to light and accommodation III,IV, VI: ptosis not present, extraocular muscles extra-ocular motions intact bilaterally V,VII: smile symmetric, facial light touch sensation normal bilaterally VIII: hearing normal bilaterally IX,X: gag reflex present XI: trapezius strength/neck flexion strength normal bilaterally XII: tongue strength normal  Motor: Right :Upper extremityLeft: Upper extremity 4/5 deltoid4/5 deltoid 5/5 tricep5/5 tricep 5/5 biceps5/5 biceps  5/5wrist flexion5/5 wrist flexion 5/5 wrist extension5/5 wrist extension 5/5 hand grip5/5 hand grip Lower extremityLower extremity 3/5 hip flexor3/5 hip flexor 5/5 hip adductors5/5 hip adductors 5/5 hip abductors5/5 hip abductors 5/5 quadricep5/5 quadriceps  4/5 hamstrings4/5 hamstrings 5/5 plantar flexion  5/5 plantar flexion 5/5 plantar extension5/5 plantar extension Tone and bulk:normal tone throughout; no atrophy noted Sensory: Pinprick and light touch intact throughout, bilaterally Deep Tendon Reflexes: Right: Upper Extremity Left: Upper extremity   biceps (C-5 to C-6) 2/4 biceps (C-5 to C-6) 2/4 tricep (C7) 2/4triceps (C7) 2/4 Brachioradialis (C6) 2/4Brachioradialis (C6) 2/4  Lower Extremity Lower Extremity  quadriceps (L-2 to L-4) 2/4 quadriceps (L-2 to L-4) 2/4 Achilles (S1)1/4Achilles (S1) 1/4 Cerebellar: normal finger-to-nose Gait: deferred   Labs CK-[id number] Glucose 151  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 Status post muscle biopsy-report pending. Outpatient myositis panel pending.  Recs: -Another bolus of NS and check CK again at 11 per Dr. Randol Kern -Prednisone 60 mg daily -Strict glucose management -PPI and calcium supplementation when on steroids -Bactrim thrice weekly -PT OT speech therapy-follow outpatient recommendations. -Follow-up with Dr. Allena Katz in 2 to 3 weeks-her office is aware -Follow-up with primary care in 1 week  Discussed in detail with the patient.  Answered all her questions. Discussed my plan with Dr. Randol Kern over the phone.  -- Milon Dikes, MD [facility] Pager: [phone] If 7pm to 7am, please call on call as listed on AMION.

## 2019-07-16 NOTE — Evaluation (Signed)
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  Pain is 5 on these areas.     Clinical Impression  Pt was seen with husband present for entire session x stairs in stairwell.  Her plan is to ask for CIR but will ask to go directly home despite the difference in therapy times.  Follow acutely through her stay to maximize her balance skills, refine her ability to manage stairs and work on standing endurance and quality of gait.    Follow Up Recommendations CIR    Equipment Recommendations  Rolling walker with 5" wheels    Recommendations for Other Services Rehab consult     Precautions / Restrictions Precautions Precautions: Fall Restrictions Weight Bearing Restrictions: No      Mobility  Bed Mobility Overal bed mobility: Needs Assistance Bed Mobility: Supine to Sit;Sit to Supine     Supine to sit: Min assist Sit to supine: Min guard;Min assist   General bed mobility comments: min to support edematous legs  Transfers Overall transfer level: Needs assistance Equipment used: Rolling walker (2 wheeled) Transfers: Sit to/from Stand Sit to Stand: Min assist         General transfer comment: min assist to power up  Ambulation/Gait Ambulation/Gait assistance: Min guard;+2 physical assistance;+2 safety/equipment Gait Distance (Feet): 180 Feet Assistive device: Rolling walker (2 wheeled) Gait Pattern/deviations: Step-through pattern;Wide base of support;Decreased stride length Gait velocity: reduced   General Gait Details: has limited abiltiy to manaage speed, slower pace  Stairs Stairs: Yes Stairs assistance: Min assist;Mod assist Stair Management: Two rails;Step to  pattern;Forwards;Backwards Number of Stairs: 4 General stair comments: pt tried leading up with RLE min assist but with LLE was mod assist,   Wheelchair Mobility    Modified Rankin (Stroke Patients Only)       Balance Overall balance assessment: Needs assistance Sitting-balance support: Feet supported Sitting balance-Leahy Scale: Good     Standing balance support: Bilateral upper extremity supported;During functional activity Standing balance-Leahy Scale: Fair Standing balance comment: less than fair dynamically                             Pertinent Vitals/Pain Pain Assessment: 0-10 Pain Score: 5  Pain Location: BIOPSY SITES IN R UE/LE Pain Descriptors / Indicators: Operative site guarding Pain Intervention(s): Limited activity within patient's tolerance;Monitored during session;Repositioned    Home Living Family/patient expects to be discharged to:: Private residence Living Arrangements: Children;Spouse/significant other Available Help at Discharge: Family Type of Home: House Home Access: Stairs to enter Entrance Stairs-Rails: None Entrance Stairs-Number of Steps: 3 Home Layout: Two level;Able to live on main level with bedroom/bathroom Home Equipment: Shower seat;Other (comment)(rails at commode)      Prior Function Level of Independence: Independent(has made do without an AD')         Comments: PATIENT HAS HAD INCREASED DIFFICULTY OVER THE PAST FEW MONTHS.      Hand Dominance   Dominant Hand: Right    Extremity/Trunk Assessment   Upper Extremity Assessment Upper Extremity Assessment: Defer to OT evaluation RUE Deficits / Details: R SHLD 90 SECONDARY TO PAIN FROM BIOPSY SITE.     Lower Extremity Assessment Lower Extremity Assessment: Overall  WFL for tasks assessed       Communication   Communication: No difficulties  Cognition Arousal/Alertness: Awake/alert Behavior During Therapy: WFL for tasks assessed/performed Overall Cognitive  Status: Within Functional Limits for tasks assessed                                        General Comments General comments (skin integrity, edema, etc.): Pt was assisted to stand and walk with RW, after initially seeing her in an extremely unsafe and very widened gait without an AD    Exercises     Assessment/Plan    PT Assessment Patient needs continued PT services  PT Problem List Decreased range of motion;Decreased activity tolerance;Decreased balance;Decreased coordination;Decreased knowledge of use of DME;Pain;Decreased skin integrity       PT Treatment Interventions Gait training;DME instruction;Stair training;Functional mobility training;Therapeutic activities;Therapeutic exercise;Balance training;Neuromuscular re-education;Patient/family education    PT Goals (Current goals can be found in the Care Plan section)  Acute Rehab PT Goals Patient Stated Goal: get stronger and safer PT Goal Formulation: With patient/family Time For Goal Achievement: 07/30/19 Potential to Achieve Goals: Good    Frequency Min 3X/week   Barriers to discharge Inaccessible home environment has stairs both to enter and go upstairs    Co-evaluation   Reason for Co-Treatment: For patient/therapist safety           AM-PAC PT "6 Clicks" Mobility  Outcome Measure Help needed turning from your back to your side while in a flat bed without using bedrails?: None Help needed moving from lying on your back to sitting on the side of a flat bed without using bedrails?: A Little Help needed moving to and from a bed to a chair (including a wheelchair)?: A Little Help needed standing up from a chair using your arms (e.g., wheelchair or bedside chair)?: A Little Help needed to walk in hospital room?: A Little Help needed climbing 3-5 steps with a railing? : A Little 6 Click Score: 19    End of Session Equipment Utilized During Treatment: Gait belt Activity Tolerance: Patient  tolerated treatment well;Patient limited by fatigue;Treatment limited secondary to medical complications (Comment) Patient left: in chair;with call bell/phone within reach;with family/visitor present Nurse Communication: Mobility status PT Visit Diagnosis: Unsteadiness on feet (R26.81);Other abnormalities of gait and mobility (R26.89);Other symptoms and signs involving the nervous system (Z32.992)    Time: 4268-3419 PT Time Calculation (min) (ACUTE ONLY): 30 min   Charges:   PT Evaluation $PT Eval Moderate Complexity: 1 Mod          Ivar Drape 07/16/2019, 1:18 PM  Samul Dada, PT MS Acute Rehab Dept. Number: [facility] [id number] and [facility] [phone]

## 2019-07-16 NOTE — Discharge Instructions (Signed)
Follow with Primary MD Juluis Rainier, MD in 10 days   Get CBC, CMP, Total CK checked  by Primary MD next visit.    Activity: As tolerated with Full fall precautions use walker/cane & assistance as needed   Disposition Home    Diet: Carb Modified, increase your fluid intake   On your next visit with your primary care physician please Get Medicines reviewed and adjusted.   Please request your Prim.MD to go over all Hospital Tests and Procedure/Radiological results at the follow up, please get all Hospital records sent to your Prim MD by signing hospital release before you go home.   If you experience worsening of your admission symptoms, develop shortness of breath, life threatening emergency, suicidal or homicidal thoughts you must seek medical attention immediately by calling 911 or calling your MD immediately  if symptoms less severe.  You Must read complete instructions/literature along with all the possible adverse reactions/side effects for all the Medicines you take and that have been prescribed to you. Take any new Medicines after you have completely understood and accpet all the possible adverse reactions/side effects.   Do not drive, operating heavy machinery, perform activities at heights, swimming or participation in water activities or provide baby sitting services if your were admitted for syncope or siezures until you have seen by Primary MD or a Neurologist and advised to do so again.  Do not drive when taking Pain medications.    Do not take more than prescribed Pain, Sleep and Anxiety Medications  Special Instructions: If you have smoked or chewed Tobacco  in the last 2 yrs please stop smoking, stop any regular Alcohol  and or any Recreational drug use.  Wear Seat belts while driving.   Please note  You were cared for by a hospitalist during your hospital stay. If you have any questions about your discharge medications or the care you received while you were  in the hospital after you are discharged, you can call the unit and asked to speak with the hospitalist on call if the hospitalist that took care of you is not available. Once you are discharged, your primary care physician will handle any further medical issues. Please note that NO REFILLS for any discharge medications will be authorized once you are discharged, as it is imperative that you return to your primary care physician (or establish a relationship with a primary care physician if you do not have one) for your aftercare needs so that they can reassess your need for medications and monitor your lab values.

## 2019-07-16 NOTE — TOC Transition Note (Signed)
Transition of Care [facility]) - CM/SW Discharge Note   Patient Details  Name: Andrea Delacruz MRN: [id number] Date of Birth: November 26, 1960  [facility] CM/SW Contact:  Lawerance Sabal, RN Phone Number: 07/16/2019, 1:05 PM   Clinical Narrative:    [name] w patient on the phone. Discusses HH options w her insurance. Agreed upon [facility]. Referral accepted. No DME needs.     Final next level of care: Home w Home Health Services Barriers to Discharge: No Barriers Identified   Patient Goals and CMS Choice Patient states their goals for this hospitalization and ongoing recovery are:: to go home CMS Medicare.gov Compare Post Acute Care list provided to:: Patient Choice offered to / list presented to : Patient  Discharge Placement                       Discharge Plan and Services                          HH Arranged: PT, OT [facility] Agency: [facility] Date [facility] Agency Contacted: 07/16/19 Time HH Agency Contacted: 1304 Representative spoke with at [facility] Agency: Kandee Keen  Social Determinants of Health (SDOH) Interventions     Readmission Risk Interventions No flowsheet data found.

## 2019-07-16 NOTE — Evaluation (Signed)
Occupational Therapy Evaluation Patient Details Name: Andrea Delacruz MRN: [id number] DOB: [birth year] Today's Date: 07/16/2019    History of Present Illness Pt. 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 Pt. PMH: DM, HTN.   Clinical Impression   PATIENT HAS DECREASED I AND SAFETY WITH ADLS AND ADL TRANSFERS. PATIENT IS REQUIRING ASSIST SECONDARY TO MUSCLE WEAKNESS. PATIENT LIVES AT HOME WITH HUSBAND WHO HAS BEEN ASSISTING. PATIENT WOULD BENEFIT FROM CONTINUED OT TO MAXIMIZE FUNCTION.     Follow Up Recommendations  CIR;Home health OT    Equipment Recommendations  None recommended by OT    Recommendations for Other Services Rehab consult     Precautions / Restrictions Precautions Precautions: Fall Restrictions Weight Bearing Restrictions: No      Mobility Bed Mobility Overal bed mobility: Needs Assistance Bed Mobility: Sit to Supine       Sit to supine: Min assist      Transfers Overall transfer level: Needs assistance   Transfers: Sit to/from Stand Sit to Stand: Min assist              Balance                                           ADL either performed or assessed with clinical judgement   ADL Overall ADL's : Needs assistance/impaired Eating/Feeding: Independent   Grooming: Min guard;Standing   Upper Body Bathing: Set up;Supervision/ safety   Lower Body Bathing: Minimal assistance   Upper Body Dressing : Minimal assistance   Lower Body Dressing: Minimal assistance   Toilet Transfer: Minimal assistance   Toileting- Clothing Manipulation and Hygiene: Min guard       Functional mobility during ADLs: Min guard General ADL Comments: PATIENT IS HAVING DIFFICULTY CROSSING LEGS AND BENDING OVER TO PERFORM LE ADLS.      Vision Baseline Vision/History: Wears glasses Wears Glasses: At all times Patient Visual Report: No change from baseline       Perception     Praxis      Pertinent Vitals/Pain  Pain Assessment: 0-10 Pain Score: 3  Pain Location: BIOPSY SITES IN R UE/LE Pain Descriptors / Indicators: Aching Pain Intervention(s): (PATIENT STATES IT ONLY HURTS WITH MOVEMENT. )     Hand Dominance Right   Extremity/Trunk Assessment Upper Extremity Assessment Upper Extremity Assessment: RUE deficits/detail RUE Deficits / Details: R SHLD 90 SECONDARY TO PAIN FROM BIOPSY SITE.            Communication Communication Communication: No difficulties   Cognition Arousal/Alertness: Awake/alert Behavior During Therapy: WFL for tasks assessed/performed Overall Cognitive Status: Within Functional Limits for tasks assessed                                     General Comments       Exercises     Shoulder Instructions      Home Living Family/patient expects to be discharged to:: Private residence Living Arrangements: Children;Spouse/significant other Available Help at Discharge: Family Type of Home: House Home Access: Stairs to enter Secretary/administrator of Steps: 3 Entrance Stairs-Rails: None Home Layout: Two level Alternate Level Stairs-Number of Steps: 15 Alternate Level Stairs-Rails: Left Bathroom Shower/Tub: Tub/shower unit;Walk-in shower   Bathroom Toilet: Handicapped height     Home Equipment: Shower seat;Other (comment)(toilet rails)  Prior Functioning/Environment Level of Independence: Independent with assistive device(s)        Comments: PATIENT HAS HAD INCREASED DIFFICULTY OVER THE PAST FEW MONTHS.         OT Problem List: Decreased range of motion;Decreased activity tolerance;Decreased knowledge of use of DME or AE      OT Treatment/Interventions: Self-care/ADL training;DME and/or AE instruction;Therapeutic activities;Patient/family education    OT Goals(Current goals can be found in the care plan section) Acute Rehab OT Goals Patient Stated Goal: GET STRONGER OT Goal Formulation: With patient Time For Goal  Achievement: 07/30/19 Potential to Achieve Goals: Good  OT Frequency: Min 2X/week   Barriers to D/C:            Co-evaluation PT/OT/SLP Co-Evaluation/Treatment: Yes Reason for Co-Treatment: For patient/therapist safety          AM-PAC OT "6 Clicks" Daily Activity     Outcome Measure Help from another person eating meals?: None Help from another person taking care of personal grooming?: A Little Help from another person toileting, which includes using toliet, bedpan, or urinal?: A Little Help from another person bathing (including washing, rinsing, drying)?: A Little Help from another person to put on and taking off regular upper body clothing?: A Little Help from another person to put on and taking off regular lower body clothing?: A Little 6 Click Score: 19   End of Session Equipment Utilized During Treatment: Gait belt;Rolling walker Nurse Communication: (OK THERAPY)  Activity Tolerance: (PATIENT LIMITED BY MUSCLE WEAKNESS) Patient left: in chair;with call bell/phone within reach;with family/visitor present  OT Visit Diagnosis: Other abnormalities of gait and mobility (R26.89);Muscle weakness (generalized) (M62.81)                Time: 1000-1035 OT Time Calculation (min): 35 min Charges:  OT General Charges $OT Visit: 1 Visit OT Evaluation $OT Eval Moderate Complexity: 1 Mod  6 CLICKS  Berma Harts 07/16/2019, 11:05 AM

## 2019-07-16 NOTE — Progress Notes (Signed)
Rehab Admissions Coordinator Note:  Per PT and OT recommendation, this patient was screened by Cheri Rous for appropriateness for an Inpatient Acute Rehab Consult.  At this time, we are recommending Inpatient Rehab consult. AC will place consult order per protocol.   Cheri Rous 07/16/2019, 1:39 PM  I can be reached at [phone].

## 2019-07-16 NOTE — Final Consult Note (Signed)
Consultant Final Sign-Off Note    Assessment/Final recommendations  Andrea Delacruz is a 59 y.o. female followed by me for muscle biopsies on 07/15/19   Wound care (if applicable):  Patient may shower over both incisions  Diet at discharge: per primary team   Activity at discharge: ad lib   Follow-up appointment:   No surgery follow-up needed  Pending results:  Unresulted Labs (From admission, onward)    Start     Ordered   07/13/19 1508  SARS CORONAVIRUS 2 (TAT 6-24 HRS) Nasopharyngeal Nasopharyngeal Swab  (Tier 3 (TAT 6-24 hrs))  Once,   R    Question Answer Comment  Is this test for diagnosis or screening Screening   Symptomatic for COVID-19 as defined by CDC No   Hospitalized for COVID-19 No   Admitted to ICU for COVID-19 No   Previously tested for COVID-19 Yes   Resident in a congregate (group) care setting No   Employed in healthcare setting No   Pregnant No      07/13/19 1508           Medication recommendations:   Other recommendations: Any discussion of muscle biopsy results needs to come from her neurologist   Thank you for allowing [place] to participate in the care of your patient!  Please consult [place] again if you have further needs for your patient.  Wilmon Arms [facility] 07/16/2019 8:19 AM    Subjective  Mildly sore, but otherwise no complaints   Objective  Vital signs in last 24 hours: Temp:  [97.7 F (36.5 C)-98.2 F (36.8 C)] 97.7 F (36.5 C) (02/27 0506) Pulse Rate:  [77-92] 82 (02/27 0808) Resp:  [13-21] 18 (02/27 0506) BP: (100-120)/(62-75) 114/66 (02/27 0808) SpO2:  [95 %-100 %] 98 % (02/27 0506)  General: WDWN in NAD Right shoulder incision c/d/i; no drainage; minimal swelling Right thigh incision c/d/i; no drainage; minimal swelling   Pertinent labs and Studies: Recent Labs    07/13/19 1020 07/14/19 0529 07/16/19 0426  WBC 4.8 5.3 10.5  HGB 12.0 11.4* 11.0*  HCT 38.6 36.0 35.6*   BMET Recent Labs    07/15/19 0608  07/16/19 0426  NA 139 136  K 3.5 3.7  CL 98 101  CO2 32 26  GLUCOSE 138* 151*  BUN 7 9  CREATININE 0.83 0.70  CALCIUM 9.7 9.3   No results for input(s): LABURIN in the last 72 hours. Results for orders placed or performed during the hospital encounter of 07/13/19  Respiratory Panel by RT PCR (Flu A&B, Covid) - Nasopharyngeal Swab     Status: None   Collection Time: 07/13/19  1:20 PM   Specimen: Nasopharyngeal Swab  Result Value Ref Range Status   SARS Coronavirus 2 by RT PCR NEGATIVE NEGATIVE Final    Comment: (NOTE) SARS-CoV-2 target nucleic acids are NOT DETECTED. The SARS-CoV-2 RNA is generally detectable in upper respiratoy specimens during the acute phase of infection. The lowest concentration of SARS-CoV-2 viral copies this assay can detect is 131 copies/mL. A negative result does not preclude SARS-Cov-2 infection and should not be used as the sole basis for treatment or other patient management decisions. A negative result may occur with  improper specimen collection/handling, submission of specimen other than nasopharyngeal swab, presence of viral mutation(s) within the areas targeted by this assay, and inadequate number of viral copies (<131 copies/mL). A negative result must be combined with clinical observations, patient history, and epidemiological information. The expected result is Negative. Fact Sheet for  Patients:  PinkCheek.be Fact Sheet for Healthcare Providers:  GravelBags.it This test is not yet ap proved or cleared by the [place] and  has been authorized for detection and/or diagnosis of SARS-CoV-2 by FDA under an Emergency Use Authorization (EUA). This EUA will remain  in effect (meaning this test can be used) for the duration of the COVID-19 declaration under Section 564(b)(1) of the Act, 21 U.S.C. section 360bbb-3(b)(1), unless the authorization is terminated or revoked sooner.     Influenza A by PCR NEGATIVE NEGATIVE Final   Influenza B by PCR NEGATIVE NEGATIVE Final    Comment: (NOTE) The Xpert Xpress SARS-CoV-2/FLU/RSV assay is intended as an aid in  the diagnosis of influenza from Nasopharyngeal swab specimens and  should not be used as a sole basis for treatment. Nasal washings and  aspirates are unacceptable for Xpert Xpress SARS-CoV-2/FLU/RSV  testing. Fact Sheet for Patients: PinkCheek.be Fact Sheet for Healthcare Providers: GravelBags.it This test is not yet approved or cleared by the [place] FDA and  has been authorized for detection and/or diagnosis of SARS-CoV-2 by  FDA under an Emergency Use Authorization (EUA). This EUA will remain  in effect (meaning this test can be used) for the duration of the  Covid-19 declaration under Section 564(b)(1) of the Act, 21  U.S.C. section 360bbb-3(b)(1), unless the authorization is  terminated or revoked. Performed at [facility], [place] [address], [place], [place] [address]     Imaging: No results found.

## 2019-07-16 NOTE — Plan of Care (Signed)
PATIENT HAS DECREASED I AND SAFETY WITH ADLS AND ADL TRANSFERS. PATIENT IS REQUIRING ASSIST SECONDARY TO MUSCLE WEAKNESS. PATIENT LIVES AT HOME WITH HUSBAND WHO HAS BEEN ASSISTING. PATIENT WOULD BENEFIT FROM CONTINUED OT TO MAXIMIZE FUNCTION.

## 2019-07-19 ENCOUNTER — Other Ambulatory Visit: Payer: BC Managed Care – PPO

## 2019-07-19 ENCOUNTER — Other Ambulatory Visit: Payer: Self-pay

## 2019-07-20 ENCOUNTER — Encounter: Payer: Self-pay | Admitting: Neurology

## 2019-07-20 ENCOUNTER — Other Ambulatory Visit (INDEPENDENT_AMBULATORY_CARE_PROVIDER_SITE_OTHER): Payer: BC Managed Care – PPO

## 2019-07-20 ENCOUNTER — Other Ambulatory Visit: Payer: Self-pay

## 2019-07-20 DIAGNOSIS — G729 Myopathy, unspecified: Secondary | ICD-10-CM | POA: Diagnosis not present

## 2019-07-20 LAB — CK: Total CK: 6051 U/L — ABNORMAL HIGH (ref 7–177)

## 2019-07-20 NOTE — Progress Notes (Signed)
Virtual Visit via Video Note The purpose of this virtual visit is to provide medical care while limiting exposure to the novel coronavirus.    Consent was obtained for video visit:  Yes.   Answered questions that patient had about telehealth interaction:  Yes.   I discussed the limitations, risks, security and privacy concerns of performing an evaluation and management service by telemedicine. I also discussed with the patient that there may be a patient responsible charge related to this service. The patient expressed understanding and agreed to proceed.  Pt location: Home Physician Location: office Name of referring provider:  Leighton Ruff, MD I connected with Andrea Delacruz at patients initiation/request on 07/21/2019 at  9:30 AM EST by video enabled telemedicine application and verified that I am speaking with the correct person using two identifiers. Pt MRN:  [id number] Pt DOB:  [birth year] Video Participants:  Andrea Delacruz   History of Present Illness: This is a 59 y.o. 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  Patient was admitted to [facility] for expedited evaluation of myopathy which was detected on EMG in the setting of markedly elevated CK (>19000).  She underwent right deltoid and right rectus femoris muscle biopsy, pathology pending.  She was subsequently started on prednisone 60 mg and adequately hydrated.  CKs have been trending down and was 6051 when checked yesterday.  She has noticed improvement in energy prior to being discharged and has noticed gradual improvement since then.  She feels that she is not waddling as much when walking. She has also noticed improve strength when climbing stairs, such that she can climb higher without stopping.  She is still climbing one-step at a time.  She is able to hold a milk gallon much easier, whereas previously she would need to hold it with hands.  Her blood sugars are 80-90 in the morning, but  night time readings are in the 200s.  Diabetes is followed by Dr. Dwyane Dee and I will update him on her current condition, as she will need closer monitoring.  Atorvastatin was stopped at her initial visit with me.   Observations/Objective:   Vitals:   07/20/19 0910  Weight: 222 lb (100.7 kg)  Height: '5\' 7"'$  (1.702 m)   Patient is awake, alert, and appears comfortable.  Oriented x 4.   Extraocular muscles are intact. No ptosis.  Face is symmetric.  Speech is not dysarthric.  Antigravity in all extremities.  She is able to raise each knee when standing.  No pronator drift. Gait appears mildly wide-based and mild trendelenburg, unassisted and stable.  DATA: Labs 07/04/2019:  CRP 2.7, ESR 11, myositis panel neg, MG panel neg  Component     Latest Ref Rng & Units 07/04/2019 07/13/2019 07/15/2019 07/16/2019            4:26 AM  CK Total     7 - 177 U/L 19,782 (H) 20,712 (H) 10,898 (H) 8,340 (H)   Component     Latest Ref Rng & Units 07/16/2019 07/20/2019        11:41 AM   CK Total     7 - 177 U/L 8,415 (H) 6,051 (H)   NCS/EMG of the left arm and leg 07/11/2018: The electrophysiologic findings are 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   Assessment and Plan:  1.  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  Despite inflammatory markers and myositis panel being negative, I  still have high clinical suspicion for immune-mediated process.  She has responded well with high dose prednisone and CK is trending down (peaked at [address] >> 6051) and clinically showing improved motor strength. I will follow-up with send-out myopathy panel which includes HMGCR antibody testing Continue prednisone '60mg'$  x 1 week, then reduce to '50mg'$  daily Check CK in 2 weeks Statins were discontinued and will continue to hold until HMGCR antibody has resulted  2.  Chronic steroid use Continue bactrim, calcium, and protonix  She will need close  monitoring of blood sugars as she will need to be on chronic steroids.  I will keep Dr. Dwyane Dee updated in her care and will attempt to wean steroids to limit side effects  Follow Up Instructions:   I discussed the assessment and treatment plan with the patient. The patient was provided an opportunity to ask questions and all were answered. The patient agreed with the plan and demonstrated an understanding of the instructions.   The patient was advised to call back or seek an in-person evaluation if the symptoms worsen or if the condition fails to improve as anticipated.  Follow-up in 1 month   [place], DO

## 2019-07-21 ENCOUNTER — Telehealth (INDEPENDENT_AMBULATORY_CARE_PROVIDER_SITE_OTHER): Payer: BC Managed Care – PPO | Admitting: Neurology

## 2019-07-21 ENCOUNTER — Other Ambulatory Visit: Payer: Self-pay

## 2019-07-21 VITALS — Ht 67.0 in | Wt 222.0 lb

## 2019-07-21 DIAGNOSIS — Z7952 Long term (current) use of systemic steroids: Secondary | ICD-10-CM | POA: Diagnosis not present

## 2019-07-21 DIAGNOSIS — G729 Myopathy, unspecified: Secondary | ICD-10-CM

## 2019-07-21 NOTE — Progress Notes (Signed)
Order placed and appointment scheduled  

## 2019-07-25 ENCOUNTER — Telehealth: Payer: Self-pay | Admitting: Neurology

## 2019-07-25 LAB — SURGICAL PATHOLOGY

## 2019-07-25 NOTE — Telephone Encounter (Signed)
Demographic information sent. 

## 2019-07-25 NOTE — Telephone Encounter (Signed)
Andrea Delacruz is needing this patients demographics faxed to her at [phone]. Thank you

## 2019-07-25 NOTE — Telephone Encounter (Signed)
Left message for Kriste Basque to return call to get info she needed.

## 2019-07-25 NOTE — Telephone Encounter (Signed)
Demographic information sent.

## 2019-07-25 NOTE — Telephone Encounter (Signed)
Becky from Neuro [facility] @ [facility] left msg with after hours about having missing patient info for the orders. Call back # is [phone] option 1. Or you can fax info to [phone]. Thanks!

## 2019-07-26 ENCOUNTER — Telehealth: Payer: Self-pay

## 2019-07-26 DIAGNOSIS — G7249 Other inflammatory and immune myopathies, not elsewhere classified: Secondary | ICD-10-CM

## 2019-07-26 NOTE — Telephone Encounter (Signed)
Called patient and asked her to stay on prednisone 60mg  until she seems me again later this month.  Clinically, she denies any new weakness.  She is staying well-hydrated. 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  I will refer her to rheumatology for further management.

## 2019-07-26 NOTE — Telephone Encounter (Signed)
Pt has already sent a Mychart message asking this and this has been sent to the MD to answer.

## 2019-07-26 NOTE — Telephone Encounter (Signed)
Patient calling today to find out if she will need to have TSH drawn before her next appointment - if she will need this done a new signed order will need to be placed

## 2019-07-27 ENCOUNTER — Other Ambulatory Visit: Payer: Self-pay

## 2019-07-27 ENCOUNTER — Encounter: Payer: BC Managed Care – PPO | Attending: Endocrinology | Admitting: Nutrition

## 2019-07-27 ENCOUNTER — Ambulatory Visit: Payer: BC Managed Care – PPO | Admitting: Endocrinology

## 2019-07-27 ENCOUNTER — Telehealth: Payer: Self-pay | Admitting: Neurology

## 2019-07-27 ENCOUNTER — Encounter: Payer: Self-pay | Admitting: Endocrinology

## 2019-07-27 VITALS — BP 140/70 | HR 105 | Ht 67.0 in | Wt 213.6 lb

## 2019-07-27 DIAGNOSIS — T380X5A Adverse effect of glucocorticoids and synthetic analogues, initial encounter: Secondary | ICD-10-CM | POA: Diagnosis not present

## 2019-07-27 DIAGNOSIS — E1165 Type 2 diabetes mellitus with hyperglycemia: Secondary | ICD-10-CM

## 2019-07-27 DIAGNOSIS — E890 Postprocedural hypothyroidism: Secondary | ICD-10-CM

## 2019-07-27 DIAGNOSIS — R739 Hyperglycemia, unspecified: Secondary | ICD-10-CM | POA: Diagnosis not present

## 2019-07-27 DIAGNOSIS — E119 Type 2 diabetes mellitus without complications: Secondary | ICD-10-CM | POA: Insufficient documentation

## 2019-07-27 LAB — TSH: TSH: 4.32 u[IU]/mL (ref 0.35–4.50)

## 2019-07-27 LAB — T4, FREE: Free T4: 1.43 ng/dL (ref 0.60–1.60)

## 2019-07-27 MED ORDER — INSULIN PEN NEEDLE 31G X 5 MM MISC: 1 refills | Status: DC

## 2019-07-27 MED ORDER — NOVOLOG MIX 70/30 FLEXPEN (70-30) 100 UNIT/ML ~~LOC~~ SUPN: PEN_INJECTOR | SUBCUTANEOUS | 0 refills | Status: DC

## 2019-07-27 MED ORDER — FREESTYLE LIBRE 2 SENSOR MISC: 2.0000 | 3 refills | Status: DC

## 2019-07-27 MED ORDER — RYBELSUS 3 MG PO TABS: 1.0000 | ORAL_TABLET | Freq: Every day | ORAL | 0 refills | Status: DC

## 2019-07-27 NOTE — Progress Notes (Signed)
Patient ID: Andrea Delacruz, female   DOB: [birth year], 59 y.o.   MRN: [id number]           Reason for Appointment: Type II Diabetes follow-up   History of Present Illness   Diagnosis date:  2018  Previous history:  There may have had prediabetes for some time before she was told to have diabetes and started on Metformin No details of her history are available She thinks her blood sugars were relatively well controlled with Metformin 500 mg twice daily.  However her last 2 A1c levels have been 7.2 and 7.3 and she has been followed by her PCP  Recent history:     Non-insulin hypoglycemic drugs: Metformin 1 g twice daily, glipizide 2.5 mg twice daily     Insulin regimen: None           Side effects from medications:  Diarrhea from high dose metformin  Current self management, blood sugar patterns and problems identified:  . She was concerned that her blood sugars are going up with starting prednisone during her hospitalization on 2/24 . Although she has been told to increase her Metformin to 1000 mg and started on low-dose glipizide her blood sugars are consistently over 200 in the afternoons and evenings and as high as 348 . Blood sugar seem to go up after breakfast at times but mostly in the afternoon . She said that she feels foggy in her head when her blood sugars go up and more thirsty . She is currently on 60 mg prednisone and is going to continue for 3 weeks . Prior to her problems with myositis she was trying to walk regularly  Exercise: Unable to do much recently Diet management: Usually eating cereal or oatmeal in the morning      Monitors blood glucose:  Several times a day.    Glucometer: One Touch.           Blood Glucose readings from meter download:   PRE-MEAL Fasting Lunch Dinner Bedtime Overall  Glucose range:  81-105  95-252  160-306  87-348   Mean/median:  112   240  181  169   POST-MEAL PC Breakfast PC Lunch PC Dinner  Glucose range:  99-239  162-309     Mean/median:        Hypoglycemia:  none                        Dietician visit: Most recent: None     Weight control:  Wt Readings from Last 3 Encounters:  07/27/19 213 lb 9.6 oz (96.9 kg)  07/20/19 222 lb (100.7 kg)  07/16/19 215 lb (97.5 kg)             Diabetes labs:  Lab Results  Component Value Date   HGBA1C 7.2 (H) 07/14/2019   HGBA1C 7.3 (H) 11/08/2018   Lab Results  Component Value Date   CREATININE 0.70 07/16/2019     Allergies as of 07/27/2019   No Known Allergies     Medication List       Accurate as of July 27, 2019  3:18 PM. If you have any questions, ask your nurse or doctor.        acetaminophen 500 MG tablet Commonly known as: TYLENOL Take 500 mg by mouth every 6 (six) hours as needed for mild pain or headache.   calcium carbonate 1250 (500 Ca) MG tablet Commonly known as: OS-CAL - dosed in mg of  elemental calcium Take 1 tablet (500 mg of elemental calcium total) by mouth daily with breakfast.   cholecalciferol 25 MCG (1000 UNIT) tablet Commonly known as: VITAMIN D3 Take 1,000 Units by mouth daily.   FreeStyle Libre 2 Sensor Misc 2 Devices by Does not apply route every 14 (fourteen) days. Started by: Reather Littler, MD   glipiZIDE 5 MG tablet Commonly known as: GLUCOTROL Take 0.5 tablets (2.5 mg total) by mouth 2 (two) times daily before a meal.   hydrochlorothiazide 25 MG tablet Commonly known as: HYDRODIURIL Take 25 mg by mouth daily.   Insulin Pen Needle 31G X 5 MM Misc Used to inject insulin Started by: Reather Littler, MD   lisinopril 10 MG tablet Commonly known as: ZESTRIL Take 10 mg by mouth daily.   metFORMIN 1000 MG tablet Commonly known as: GLUCOPHAGE Take 1 tablet (1,000 mg total) by mouth 2 (two) times daily with a meal.   Myrbetriq 50 MG Tb24 tablet Generic drug: mirabegron ER Take 50 mg by mouth daily.   NovoLOG Mix 70/30 FlexPen (70-30) 100 UNIT/ML FlexPen Generic drug: insulin aspart protamine - aspart Up to  30 units before lunch as directed Started by: Reather Littler, MD   pantoprazole 40 MG tablet Commonly known as: PROTONIX Take 1 tablet (40 mg total) by mouth daily.   predniSONE 20 MG tablet Commonly known as: DELTASONE Take 3 tablets (60 mg total) by mouth daily with breakfast.   Rybelsus 3 MG Tabs Generic drug: Semaglutide Take 1 tablet by mouth daily. Take 1 tablet (3mg  total) by mouth once daily.   sulfamethoxazole-trimethoprim 800-160 MG tablet Commonly known as: BACTRIM DS Take 1 tablet by mouth every Monday, Wednesday, and Friday.   Tirosint 150 MCG Caps Generic drug: Levothyroxine Sodium Take 1 capsule (150 mcg total) by mouth daily before breakfast.       Allergies: No Known Allergies  Past Medical History:  Diagnosis Date  . Complication of anesthesia   . Diabetes mellitus without complication (HCC)   . Hypertension   . Obesity   . PONV (postoperative nausea and vomiting)   . Thyroid disease    Hyperactive then was treated with iodine.     Past Surgical History:  Procedure Laterality Date  . BREAST REDUCTION SURGERY  Sunday  . MUSCLE BIOPSY Right 07/15/2019   Procedure: MUSCLE BIOPSY RIGHT DELTOID AND RIGHT RECTUS FEMORIS MUSCLE;  Surgeon: 07/17/2019, MD;  Location: [facility] OR;  Service: General;  Laterality: Right;    Family History  Problem Relation Age of Onset  . Stroke Mother   . Diabetes Mother   . Diabetes Father   . Diabetes Sister   . Hypertension Sister   . Hypertension Maternal Aunt   . Cancer Maternal Uncle   . Diabetes Paternal Aunt   . Diabetes Paternal Uncle   . Cancer Maternal Grandmother     Social History:  reports that she has never smoked. She has never used smokeless tobacco. She reports current alcohol use. She reports that she does not use drugs.  Review of Systems  Cardiovascular: Positive for palpitations.  Gastrointestinal: Positive for diarrhea.  Neurological: Positive for weakness.   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  Latest known visit with results is:  Orders Only on 07/20/2019  Component Date Value Ref Range Status  . Total CK 07/20/2019 6,051* 7 - 177 U/L Final     Examination:   BP 140/70 (BP Location: Left Arm, Patient Position: Sitting, Cuff Size: Normal)   Pulse (!) 105   Ht 5\' 7"  (1.702 m)   Wt 213 lb 9.6 oz (96.9 kg)   SpO2 97%   BMI 33.45 kg/m   Body mass index is 33.45 kg/m.    ASSESSMENT/ PLAN:    Diabetes type 2 with morbid obesity:   Blood glucose control is much worse with her being on prednisone for her myositis Blood sugars have been as high as 348 and she does appear to have some symptoms from hyperglycemia  Also previous level of control was inadequate with 500 mg Metformin alone Also had difficulty losing weight previously consistently  Discussed that with being on steroids high doses and likely needing to continue this for several weeks she will be better controlled with insulin that also will work more quickly However she will also benefit from using a GLP-1 drug which will also benefit her long-term with better control, glucose stabilization, weight loss and cardiovascular risk reduction  Discussed with the patient the nature of GLP-1 drugs, the actions on insulin secretion, slowing stomach emptying, reduction of appetite and reduced liver glucose production Explained that Rybelsus improves blood sugar control as well as produces weight loss and reduces cardiovascular  events. Explained possible side effects especially nausea and vomiting that may initially; usually side effects improved with time.  Patient to call if nausea or vomiting does not improve within 2 weeks Have explained the need to take the capsules on empty stomach 30 minutes before breakfast daily.  Patient education pamphlet on GLP-1 drugs given  She will benefit from premixed insulin which will be simpler than basal bolus and mostly needs to cover hypoglycemia in the afternoons and evenings Discussed differences between short and long-acting insulins Timing of administration discussed  INSULIN instructions were given and she will start with NovoLog mix at lunchtime and increase the dose based on blood sugars at dinnertime Insulin administration instructions were given by nurse educator Also will start the freestyle libre for more convenient and frequent glucose monitoring while on insulin  She can stop glipizide and reduce her Metformin to half tablet twice a day as it is causing her diarrhea   HYPOTHYROIDISM: She will be also evaluated for thyroid dysfunction on Tirosint 150 mcg and thyroid levels to be drawn today She can check on the co-pay card online and let us know if she has difficulty getting this reportedly   Follow-up in 3 weeks  Patient Instructions  PREMIXED insulin insulin: This insulin provides blood sugar control right after lunch and up to 12 to 14 hours subsequently.   Start with 10 units just before lunch daily and increase by 2 units every 2 days until the blood sugar BEFORE DINNER is under 140  Then continue the same dose.  If blood sugar is under 90 at dinnertime for 2 days in a row, reduce the dose by 2 units.   REDUCE Metformin to half tablet twice a day Stop glipizide  Make sure you have a protein like an  egg or high protein yogurt or lean meat at breakfast instead of cereal    Reather Littler 07/27/2019, 3:18 PM

## 2019-07-27 NOTE — Telephone Encounter (Signed)
Muscle biopsy results of the right rectus femoris  07/16/2019:  There is fiber necrosis and phagocytosis.  There is interstitial and some perivascular infiltrates.  Large vessels appears grossly normal.  Small to medial sized vessels show some perivascular infiltrates and in at least one area they seem to be embedding the vessel wall.  There is no fibrinoid necrosis or occlusion.  There is fiber atrophy and type II fiber predominance.  Special staining notable for no ragged red fibers, but there are at least three small rimmed vacuoles.    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  Because of patients age, inclusion body myositis should be considered.

## 2019-07-27 NOTE — Patient Instructions (Signed)
PREMIXED insulin insulin: This insulin provides blood sugar control right after lunch and up to 12 to 14 hours subsequently.   Start with 10 units just before lunch daily and increase by 2 units every 2 days until the blood sugar BEFORE DINNER is under 140  Then continue the same dose.  If blood sugar is under 90 at dinnertime for 2 days in a row, reduce the dose by 2 units.   REDUCE Metformin to half tablet twice a day Stop glipizide  Make sure you have a protein like an egg or high protein yogurt or lean meat at breakfast instead of cereal

## 2019-07-27 NOTE — Addendum Note (Signed)
Addended by: Judd Gaudier on: 07/27/2019 07:37 AM   Modules accepted: Orders

## 2019-07-27 NOTE — Telephone Encounter (Signed)
Referral placed and information faxed.

## 2019-07-28 ENCOUNTER — Encounter (HOSPITAL_COMMUNITY): Payer: Self-pay

## 2019-07-28 ENCOUNTER — Other Ambulatory Visit: Payer: BC Managed Care – PPO

## 2019-08-01 ENCOUNTER — Ambulatory Visit: Payer: BC Managed Care – PPO | Admitting: Endocrinology

## 2019-08-02 ENCOUNTER — Telehealth: Payer: Self-pay | Admitting: Nutrition

## 2019-08-02 NOTE — Telephone Encounter (Signed)
Called pt and gave her MD dosing instruction changed for insulin. Pt verbalized understanding and was scheduled in an acute slot on 08/10/2019 at 0930 and has an appt with CDE the same day at 11am.

## 2019-08-02 NOTE — Telephone Encounter (Signed)
I phoned patient to see how she was doing with taking her insulin injection.  She reports that the first day after taking the 10u, the blood sugar was high all day--in the 250s-300.  She has since increased the number of injections to 3 tiimes a day.  Yesterday she took 10u acL (11AM), then 10 more units because she was "still high" at 5PM, and then again at 8:30PM.  Says blood sugar was over 250 at 8:30.   FBS was 110 this AM.   Please advise.

## 2019-08-02 NOTE — Progress Notes (Addendum)
Patient was shown how to use the insulin pen.  We discussed how to store, attach the needle, how to dial up the dose of 10u, where/how to inject.  She re demonstrated how to attach the pen and dialed the dose to 10u correctly. We also discussed low blood sugars--signs and symptoms, and treatments.  She reported good understanding of this and had no final questions. She was also shown how to use a Libre sensor. A sample was given to her.  Lot number: [id number] Exp. 7/21. She inserted on into her left arm, and started the receiver timer.  We discussed the difference between sensor readings and blood sugar readings, and when to test blood sugars, and she reported good understanding of this.

## 2019-08-02 NOTE — Telephone Encounter (Signed)
She should stay with the insulin before breakfast and dinnertime and increase the morning dose to 18 units and continue 10 units at dinner. Andrea Delacruz please see if I can see her early next week instead of next month for follow-up

## 2019-08-02 NOTE — Patient Instructions (Addendum)
Take 10u 10 min. Before lunch each day. Increase dose by 2u after 2 days if blood sugar readings are before supper.are less than 140.

## 2019-08-05 LAB — HM DIABETES EYE EXAM

## 2019-08-10 ENCOUNTER — Ambulatory Visit: Payer: BC Managed Care – PPO | Admitting: Endocrinology

## 2019-08-10 ENCOUNTER — Encounter: Payer: Self-pay | Admitting: Endocrinology

## 2019-08-10 ENCOUNTER — Encounter: Payer: BC Managed Care – PPO | Admitting: Nutrition

## 2019-08-10 ENCOUNTER — Other Ambulatory Visit: Payer: Self-pay

## 2019-08-10 VITALS — BP 130/80 | HR 88 | Ht 67.0 in | Wt 212.4 lb

## 2019-08-10 DIAGNOSIS — R739 Hyperglycemia, unspecified: Secondary | ICD-10-CM

## 2019-08-10 DIAGNOSIS — E1169 Type 2 diabetes mellitus with other specified complication: Secondary | ICD-10-CM | POA: Diagnosis not present

## 2019-08-10 DIAGNOSIS — E890 Postprocedural hypothyroidism: Secondary | ICD-10-CM | POA: Diagnosis not present

## 2019-08-10 DIAGNOSIS — E1165 Type 2 diabetes mellitus with hyperglycemia: Secondary | ICD-10-CM

## 2019-08-10 DIAGNOSIS — T380X5A Adverse effect of glucocorticoids and synthetic analogues, initial encounter: Secondary | ICD-10-CM

## 2019-08-10 DIAGNOSIS — E119 Type 2 diabetes mellitus without complications: Secondary | ICD-10-CM

## 2019-08-10 MED ORDER — RYBELSUS 7 MG PO TABS: 1.0000 | ORAL_TABLET | Freq: Every day | ORAL | 3 refills | Status: DC

## 2019-08-10 NOTE — Progress Notes (Signed)
Patient ID: Andrea Delacruz, female   DOB: 03/20/1961, 59 y.o.   MRN: [id number]           Reason for Appointment: Type II Diabetes follow-up   History of Present Illness   Diagnosis date:  2018  Previous history:  There may have had prediabetes for some time before she was told to have diabetes and started on Metformin No details of her history are available She thinks her blood sugars were relatively well controlled with Metformin 500 mg twice daily.  However her last 2 A1c levels have been 7.2 and 7.3 and she has been followed by her PCP  Recent history:     Non-insulin hypoglycemic drugs: Metformin 1 g daily, Rybelsus 3 mg daily      Insulin regimen:   NovoLog mix, 18 units before breakfast and 10 units before dinner          Side effects from medications:  Diarrhea from high dose metformin  Current self management, blood sugar patterns and problems identified:  . She was started on insulin because of significant hyperglycemia on prednisone with blood sugars as high as 348 . She was also having diarrhea with Metformin and this was reduced to 1 tablet daily instead of 2000 mg a day . Glipizide was replaced with RYBELSUS . She is taking this in the morning with her thyroid medication and has no side effects from this . Her insulin dose appears to be very adequate as her blood sugars still tend to go up despite increasing her dose since her last visit . She is only recently starting to take the prednisone in split doses using 40 mg in the morning and 20 mg in the evening . Blood sugars appear to be progressively higher from morning to evening but much better overnight . She thinks she is watching her diet overall but is going to see the dietitian today for more information . She does sometimes get into eating sweets like cookies including last night  Exercise: Unable to do much currently       CONTINUOUS GLUCOSE MONITORING RECORD INTERPRETATION    Dates of Recording: Last 2  weeks  Sensor description: Josephine Igo version 2  Results statistics:   CGM use % of time  96  Average and SD  159  Time in range    70    %  % Time Above 180  24  % Time above 250  6  % Time Below target 0    PRE-MEAL Fasting Lunch Dinner Bedtime Overall  Glucose range:       Mean/median:  108  169  198   159   POST-MEAL PC Breakfast PC Lunch PC Dinner  Glucose range:     Mean/median:  148  184  192    Glycemic patterns summary: Blood sugars appear to be near normal at 6 AM and then progressively going up until about 9 PM and moderate variability also  Hyperglycemic episodes.  Variably throughout the day with highest blood sugar usually after about 3 PM  Hypoglycemic episodes occurred transiently overnight rarely mid afternoon  Overnight periods: Blood sugars are usually decreasing progressively after about midnight to the lowest level around 6-7 AM, only rare low normal sugars  Preprandial periods: Blood sugars are mildly increased in the morning but on the rise compared to the lowest readings around 7 AM Blood sugars are moderately increased at lunchtime as above and overall higher at dinnertime and averaging nearly 200  Postprandial periods:   After breakfast: Overall blood sugars are rising only modestly on an average After lunch:   Usually not having any significant blood sugar spikes and occasionally lower After dinner: Glucose readings are generally flat to lower but on occasion with higher depending on her diet  Previous readings:   PRE-MEAL Fasting Lunch Dinner Bedtime Overall  Glucose range:  81-105  95-252  160-306  87-348   Mean/median:  112   240  181  169   POST-MEAL PC Breakfast PC Lunch PC Dinner  Glucose range:  99-239  162-309   Mean/median:        Hypoglycemia:  none                        Dietician visit: Most recent: None     Weight control:  Wt Readings from Last 3 Encounters:  08/10/19 212 lb 6.4 oz (96.3 kg)  07/27/19 213 lb 9.6 oz (96.9  kg)  07/20/19 222 lb (100.7 kg)             Diabetes labs:  Lab Results  Component Value Date   HGBA1C 7.2 (H) 07/14/2019   HGBA1C 7.3 (H) 11/08/2018   Lab Results  Component Value Date   CREATININE 0.70 07/16/2019     Allergies as of 08/10/2019   No Known Allergies     Medication List       Accurate as of August 10, 2019  2:25 PM. If you have any questions, ask your nurse or doctor.        STOP taking these medications   glipiZIDE 5 MG tablet Commonly known as: GLUCOTROL Stopped by: Reather Littler, MD     TAKE these medications   acetaminophen 500 MG tablet Commonly known as: TYLENOL Take 500 mg by mouth every 6 (six) hours as needed for mild pain or headache.   calcium carbonate 1250 (500 Ca) MG tablet Commonly known as: OS-CAL - dosed in mg of elemental calcium Take 1 tablet (500 mg of elemental calcium total) by mouth daily with breakfast.   cholecalciferol 25 MCG (1000 UNIT) tablet Commonly known as: VITAMIN D3 Take 1,000 Units by mouth daily.   FreeStyle Libre 2 Sensor Misc 2 Devices by Does not apply route every 14 (fourteen) days.   hydrochlorothiazide 25 MG tablet Commonly known as: HYDRODIURIL Take 25 mg by mouth daily.   Insulin Pen Needle 31G X 5 MM Misc Used to inject insulin   lisinopril 10 MG tablet Commonly known as: ZESTRIL Take 10 mg by mouth daily.   metFORMIN 1000 MG tablet Commonly known as: GLUCOPHAGE Take 1,000 mg by mouth daily. What changed: Another medication with the same name was removed. Continue taking this medication, and follow the directions you see here. Changed by: Reather Littler, MD   Myrbetriq 50 MG Tb24 tablet Generic drug: mirabegron ER Take 50 mg by mouth daily.   NovoLOG Mix 70/30 FlexPen (70-30) 100 UNIT/ML FlexPen Generic drug: insulin aspart protamine - aspart Inject into the skin 2 (two) times daily. Inject 14 units under the skin before breakfast 10 at lunch, and 10 before dinner. What changed: Another  medication with the same name was removed. Continue taking this medication, and follow the directions you see here. Changed by: Reather Littler, MD   pantoprazole 40 MG tablet Commonly known as: PROTONIX Take 1 tablet (40 mg total) by mouth daily.   predniSONE 20 MG tablet Commonly known as: DELTASONE Take 3 tablets (  60 mg total) by mouth daily with breakfast.   Rybelsus 3 MG Tabs Generic drug: Semaglutide Take 2 tablets by mouth daily. Take 2 tablets by mouth once daily. What changed: Another medication with the same name was removed. Continue taking this medication, and follow the directions you see here. Changed by: [name], MD   sulfamethoxazole-trimethoprim 800-160 MG tablet Commonly known as: BACTRIM DS Take 1 tablet by mouth every Monday, Wednesday, and Friday.   Tirosint 150 MCG Caps Generic drug: Levothyroxine Sodium Take 1 capsule (150 mcg total) by mouth daily before breakfast.       Allergies: No Known Allergies  Past Medical History:  Diagnosis Date  . Complication of anesthesia   . Diabetes mellitus without complication ([place])   . Hypertension   . Obesity   . PONV (postoperative nausea and vomiting)   . Thyroid disease    Hyperactive then was treated with iodine.     Past Surgical History:  Procedure Laterality Date  . BREAST REDUCTION SURGERY  [id number]  . MUSCLE BIOPSY Right 07/15/2019   Procedure: MUSCLE BIOPSY RIGHT DELTOID AND RIGHT RECTUS FEMORIS MUSCLE;  Surgeon: Greer Pickerel, MD;  Location: [facility];  Service: General;  Laterality: Right;    Family History  Problem Relation Age of Onset  . Stroke Mother   . Diabetes Mother   . Diabetes Father   . Diabetes Sister   . Hypertension Sister   . Hypertension Maternal Aunt   . Cancer Maternal Uncle   . Diabetes Paternal Aunt   . Diabetes Paternal Uncle   . Cancer Maternal Grandmother     Social History:  reports that she has never smoked. She has never used smokeless tobacco. She reports current  alcohol use. She reports that she does not use drugs.  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 HM Diabetic Eye Exam 08/05/2019 No Retinopathy  No Retinopathy Final     Examination:   BP 130/80 (BP Location: Right Arm, Patient Position: Sitting, Cuff Size: Normal)   Pulse 88   Ht 5\' 7"  (1.702 m)   Wt 212 lb 6.4 oz (96.3 kg)   SpO2 97%   BMI 33.27 kg/m   Body mass index is 33.27 kg/m.    ASSESSMENT/ PLAN:    Diabetes type 2 with morbid obesity:    Blood glucose control is improving with starting insulin while she is on her 60 mg prednisone dose  As above her blood sugars are averaging 159 on her sensor for the last 2 weeks but frequently over 200 after lunch and around dinnertime Overnight readings are fairly good and rarely low normal with her suppertime dose of 10 units of NovoLog mix  She also can do better with diet and is  going to talk to the nutritionist today Exercise ability is limited now She will also benefit from Rybelsus but is currently only on 3 mg for the last 2 weeks  Recommendations:  Increase Rybelsus to 6 mg with her 3 mg prescription and then go to 7 mg daily if tolerated  She will split her morning insulin dose and take 14 units about 30-minute before breakfast  and 10 units before lunch, and to continue 10 units at dinnertime  Follow-up blood sugars are consistently high  Stay on current dose of Metformin 1 g in the morning  Start walking as much is able to  Recheck A1c on the next visit  Moderate portions of higher carbohydrate and higher fat meals and sweets  HYPOTHYROIDISM: Better controlled with Tirosint instead of Synthroid She will continue Tirosint 150 mcg Given her information on the co-pay card She will have repeat TSH on the next visit Follow-up in 6 weeks     Patient Instructions  Take 14 units at about 9.30 am  Take 10 units before lunch and 10 at supper  Take 2 of 3mg  Rybelsus daily    08/10/2019, 2:25 PM

## 2019-08-10 NOTE — Patient Instructions (Addendum)
Take 14 units at about 9.30 am  Take 10 units before lunch and 10 at supper  Take 2 of 3mg  Rybelsus daily

## 2019-08-12 ENCOUNTER — Ambulatory Visit: Payer: BC Managed Care – PPO | Admitting: Neurology

## 2019-08-12 NOTE — Patient Instructions (Signed)
Review instructions before next sensor replacement. Call Freestyle 800 number if needing assistance.

## 2019-08-12 NOTE — Progress Notes (Signed)
Patient called earlier today requesting some more assistance with putting in her next LIbre sensor which was due to be changed today.   She came in and had already removed the paper lid from the sensor, and loaded the sensor.  She said she had done this earlier today, and was told that she should do this at the time of insertion.  She reported good understanding of this.   She then cleaned off the site, and attached the sensor to her left arm, and started the timer.   She reports that she feels that she can do this on her own from now on.  She had no final questions.

## 2019-08-16 ENCOUNTER — Other Ambulatory Visit: Payer: Self-pay

## 2019-08-16 DIAGNOSIS — E1165 Type 2 diabetes mellitus with hyperglycemia: Secondary | ICD-10-CM

## 2019-08-16 MED ORDER — METFORMIN HCL 1000 MG PO TABS: 1000.0000 mg | ORAL_TABLET | Freq: Every day | ORAL | 0 refills | Status: DC

## 2019-08-18 ENCOUNTER — Encounter (HOSPITAL_COMMUNITY): Payer: Self-pay

## 2019-08-22 ENCOUNTER — Ambulatory Visit: Payer: BC Managed Care – PPO | Admitting: Endocrinology

## 2019-08-29 ENCOUNTER — Other Ambulatory Visit: Payer: Self-pay

## 2019-08-29 ENCOUNTER — Other Ambulatory Visit: Payer: Self-pay | Admitting: Endocrinology

## 2019-08-29 MED ORDER — METFORMIN HCL ER 500 MG PO TB24: 2000.0000 mg | ORAL_TABLET | Freq: Every day | ORAL | 3 refills | Status: DC

## 2019-08-29 MED ORDER — FREESTYLE LIBRE 2 SENSOR MISC: 2.0000 | 3 refills | Status: DC

## 2019-09-05 ENCOUNTER — Other Ambulatory Visit: Payer: Self-pay | Admitting: Family Medicine

## 2019-09-05 DIAGNOSIS — Z1231 Encounter for screening mammogram for malignant neoplasm of breast: Secondary | ICD-10-CM

## 2019-09-07 ENCOUNTER — Other Ambulatory Visit: Payer: Self-pay

## 2019-09-07 MED ORDER — NOVOLOG MIX 70/30 FLEXPEN (70-30) 100 UNIT/ML ~~LOC~~ SUPN: PEN_INJECTOR | SUBCUTANEOUS | 1 refills | Status: DC

## 2019-09-15 ENCOUNTER — Other Ambulatory Visit (INDEPENDENT_AMBULATORY_CARE_PROVIDER_SITE_OTHER): Payer: BC Managed Care – PPO

## 2019-09-15 ENCOUNTER — Other Ambulatory Visit: Payer: Self-pay

## 2019-09-15 ENCOUNTER — Other Ambulatory Visit: Payer: BC Managed Care – PPO

## 2019-09-15 DIAGNOSIS — E1165 Type 2 diabetes mellitus with hyperglycemia: Secondary | ICD-10-CM | POA: Diagnosis not present

## 2019-09-15 DIAGNOSIS — E890 Postprocedural hypothyroidism: Secondary | ICD-10-CM

## 2019-09-15 LAB — BASIC METABOLIC PANEL
BUN: 11 mg/dL (ref 6–23)
CO2: 35 mEq/L — ABNORMAL HIGH (ref 19–32)
Calcium: 9.9 mg/dL (ref 8.4–10.5)
Chloride: 100 mEq/L (ref 96–112)
Creatinine, Ser: 0.67 mg/dL (ref 0.40–1.20)
GFR: 108.66 mL/min (ref >60.00)
Glucose, Bld: 114 mg/dL — ABNORMAL HIGH (ref 70–99)
Potassium: 3.3 mEq/L — ABNORMAL LOW (ref 3.5–5.1)
Sodium: 140 mEq/L (ref 135–145)

## 2019-09-15 LAB — TSH: TSH: 19.44 u[IU]/mL — ABNORMAL HIGH (ref 0.35–4.50)

## 2019-09-15 LAB — T4, FREE: Free T4: 1.13 ng/dL (ref 0.60–1.60)

## 2019-09-16 LAB — FRUCTOSAMINE: Fructosamine: 262 umol/L (ref 0–285)

## 2019-09-17 ENCOUNTER — Other Ambulatory Visit: Payer: Self-pay | Admitting: Endocrinology

## 2019-09-19 NOTE — Progress Notes (Signed)
Patient ID: Andrea Delacruz, female   DOB: [birth year], 59 y.o.   MRN: [id number]           Reason for Appointment: Endocrinology follow-up  History of Present Illness   Diagnosis date:  2018  Previous history:  There may have had prediabetes for some time before she was told to have diabetes and started on Metformin No details of her history are available She thinks her blood sugars were relatively well controlled with Metformin 500 mg twice daily.  However her last 2 A1c levels have been 7.2 and 7.3 and she has been followed by her PCP  Recent history:     Non-insulin hypoglycemic drugs: Metformin 1 g daily, Rybelsus 7 mg daily      Insulin regimen:   NovoLog mix 70/30, 20 units before each meal    Side effects from medications:  Diarrhea from high dose metformin  Current self management, blood sugar patterns and problems identified:  . She has been able to continue RYBELSUS and is now taking 7 mg daily . She is taking this in the morning before breakfast and has no nausea . However she does have a decreased appetite with her prednisone . Currently taking 80 mg daily . Since increasing the prednisone her insulin dose has been increased also . She has gained 6 pounds . Although she is generally trying to avoid high-fat meals sometimes will have higher sugars randomly either after breakfast or dinnertime . Today blood sugar was over 200 after eating cereal without protein . Overall however her blood sugars are mostly averaging between 130-160 throughout the day with highest readings after 8 PM . She has a few readings in the 60s before breakfast and dinnertime but usually not symptomatic  Exercise: Unable to do much    Data using the freestyle libre version 2:      CGM use % of time  97  2-week average/SD  146, GV 28  Time in range    80    %  % Time Above 180  18  % Time above 250   % Time Below 70 1     PRE-MEAL Fasting Lunch Dinner Bedtime Overall  Glucose range:        Averages:  127  148  143  164    POST-MEAL PC Breakfast PC Lunch PC Dinner  Glucose range:     Averages:  152  141  170    Previous data:    CGM use % of time  96  Average and SD  159  Time in range    70    %  % Time Above 180  24  % Time above 250  6  % Time Below target 0    PRE-MEAL Fasting Lunch Dinner Bedtime Overall  Glucose range:       Mean/median:  108  169  198   159   POST-MEAL PC Breakfast PC Lunch PC Dinner  Glucose range:     Mean/median:  148  184  192                          Dietician visit: Most recent: None     Weight control:  Wt Readings from Last 3 Encounters:  09/20/19 218 lb 3.2 oz (99 kg)  08/10/19 212 lb 6.4 oz (96.3 kg)  07/27/19 213 lb 9.6 oz (96.9 kg)  Diabetes labs:  Lab Results  Component Value Date   HGBA1C 7.2 (H) 07/14/2019   HGBA1C 7.3 (H) 11/08/2018   Lab Results  Component Value Date   CREATININE 0.67 09/15/2019     Allergies as of 09/20/2019   No Known Allergies     Medication List       Accurate as of Sep 20, 2019  9:35 AM. If you have any questions, ask your nurse or doctor.        STOP taking these medications   pantoprazole 40 MG tablet Commonly known as: PROTONIX Stopped by: [name], MD   predniSONE 20 MG tablet Commonly known as: DELTASONE Stopped by: [name], MD   sulfamethoxazole-trimethoprim 800-160 MG tablet Commonly known as: BACTRIM DS Stopped by: [name], MD     TAKE these medications   acetaminophen 500 MG tablet Commonly known as: TYLENOL Take 500 mg by mouth every 6 (six) hours as needed for mild pain or headache.   calcium carbonate 1250 (500 Ca) MG tablet Commonly known as: OS-CAL - dosed in mg of elemental calcium Take 1 tablet (500 mg of elemental calcium total) by mouth daily with breakfast.   cholecalciferol 25 MCG (1000 UNIT) tablet Commonly known as: VITAMIN D3 Take 1,000 Units by mouth daily.   FreeStyle Libre 2 Sensor Misc 2 Devices by  Does not apply route every 14 (fourteen) days.   hydrochlorothiazide 25 MG tablet Commonly known as: HYDRODIURIL Take 25 mg by mouth daily.   Insulin Pen Needle 31G X 5 MM Misc Used to inject insulin   lisinopril 10 MG tablet Commonly known as: ZESTRIL Take 10 mg by mouth daily.   metFORMIN 500 MG 24 hr tablet Commonly known as: GLUCOPHAGE-XR Take 4 tablets (2,000 mg total) by mouth daily with supper.   Myrbetriq 50 MG Tb24 tablet Generic drug: mirabegron ER Take 50 mg by mouth daily. What changed: Another medication with the same name was removed. Continue taking this medication, and follow the directions you see here. Changed by: [name], MD   NovoLOG Mix 70/30 FlexPen (70-30) 100 UNIT/ML FlexPen Generic drug: insulin aspart protamine - aspart Inject 20 units under the skin three times daily before meals.   Rybelsus 7 MG Tabs Generic drug: Semaglutide Take 1 tablet by mouth daily before breakfast. Take 30 minutes before breakfast with water   Tirosint 150 MCG Caps Generic drug: Levothyroxine Sodium TAKE 1 CAPSULE (150 MCG TOTAL) BY MOUTH DAILY BEFORE BREAKFAST.       Allergies: No Known Allergies  Past Medical History:  Diagnosis Date  . Complication of anesthesia   . Diabetes mellitus without complication ([facility])   . Hypertension   . Obesity   . PONV (postoperative nausea and vomiting)   . Thyroid disease    Hyperactive then was treated with iodine.     Past Surgical History:  Procedure Laterality Date  . BREAST REDUCTION SURGERY  [id number]  . MUSCLE BIOPSY Right 07/15/2019   Procedure: MUSCLE BIOPSY RIGHT DELTOID AND RIGHT RECTUS FEMORIS MUSCLE;  Surgeon: Greer Pickerel, MD;  Location: [facility];  Service: General;  Laterality: Right;    Family History  Problem Relation Age of Onset  . Stroke Mother   . Diabetes Mother   . Diabetes Father   . Diabetes Sister   . Hypertension Sister   . Hypertension Maternal Aunt   . Cancer Maternal Uncle   . Diabetes  Paternal Aunt   . Diabetes Paternal Uncle   . Cancer  Maternal Grandmother     Social History:  reports that she has never smoked. She has never used smokeless tobacco. She reports current alcohol use. She reports that she does not use drugs.  ROS She has post ablative hypothyroidism and in the last month is taking Tirosint 150 mcg daily instead of levothyroxine Previously with increasing levothyroxine her TSH continued to increase and had gone up to 23 Although her TSH had come back to normal at 4.3 with the same dose and no change in compliance her TSH is now up to 19.4  She does feel tired but has other issues going on  Lab Results  Component Value Date   TSH 19.44 (H) 09/15/2019   TSH 4.32 07/27/2019   TSH 23.25 (H) 06/21/2019   FREET4 1.13 09/15/2019   FREET4 1.43 07/27/2019   FREET4 0.63 06/21/2019   She has inflammatory myositis, 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 Free T4 09/15/2019 1.13  0.60 - 1.60 ng/dL Final   Comment: Specimens from patients who are undergoing biotin therapy and /or ingesting biotin supplements may contain high levels of biotin.  The higher biotin concentration in these specimens interferes with this Free T4 assay.  Specimens that contain high levels  of biotin may cause false high results for this Free T4 assay.  Please interpret results in light of the total clinical presentation of the patient.    Marland Kitchen TSH 09/15/2019 19.44* 0.35 - 4.50 uIU/mL Final  . Fructosamine 09/15/2019 262  0 - 285 umol/L Final   Comment: Published reference interval for apparently healthy subjects between age 21 and 16 is 64 - 285 umol/L and in a poorly controlled diabetic population is 228 - 563 umol/L with a mean of 396 umol/L.   Marland Kitchen Sodium 09/15/2019 140  135 - 145 mEq/L Final  . Potassium 09/15/2019 3.3* 3.5 - 5.1 mEq/L Final   . Chloride 09/15/2019 100  96 - 112 mEq/L Final  . CO2 09/15/2019 35* 19 - 32 mEq/L Final  . Glucose, Bld 09/15/2019 114* 70 - 99 mg/dL Final  . BUN 81/44/8185 11  6 - 23 mg/dL Final  . Creatinine, Ser 09/15/2019 0.67  0.40 - 1.20 mg/dL Final  . GFR 63/14/9702 108.66  >60.00 mL/min Final  . Calcium 09/15/2019 9.9  8.4 - 10.5 mg/dL Final     Examination:   BP (!) 126/96 (BP Location: Left Arm, Patient Position: Sitting, Cuff Size: Large)   Pulse 100   Ht 5\' 7"  (1.702 m)   Wt 218 lb 3.2 oz (99 kg)   SpO2 92%   BMI 34.17 kg/m   Body mass index is 34.17 kg/m.    ASSESSMENT/ PLAN:    Diabetes type 2 with morbid obesity:   She is on premixed insulin 3 times daily and Rybelsus 7 mg now along with 1 g Metformin  Fructosamine is 262, last A1c was 7.2  She has less frequent hypoglycemia after meals May be benefiting from increasing Rybelsus However certain meals are also making her blood sugar go up either in the evening and occasionally in the morning such as with cereal Not able to exercise much Her weight has gone up   Recommendations:  Increase Rybelsus to 2 tablets now and she will make sure she is tolerating this before calling for a 14 mg prescription, this may  improve her satiety better especially with continuing high-dose prednisone  Reduce suppertime dose of insulin to 18 units to prevent low normal sugars overnight  Add protein to breakfast consistently  She will try to increase exercise as tolerated  Continue to use freestyle libre version 2, this appears to be fairly accurate and she is checking her blood sugars throughout the day  A1c on the next visit  HYPOTHYROIDISM: This was initially better controlled with Tirosint instead of Synthroid with about the same doses However without any change in her compliance her TSH is higher again at 19  She will continue Tirosint but go up to 175 mcg, may consider higher dose also but will check in 6 weeks first  Given  her information on the direct purchase program also in case this is less expensive Continue to take the Tirosint on empty stomach in the morning  Start potassium supplements for hypokalemia  Follow-up in 6 weeks     There are no Patient Instructions on file for this visit.   Reather Littler 09/20/2019, 9:35 AM

## 2019-09-20 ENCOUNTER — Other Ambulatory Visit: Payer: Self-pay

## 2019-09-20 ENCOUNTER — Ambulatory Visit: Payer: BC Managed Care – PPO | Admitting: Endocrinology

## 2019-09-20 ENCOUNTER — Encounter: Payer: Self-pay | Admitting: Endocrinology

## 2019-09-20 VITALS — BP 126/96 | HR 100 | Ht 67.0 in | Wt 218.2 lb

## 2019-09-20 DIAGNOSIS — Z794 Long term (current) use of insulin: Secondary | ICD-10-CM | POA: Diagnosis not present

## 2019-09-20 DIAGNOSIS — R635 Abnormal weight gain: Secondary | ICD-10-CM

## 2019-09-20 DIAGNOSIS — E1165 Type 2 diabetes mellitus with hyperglycemia: Secondary | ICD-10-CM

## 2019-09-20 DIAGNOSIS — E890 Postprocedural hypothyroidism: Secondary | ICD-10-CM

## 2019-09-20 MED ORDER — TIROSINT 175 MCG PO CAPS: 175.0000 ug | ORAL_CAPSULE | Freq: Every day | ORAL | 2 refills | Status: DC

## 2019-09-20 MED ORDER — POTASSIUM CHLORIDE CRYS ER 20 MEQ PO TBCR: 20.0000 meq | EXTENDED_RELEASE_TABLET | Freq: Every day | ORAL | 3 refills | Status: DC

## 2019-09-20 NOTE — Patient Instructions (Signed)
Take 18 u at supper  Avoid cereal

## 2019-09-23 ENCOUNTER — Other Ambulatory Visit: Payer: Self-pay

## 2019-09-23 MED ORDER — INSULIN PEN NEEDLE 31G X 5 MM MISC: 1 refills | Status: DC

## 2019-09-27 ENCOUNTER — Other Ambulatory Visit: Payer: Self-pay

## 2019-09-27 MED ORDER — FREESTYLE LIBRE 2 SENSOR MISC: 2.0000 | 3 refills | Status: DC

## 2019-10-07 ENCOUNTER — Other Ambulatory Visit: Payer: Self-pay | Admitting: Endocrinology

## 2019-10-07 ENCOUNTER — Ambulatory Visit: Payer: BC Managed Care – PPO

## 2019-10-07 MED ORDER — FREESTYLE PRECISION NEO TEST VI STRP: ORAL_STRIP | 1 refills | Status: DC

## 2019-10-14 ENCOUNTER — Ambulatory Visit
Admission: RE | Admit: 2019-10-14 | Discharge: 2019-10-14 | Disposition: A | Discharge status: Home / Self Care | Payer: BC Managed Care – PPO | Source: Ambulatory Visit | Attending: Family Medicine | Admitting: Family Medicine

## 2019-10-14 ENCOUNTER — Other Ambulatory Visit: Payer: Self-pay

## 2019-10-14 DIAGNOSIS — Z1231 Encounter for screening mammogram for malignant neoplasm of breast: Secondary | ICD-10-CM

## 2019-11-01 ENCOUNTER — Other Ambulatory Visit (INDEPENDENT_AMBULATORY_CARE_PROVIDER_SITE_OTHER): Payer: BC Managed Care – PPO

## 2019-11-01 ENCOUNTER — Other Ambulatory Visit: Payer: Self-pay

## 2019-11-01 DIAGNOSIS — Z794 Long term (current) use of insulin: Secondary | ICD-10-CM

## 2019-11-01 DIAGNOSIS — E1165 Type 2 diabetes mellitus with hyperglycemia: Secondary | ICD-10-CM

## 2019-11-01 DIAGNOSIS — E890 Postprocedural hypothyroidism: Secondary | ICD-10-CM

## 2019-11-01 LAB — BASIC METABOLIC PANEL
BUN: 11 mg/dL (ref 6–23)
CO2: 31 mEq/L (ref 19–32)
Calcium: 9.6 mg/dL (ref 8.4–10.5)
Chloride: 103 mEq/L (ref 96–112)
Creatinine, Ser: 0.62 mg/dL (ref 0.40–1.20)
GFR: 118.78 mL/min (ref >60.00)
Glucose, Bld: 103 mg/dL — ABNORMAL HIGH (ref 70–99)
Potassium: 3.5 mEq/L (ref 3.5–5.1)
Sodium: 138 mEq/L (ref 135–145)

## 2019-11-01 LAB — T4, FREE: Free T4: 1.3 ng/dL (ref 0.60–1.60)

## 2019-11-01 LAB — HEMOGLOBIN A1C: Hgb A1c MFr Bld: 8.5 % — ABNORMAL HIGH (ref 4.6–6.5)

## 2019-11-01 LAB — TSH: TSH: 3.8 u[IU]/mL (ref 0.35–4.50)

## 2019-11-03 ENCOUNTER — Other Ambulatory Visit: Payer: Self-pay

## 2019-11-03 ENCOUNTER — Ambulatory Visit: Payer: BC Managed Care – PPO | Admitting: Endocrinology

## 2019-11-03 ENCOUNTER — Encounter: Payer: Self-pay | Admitting: Endocrinology

## 2019-11-03 VITALS — BP 118/78 | HR 100 | Ht 67.0 in | Wt 219.8 lb

## 2019-11-03 DIAGNOSIS — E876 Hypokalemia: Secondary | ICD-10-CM | POA: Diagnosis not present

## 2019-11-03 DIAGNOSIS — E1165 Type 2 diabetes mellitus with hyperglycemia: Secondary | ICD-10-CM | POA: Diagnosis not present

## 2019-11-03 DIAGNOSIS — E890 Postprocedural hypothyroidism: Secondary | ICD-10-CM

## 2019-11-03 NOTE — Progress Notes (Signed)
Patient ID: Andrea Delacruz, female   DOB: 1960/08/20, 59 y.o.   MRN: [id number]           Reason for Appointment: Endocrinology follow-up  History of Present Illness   Diagnosis date:  2018  Previous history:  There may have had prediabetes for some time before she was told to have diabetes and started on Metformin No details of her history are available She thinks her blood sugars were relatively well controlled with Metformin 500 mg twice daily.  However her last 2 A1c levels have been 7.2 and 7.3 and she has been followed by her PCP  Recent history:     Non-insulin hypoglycemic drugs: Metformin 1 g daily, Rybelsus 7 mg daily      Insulin regimen:   NovoLog mix 70/30, 15 units before each meal    Side effects from medications:  Diarrhea from high dose metformin  Current diabetes management, blood sugar patterns and problems identified:  Her A1c is higher than expected at 8.5 compared to 7.2  . She has been able to reduce her insulin in the last several days . With her starting to taper her prednisone she is apparently needing less insulin . Usually not taking insulin at breakfast time especially since blood sugars are near normal and did not appear to be going up midday . She takes her prednisone at breakfast again . No side effects with RYBELSUS and is still taking 7 mg every morning on empty stomach . Has been able to be a little more active also . She has a few readings in the 60s midday or afternoon and once after bedtime but usually not symptomatic  Exercise: some walking recently   CONTINUOUS GLUCOSE MONITORING RECORD INTERPRETATION    Dates of Recording: Last 2 weeks  Sensor description: freestyle libre version 2:       Results statistics:   CGM use % of time  97  Average and SD  131  Time in range     89   %  % Time Above 180  10  % Time above 250   % Time Below target  1    PRE-MEAL Fasting Lunch Dinner Bedtime Overall  Glucose range:         Mean/median:  113   153   131   POST-MEAL PC Breakfast PC Lunch PC Dinner  Glucose range:     Mean/median:  120  127 148    Glycemic patterns summary: Blood sugars are very steady throughout the day with some tendency to high readings between 4-6 PM which is before dinner  Hyperglycemic episodes are occurring mostly in the mid to late afternoon after 4 PM possibly related to snacks  Hypoglycemic episodes occurred infrequently about a week ago mid to late afternoon and once after midnight. However most of the low sugars are in the mid to upper 60s except on 6/8. Not having as many low sugars in the last few days  Overnight periods: Baseline variability but most of the blood sugars are consistently within the target range although slightly higher at midnight  Preprandial periods: Glucose readings are mostly higher at dinnertime otherwise near normal at breakfast and lunch  Postprandial periods:   After breakfast:   Blood sugars are very consistently near normal after reading After lunch: No significant hypoglycemia although not clear what time she is eating a meal Afternoon rise in blood sugars may be related to prednisone or snacks  After dinner: Blood sugars are  usually relatively stable or slightly lower   Previous data:  CGM use % of time  97  2-week average/SD  146, GV 28  Time in range    80    %  % Time Above 180  18  % Time above 250   % Time Below 70 1     PRE-MEAL Fasting Lunch Dinner Bedtime Overall  Glucose range:       Averages:  127  148  143  164    POST-MEAL PC Breakfast PC Lunch PC Dinner  Glucose range:     Averages:  152  141  170              Dietician visit: Most recent: None     Weight control:  Wt Readings from Last 3 Encounters:  11/03/19 219 lb 12.8 oz (99.7 kg)  09/20/19 218 lb 3.2 oz (99 kg)  08/10/19 212 lb 6.4 oz (96.3 kg)             Diabetes labs:  Lab Results  Component Value Date   HGBA1C 8.5 (H) 11/01/2019   HGBA1C 7.2  (H) 07/14/2019   HGBA1C 7.3 (H) 11/08/2018   Lab Results  Component Value Date   CREATININE 0.62 11/01/2019     Allergies as of 11/03/2019   No Known Allergies     Medication List       Accurate as of November 03, 2019  8:57 AM. If you have any questions, ask your nurse or doctor.        acetaminophen 500 MG tablet Commonly known as: TYLENOL Take 500 mg by mouth every 6 (six) hours as needed for mild pain or headache.   calcium carbonate 1250 (500 Ca) MG tablet Commonly known as: OS-CAL - dosed in mg of elemental calcium Take 1 tablet (500 mg of elemental calcium total) by mouth daily with breakfast.   cholecalciferol 25 MCG (1000 UNIT) tablet Commonly known as: VITAMIN D3 Take 1,000 Units by mouth daily.   FreeStyle Libre 2 Sensor Misc 2 Devices by Does not apply route every 14 (fourteen) days.   FreeStyle Precision Neo Test test strip Generic drug: glucose blood Use as instructed to check blood sugar using freestyle libre reader   hydrochlorothiazide 25 MG tablet Commonly known as: HYDRODIURIL Take 25 mg by mouth daily.   Insulin Pen Needle 31G X 5 MM Misc Used to inject insulin   lisinopril 10 MG tablet Commonly known as: ZESTRIL Take 10 mg by mouth daily.   metFORMIN 500 MG 24 hr tablet Commonly known as: GLUCOPHAGE-XR Take 4 tablets (2,000 mg total) by mouth daily with supper.   Myrbetriq 50 MG Tb24 tablet Generic drug: mirabegron ER Take 50 mg by mouth daily.   NovoLOG Mix 70/30 FlexPen (70-30) 100 UNIT/ML FlexPen Generic drug: insulin aspart protamine - aspart Inject 20 units under the skin three times daily before meals.   potassium chloride SA 20 MEQ tablet Commonly known as: KLOR-CON Take 1 tablet (20 mEq total) by mouth daily.   predniSONE 20 MG tablet Commonly known as: DELTASONE Take 80 mg by mouth daily with breakfast. Take 4 tablets (80mg  total) by mouth once daily at breakfast.   Rybelsus 7 MG Tabs Generic drug: Semaglutide Take 1  tablet by mouth daily before breakfast. Take 30 minutes before breakfast with water   Tirosint 175 MCG Caps Generic drug: Levothyroxine Sodium Take 175 mcg by mouth daily before breakfast.       Allergies: No  Known Allergies  Past Medical History:  Diagnosis Date  . Complication of anesthesia   . Diabetes mellitus without complication (HCC)   . Hypertension   . Obesity   . PONV (postoperative nausea and vomiting)   . Thyroid disease    Hyperactive then was treated with iodine.     Past Surgical History:  Procedure Laterality Date  . BREAST REDUCTION SURGERY  [id number]  . MUSCLE BIOPSY Right 07/15/2019   Procedure: MUSCLE BIOPSY RIGHT DELTOID AND RIGHT RECTUS FEMORIS MUSCLE;  Surgeon: Gaynelle Adu, MD;  Location: [facility];  Service: General;  Laterality: Right;    Family History  Problem Relation Age of Onset  . Stroke Mother   . Diabetes Mother   . Diabetes Father   . Diabetes Sister   . Hypertension Sister   . Hypertension Maternal Aunt   . Cancer Maternal Uncle   . Diabetes Paternal Aunt   . Diabetes Paternal Uncle   . Cancer Maternal Grandmother     Social History:  reports that she has never smoked. She has never used smokeless tobacco. She reports current alcohol use. She reports that she does not use drugs.  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 Free T4 11/01/2019 1.30  0.60 - 1.60 ng/dL Final   Comment: Specimens from patients who are undergoing biotin therapy and /or ingesting biotin supplements may contain high levels of biotin.  The higher biotin concentration in these specimens interferes with this Free T4 assay.  Specimens that contain high levels  of biotin may cause false high results for this Free T4 assay.  Please interpret results in light of the total clinical presentation of the patient.    Marland Kitchen TSH 11/01/2019 3.80  0.35 - 4.50 uIU/mL Final  . Sodium 11/01/2019 138  135 - 145 mEq/L Final  . Potassium 11/01/2019 3.5  3.5 - 5.1 mEq/L Final  . Chloride 11/01/2019 103  96 - 112 mEq/L Final  . CO2 11/01/2019 31  19 - 32 mEq/L Final  . Glucose, Bld 11/01/2019 103* 70 - 99 mg/dL Final  . BUN 66/44/0347 11  6 - 23 mg/dL Final  . Creatinine, Ser 11/01/2019 0.62  0.40 - 1.20 mg/dL Final  . GFR 42/59/5638 118.78  >60.00 mL/min Final  . Calcium 11/01/2019 9.6  8.4 - 10.5 mg/dL Final  . Hgb V5I MFr Bld 11/01/2019 8.5* 4.6 - 6.5 % Final   Glycemic Control Guidelines for People with Diabetes:Non Diabetic:  <6%Goal of Therapy: <7%Additional Action Suggested:  >8%  Examination:   BP 118/78 (BP Location: Left Arm, Patient Position: Sitting, Cuff Size: Normal)   Pulse 100   Ht 5\' 7"  (1.702 m)   Wt 219 lb 12.8 oz (99.7 kg)   SpO2 98%   BMI 34.43 kg/m   Body mass index is 34.43 kg/m.    ASSESSMENT/ PLAN:    Diabetes type 2 with obesity:   She is on premixed insulin 2-3 times daily and Rybelsus 7 mg along with 1 g Metformin  A1c is 8.5  She has much better blood sugar now with reducing her prednisone which is being slowly tapered off Blood sugars are averaging only 130 with 89%  readings within target range With  Recommendations:  Increase Rybelsus to 2 tablets at the end of this prescription and she will make sure she is tolerating this before getting a 14 mg prescription, this may improve her satiety better especially with continuing high-dose prednisone  Reduce suppertime dose of insulin to 12 units  May skip morning insulin  May need to reduce lunchtime coverage also  She can try to use more sugar-free snacks instead of cookies  HYPOTHYROIDISM: This was initially better controlled with Tirosint instead of Synthroid with about the same doses However without any change in her compliance her TSH is higher again at 19  She will continue Tirosint  175 mcg,  HYPOKALEMIA: Continue potassium supplements for hypokalemia but at the same time we will need to reduce her HCTZ to half tablet   Follow-up in 6 weeks     There are no Patient Instructions on file for this visit.   11/03/2019, 8:57 AM

## 2019-11-03 NOTE — Patient Instructions (Addendum)
Rybelsus 2 of 7 mg with last tray  Insulin 12  U at supper   HCTZ 1/2 daily

## 2019-11-07 ENCOUNTER — Other Ambulatory Visit: Payer: Self-pay | Admitting: Endocrinology

## 2019-11-07 DIAGNOSIS — E1165 Type 2 diabetes mellitus with hyperglycemia: Secondary | ICD-10-CM

## 2019-11-17 ENCOUNTER — Other Ambulatory Visit: Payer: Self-pay

## 2019-11-17 MED ORDER — RYBELSUS 14 MG PO TABS: 1.0000 | ORAL_TABLET | Freq: Every day | ORAL | 2 refills | Status: DC

## 2019-11-22 ENCOUNTER — Other Ambulatory Visit: Payer: Self-pay

## 2019-11-22 MED ORDER — FREESTYLE LIBRE 2 SENSOR MISC: 2.0000 | 3 refills | Status: DC

## 2019-11-22 MED ORDER — TIROSINT 175 MCG PO CAPS: 175.0000 ug | ORAL_CAPSULE | Freq: Every day | ORAL | 2 refills | Status: DC

## 2019-12-13 ENCOUNTER — Other Ambulatory Visit: Payer: Self-pay

## 2019-12-13 MED ORDER — FREESTYLE LIBRE 2 SENSOR MISC: 2.0000 | 3 refills | Status: DC

## 2020-01-03 ENCOUNTER — Other Ambulatory Visit: Payer: Self-pay

## 2020-01-03 ENCOUNTER — Other Ambulatory Visit (INDEPENDENT_AMBULATORY_CARE_PROVIDER_SITE_OTHER): Payer: BC Managed Care – PPO

## 2020-01-03 DIAGNOSIS — E1165 Type 2 diabetes mellitus with hyperglycemia: Secondary | ICD-10-CM

## 2020-01-03 DIAGNOSIS — E890 Postprocedural hypothyroidism: Secondary | ICD-10-CM

## 2020-01-03 LAB — BASIC METABOLIC PANEL
BUN: 12 mg/dL (ref 6–23)
CO2: 29 mEq/L (ref 19–32)
Calcium: 10 mg/dL (ref 8.4–10.5)
Chloride: 102 mEq/L (ref 96–112)
Creatinine, Ser: 0.71 mg/dL (ref 0.40–1.20)
GFR: 101.52 mL/min (ref >60.00)
Glucose, Bld: 102 mg/dL — ABNORMAL HIGH (ref 70–99)
Potassium: 3.4 mEq/L — ABNORMAL LOW (ref 3.5–5.1)
Sodium: 139 mEq/L (ref 135–145)

## 2020-01-03 LAB — TSH: TSH: 0.06 u[IU]/mL — ABNORMAL LOW (ref 0.35–4.50)

## 2020-01-03 LAB — T4, FREE: Free T4: 1.49 ng/dL (ref 0.60–1.60)

## 2020-01-04 LAB — FRUCTOSAMINE: Fructosamine: 234 umol/L (ref 0–285)

## 2020-01-05 ENCOUNTER — Other Ambulatory Visit: Payer: Self-pay

## 2020-01-05 ENCOUNTER — Ambulatory Visit: Payer: BC Managed Care – PPO | Admitting: Endocrinology

## 2020-01-05 ENCOUNTER — Encounter: Payer: Self-pay | Admitting: Endocrinology

## 2020-01-05 VITALS — BP 118/76 | HR 90 | Ht 67.0 in | Wt 223.4 lb

## 2020-01-05 DIAGNOSIS — E876 Hypokalemia: Secondary | ICD-10-CM

## 2020-01-05 DIAGNOSIS — E890 Postprocedural hypothyroidism: Secondary | ICD-10-CM

## 2020-01-05 DIAGNOSIS — E119 Type 2 diabetes mellitus without complications: Secondary | ICD-10-CM | POA: Diagnosis not present

## 2020-01-05 DIAGNOSIS — I10 Essential (primary) hypertension: Secondary | ICD-10-CM | POA: Diagnosis not present

## 2020-01-05 NOTE — Patient Instructions (Addendum)
Stop HCTZ   Tirosint 6 days a week

## 2020-01-05 NOTE — Progress Notes (Signed)
Patient ID: Andrea Delacruz, female   DOB: [birth year], 59 y.o.   MRN: [id number]           Reason for Appointment: Endocrinology follow-up  History of Present Illness   Diagnosis date:  2018  Previous history:  There may have had prediabetes for some time before she was told to have diabetes and started on Metformin No details of her history are available She thinks her blood sugars were relatively well controlled with Metformin 500 mg twice daily.  However her last 2 A1c levels have been 7.2 and 7.3 and she has been followed by her PCP  Recent history:     Non-insulin hypoglycemic drugs: Rybelsus 14 mg daily     Insulin regimen:   None    Side effects from medications:  Diarrhea from high dose metformin  Current diabetes management, blood sugar patterns and problems identified:  Her A1c is on the last visit 8.5  Fructosamine is 234 now  . She has been able to finally stop her insulin in the last 3 weeks or so . She was able to significantly reduce her prednisone dose since her last visit in June and is now on 10 mg only . Now taking 14 mg Rybelsus since her last visit . Her CGM indicates that her blood sugars are fairly consistently near normal throughout the day and within target range 99% of the time she has only minimal HYPERGLYCEMIA with rare high blood sugars late evening probably from snack but generally not over 180 . AVERAGE blood sugar at any given time ranges between 103 and 126, highest at bedtime . She also may have readings in in the mid 60s midday or before dinnertime, asymptomatic . Lab glucose 102 fasting . She has started doing more exercise and is now starting to go for aerobics 3 days a week  However has not been able to lose any weight   Blood sugar data from [place] as above AVERAGE 115 for the last 2 weeks  Sensor description: freestyle libre version 2:       PREVIOUS data  CGM use % of time  97  Average and SD  131  Time in range     89   %    % Time Above 180  10  % Time above 250   % Time Below target  1    PRE-MEAL Fasting Lunch Dinner Bedtime Overall  Glucose range:       Mean/median:  113   153   131   POST-MEAL PC Breakfast PC Lunch PC Dinner  Glucose range:     Mean/median:  120  127 148    Glycemic patterns summary: Blood sugars are very steady throughout the day with some tendency to high readings between 4-6 PM which is before dinner            Dietician visit: Most recent: None     Weight control:  Wt Readings from Last 3 Encounters:  01/05/20 223 lb 6.4 oz (101.3 kg)  11/03/19 219 lb 12.8 oz (99.7 kg)  09/20/19 218 lb 3.2 oz (99 kg)         Diabetes labs:  Lab Results  Component Value Date   HGBA1C 8.5 (H) 11/01/2019   HGBA1C 7.2 (H) 07/14/2019   HGBA1C 7.3 (H) 11/08/2018   Lab Results  Component Value Date   CREATININE 0.71 01/03/2020     Allergies as of 01/05/2020      Reactions  Atorvastatin       Medication List       Accurate as of January 05, 2020  4:23 PM. If you have any questions, ask your nurse or doctor.        acetaminophen 500 MG tablet Commonly known as: TYLENOL Take 500 mg by mouth every 6 (six) hours as needed for mild pain or headache.   calcium carbonate 1250 (500 Ca) MG tablet Commonly known as: OS-CAL - dosed in mg of elemental calcium Take 1 tablet (500 mg of elemental calcium total) by mouth daily with breakfast.   cholecalciferol 25 MCG (1000 UNIT) tablet Commonly known as: VITAMIN D3 Take 1,000 Units by mouth daily.   folic acid 1 MG tablet Commonly known as: FOLVITE Take by mouth.   FreeStyle Libre 2 Sensor Misc 2 Devices by Does not apply route every 14 (fourteen) days.   FreeStyle Precision Neo Test test strip Generic drug: glucose blood Use as instructed to check blood sugar using freestyle libre reader   hydrochlorothiazide 25 MG tablet Commonly known as: HYDRODIURIL Take 25 mg by mouth daily.   Insulin Pen Needle 31G X 5 MM Misc Used  to inject insulin   lisinopril 10 MG tablet Commonly known as: ZESTRIL Take 10 mg by mouth daily.   metFORMIN 500 MG 24 hr tablet Commonly known as: GLUCOPHAGE-XR Take 4 tablets (2,000 mg total) by mouth daily with supper.   methotrexate 250 MG/10ML injection Inject into the skin.   Myrbetriq 50 MG Tb24 tablet Generic drug: mirabegron ER Take 50 mg by mouth daily.   NovoLOG Mix 70/30 FlexPen (70-30) 100 UNIT/ML FlexPen Generic drug: insulin aspart protamine - aspart Inject 20 units under the skin three times daily before meals.   potassium chloride SA 20 MEQ tablet Commonly known as: KLOR-CON Take 1 tablet (20 mEq total) by mouth daily.   predniSONE 20 MG tablet Commonly known as: DELTASONE Take 60 mg by mouth daily with breakfast. Take 3 tablets (60mg  total) by mouth once daily at breakfast.   Rybelsus 14 MG Tabs Generic drug: Semaglutide Take 1 tablet by mouth daily.   Tirosint 175 MCG Caps Generic drug: Levothyroxine Sodium Take 175 mcg by mouth daily before breakfast.       Allergies:  Allergies  Allergen Reactions  . Atorvastatin     Past Medical History:  Diagnosis Date  . Complication of anesthesia   . Diabetes mellitus without complication (HCC)   . Hypertension   . Obesity   . PONV (postoperative nausea and vomiting)   . Thyroid disease    Hyperactive then was treated with iodine.     Past Surgical History:  Procedure Laterality Date  . BREAST REDUCTION SURGERY   . MUSCLE BIOPSY Right 07/15/2019   Procedure: MUSCLE BIOPSY RIGHT DELTOID AND RIGHT RECTUS FEMORIS MUSCLE;  Surgeon: 07/17/2019, MD;  Location: [facility] OR;  Service: General;  Laterality: Right;    Family History  Problem Relation Age of Onset  . Stroke Mother   . Diabetes Mother   . Diabetes Father   . Diabetes Sister   . Hypertension Sister   . Hypertension Maternal Aunt   . Cancer Maternal Uncle   . Diabetes Paternal Aunt   . Diabetes Paternal Uncle   . Cancer  Maternal Grandmother     Social History:  reports that she has never smoked. She has never used smokeless tobacco. She reports current alcohol use. She reports that she does not use drugs.  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 Free T4 01/03/2020 1.49  0.60 - 1.60 ng/dL Final   Comment: Specimens from patients who are undergoing biotin therapy and /or ingesting biotin supplements may contain high levels of biotin.  The higher biotin concentration in these specimens interferes with this Free T4 assay.  Specimens that contain high levels  of biotin may cause false high results for this Free T4 assay.  Please interpret results in light of the total clinical presentation of the patient.    Marland Kitchen. TSH 01/03/2020 0.06* 0.35 - 4.50 uIU/mL Final  . Fructosamine 01/03/2020 234  0 - 285 umol/L Final     Comment: Published reference interval for apparently healthy subjects between age 60 and 3360 is 73205 - 285 umol/L and in a poorly controlled diabetic population is 228 - 563 umol/L with a mean of 396 umol/L.   Marland Kitchen. Sodium 01/03/2020 139  135 - 145 mEq/L Final  . Potassium 01/03/2020 3.4* 3.5 - 5.1 mEq/L Final  . Chloride 01/03/2020 102  96 - 112 mEq/L Final  . CO2 01/03/2020 29  19 - 32 mEq/L Final  . Glucose, Bld 01/03/2020 102* 70 - 99 mg/dL Final  . BUN 16/10/960408/17/2021 12  6 - 23 mg/dL Final  . Creatinine, Ser 01/03/2020 0.71  0.40 - 1.20 mg/dL Final  . GFR 54/09/811908/17/2021 101.52  >60.00 mL/min Final  . Calcium 01/03/2020 10.0  8.4 - 10.5 mg/dL Final     Examination:   BP 118/76 (BP Location: Left Arm, Patient Position: Sitting, Cuff Size: Large)   Pulse 90   Ht 5\' 7"  (1.702 m)   Wt 223 lb 6.4 oz (101.3 kg)   SpO2 95%   BMI 34.99 kg/m   Body mass index is 34.99 kg/m.    ASSESSMENT/ PLAN:    Diabetes type 2 with obesity:   A1c was last 8.5 but fructosamine is now 234  She has significant improvement in her glucose control now with reducing her prednisone, now taking only 10 mg Blood sugars are near normal with only 14 mg Rybelsus and no insulin not Metformin Average blood sugar at home 115 and her freestyle Josephine Igolibre appears to be accurate   For now she will continue the same regimen  She is going to continue increasing exercise and efforts  to lose weight with improved diet  HYPOTHYROIDISM: This is better controlled with Tirosint instead of Synthroid However without any change in her weight or the way she takes her Tirosint her TSH is suppressed She is asymptomatic  She will continue Tirosint  175 mcg until she finishes her current supply with using 6 tablets a week, next prescription will be for 150 mcg  HYPOKALEMIA: Since her blood pressure is low normal she can stop her HCTZ and also likely stop potassium She will discuss further with PCP at the upcoming visit  Follow-up in  2 months unless having any problems    There are no Patient Instructions on file for this visit.   Reather Littler 01/05/2020, 4:23 PM

## 2020-01-10 ENCOUNTER — Ambulatory Visit: Payer: BC Managed Care – PPO | Admitting: Endocrinology

## 2020-01-12 ENCOUNTER — Other Ambulatory Visit: Payer: Self-pay

## 2020-01-12 MED ORDER — FREESTYLE LIBRE 2 SENSOR MISC: 2.0000 | 3 refills | Status: DC

## 2020-01-14 ENCOUNTER — Other Ambulatory Visit: Payer: Self-pay | Admitting: Endocrinology

## 2020-01-24 ENCOUNTER — Other Ambulatory Visit: Payer: Self-pay | Admitting: Rheumatology

## 2020-01-24 DIAGNOSIS — E2839 Other primary ovarian failure: Secondary | ICD-10-CM

## 2020-02-24 ENCOUNTER — Other Ambulatory Visit: Payer: Self-pay | Admitting: Endocrinology

## 2020-02-28 ENCOUNTER — Ambulatory Visit: Payer: BC Managed Care – PPO | Attending: Internal Medicine

## 2020-02-28 DIAGNOSIS — Z23 Encounter for immunization: Secondary | ICD-10-CM

## 2020-02-28 NOTE — Progress Notes (Signed)
   Covid-[facility] Vaccination [facility]  Name:  Andrea Delacruz    MRN: [id number] DOB: Sep 09, 1960  02/28/2020  Andrea Delacruz was observed post Covid-19 immunization for 15 minutes without incident. She was provided with Vaccine Information Sheet and instruction to access the V-Safe system.   Andrea Delacruz was instructed to call 911 with any severe reactions post vaccine: Marland Kitchen Difficulty breathing  . Swelling of face and throat  . A fast heartbeat  . A bad rash all over body  . Dizziness and weakness

## 2020-03-02 ENCOUNTER — Other Ambulatory Visit: Payer: Self-pay

## 2020-03-02 ENCOUNTER — Other Ambulatory Visit (INDEPENDENT_AMBULATORY_CARE_PROVIDER_SITE_OTHER): Payer: BC Managed Care – PPO

## 2020-03-02 DIAGNOSIS — E890 Postprocedural hypothyroidism: Secondary | ICD-10-CM | POA: Diagnosis not present

## 2020-03-02 DIAGNOSIS — E119 Type 2 diabetes mellitus without complications: Secondary | ICD-10-CM | POA: Diagnosis not present

## 2020-03-02 LAB — T4, FREE: Free T4: 1.33 ng/dL (ref 0.60–1.60)

## 2020-03-02 LAB — HEMOGLOBIN A1C: Hgb A1c MFr Bld: 6.5 % (ref 4.6–6.5)

## 2020-03-02 LAB — BASIC METABOLIC PANEL
BUN: 20 mg/dL (ref 6–23)
CO2: 30 mEq/L (ref 19–32)
Calcium: 10.1 mg/dL (ref 8.4–10.5)
Chloride: 99 mEq/L (ref 96–112)
Creatinine, Ser: 0.89 mg/dL (ref 0.40–1.20)
GFR: 70.24 mL/min (ref >60.00)
Glucose, Bld: 112 mg/dL — ABNORMAL HIGH (ref 70–99)
Potassium: 3.5 mEq/L (ref 3.5–5.1)
Sodium: 137 mEq/L (ref 135–145)

## 2020-03-02 LAB — TSH: TSH: 0.04 u[IU]/mL — ABNORMAL LOW (ref 0.35–4.50)

## 2020-03-05 NOTE — Progress Notes (Addendum)
Patient ID: Andrea Delacruz, female   DOB: 10-25-1960, 59 y.o.   MRN: [id number]           Reason for Appointment: Endocrinology follow-up  I connected with the above-named patient by video enabled telemedicine application and verified that I am speaking with the correct person. The patient was explained the limitations of evaluation and management by telemedicine and the availability of in person appointments.  Patient also understood that there may be a patient responsible charge related to this service . Location of the patient: Patient's home . Location of the provider: Physician office Only the patient and myself were participating in the encounter The patient understood the above statements and agreed to proceed.  History of Present Illness   Diagnosis date:  2018  Previous history:  There may have had prediabetes for some time before she was told to have diabetes and started on Metformin No details of her history are available She thinks her blood sugars were relatively well controlled with Metformin 500 mg twice daily.  However her last 2 A1c levels have been 7.2 and 7.3 and she has been followed by her PCP  Recent history:     Non-insulin hypoglycemic drugs: Rybelsus 14 mg daily     Side effects from medications:  Diarrhea from high dose metformin  Current diabetes management, blood sugar patterns and problems identified:  Her A1c is now 6.5,  on the last visit 8.5   . She has been able to reduce her prednisone dose further and now on only 5 mg  . Only taking 14 mg Rybelsus since she had wanted to minimize her medications and stopped Metformin especially with low normal sugars . She is finding that her freestyle Josephine Igo is reading at least 10 mg lower than the fingersticks and has not used it for the last week . Her libre for the previous week indicates that her blood sugars are fairly consistently near normal throughout the day and within target range 100 % . Recently  fingerstick blood sugars are under 140 including after meals  . With fingerstick fasting readings have not been checked but recently 115 . She is walking up to 5 miles and on the other days will work with her trainer . Her weight is now about 215 and improved   Blood sugar data from [facility] as above with recent overall average 102 only            Dietician visit: Most recent: None     Weight control:  Wt Readings from Last 3 Encounters:  03/06/20 210 lb (95.3 kg)  01/05/20 223 lb 6.4 oz (101.3 kg)  11/03/19 219 lb 12.8 oz (99.7 kg)         Diabetes labs:  Lab Results  Component Value Date   HGBA1C 6.5 03/02/2020   HGBA1C 8.5 (H) 11/01/2019   HGBA1C 7.2 (H) 07/14/2019   Lab Results  Component Value Date   CREATININE 0.89 03/02/2020    Hypothyroidism and other issues: See review of systems   Allergies as of 03/06/2020      Reactions   Atorvastatin       Medication List       Accurate as of March 06, 2020  8:21 AM. If you have any questions, ask your nurse or doctor.        acetaminophen 500 MG tablet Commonly known as: TYLENOL Take 500 mg by mouth every 6 (six) hours as needed for mild pain or headache.  calcium carbonate 1250 (500 Ca) MG tablet Commonly known as: OS-CAL - dosed in mg of elemental calcium Take 1 tablet (500 mg of elemental calcium total) by mouth daily with breakfast.   cholecalciferol 25 MCG (1000 UNIT) tablet Commonly known as: VITAMIN D3 Take 1,000 Units by mouth daily.   folic acid 1 MG tablet Commonly known as: FOLVITE Take by mouth.   FreeStyle Libre 2 Sensor Misc 2 Devices by Does not apply route every 14 (fourteen) days.   FreeStyle Precision Neo Test test strip Generic drug: glucose blood Use as instructed to check blood sugar using freestyle libre reader   hydrochlorothiazide 25 MG tablet Commonly known as: HYDRODIURIL Take 25 mg by mouth daily.   Insulin Pen Needle 31G X 5 MM Misc Used to inject insulin    lisinopril 10 MG tablet Commonly known as: ZESTRIL Take 10 mg by mouth daily.   metFORMIN 500 MG 24 hr tablet Commonly known as: GLUCOPHAGE-XR Take 4 tablets (2,000 mg total) by mouth daily with supper.   methotrexate 250 MG/10ML injection Inject into the skin.   Myrbetriq 50 MG Tb24 tablet Generic drug: mirabegron ER Take 50 mg by mouth daily.   NovoLOG Mix 70/30 FlexPen (70-30) 100 UNIT/ML FlexPen Generic drug: insulin aspart protamine - aspart Inject 20 units under the skin three times daily before meals.   potassium chloride SA 20 MEQ tablet Commonly known as: KLOR-CON Take 1 tablet (20 mEq total) by mouth daily.   predniSONE 20 MG tablet Commonly known as: DELTASONE Take 5 mg by mouth daily with breakfast. Take 3 tablets (60mg  total) by mouth once daily at breakfast.   Rybelsus 14 MG Tabs Generic drug: Semaglutide TAKE 1 TABLET BY MOUTH EVERY DAY   Tirosint 175 MCG Caps Generic drug: Levothyroxine Sodium Take 175 mcg by mouth daily before breakfast.       Allergies:  Allergies  Allergen Reactions  . Atorvastatin     Past Medical History:  Diagnosis Date  . Complication of anesthesia   . Diabetes mellitus without complication (HCC)   . Hypertension   . Obesity   . PONV (postoperative nausea and vomiting)   . Thyroid disease    Hyperactive then was treated with iodine.     Past Surgical History:  Procedure Laterality Date  . BREAST REDUCTION SURGERY   . MUSCLE BIOPSY Right 07/15/2019   Procedure: MUSCLE BIOPSY RIGHT DELTOID AND RIGHT RECTUS FEMORIS MUSCLE;  Surgeon: 07/17/2019, MD;  Location: [facility];  Service: General;  Laterality: Right;    Family History  Problem Relation Age of Onset  . Stroke Mother   . Diabetes Mother   . Diabetes Father   . Diabetes Sister   . Hypertension Sister   . Hypertension Maternal Aunt   . Cancer Maternal Uncle   . Diabetes Paternal Aunt   . Diabetes Paternal Uncle   . Cancer Maternal Grandmother      Social History:  reports that she has never smoked. She has never used smokeless tobacco. She reports current alcohol use. She reports that she does not use drugs.  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 Free T4 03/02/2020 1.33  0.60 - 1.60 ng/dL Final   Comment: Specimens from patients who are undergoing biotin therapy and /or ingesting biotin supplements may contain high levels of biotin.  The higher biotin concentration in these specimens interferes with this Free T4 assay.  Specimens that contain high levels  of biotin may cause false high results for this Free T4 assay.  Please interpret results in light of the total clinical presentation of the patient.    Marland Kitchen TSH 03/02/2020 0.04* 0.35 - 4.50 uIU/mL Final  . Sodium 03/02/2020 137  135 - 145 mEq/L Final  .  Potassium 03/02/2020 3.5  3.5 - 5.1 mEq/L Final  . Chloride 03/02/2020 99  96 - 112 mEq/L Final  . CO2 03/02/2020 30  19 - 32 mEq/L Final  . Glucose, Bld 03/02/2020 112* 70 - 99 mg/dL Final  . BUN 03/47/4259 20  6 - 23 mg/dL Final  . Creatinine, Ser 03/02/2020 0.89  0.40 - 1.20 mg/dL Final  . GFR 56/38/7564 70.24  >60.00 mL/min Final  . Calcium 03/02/2020 10.1  8.4 - 10.5 mg/dL Final  . Hgb P3I MFr Bld 03/02/2020 6.5  4.6 - 6.5 % Final   Glycemic Control Guidelines for People with Diabetes:Non Diabetic:  <6%Goal of Therapy: <7%Additional Action Suggested:  >8%      Examination:   Ht 5\' 7"  (1.702 m)   Wt 210 lb (95.3 kg)   BMI 32.89 kg/m   Body mass index is 32.89 kg/m.    ASSESSMENT/ PLAN:    Diabetes type 2 with obesity:   A1c is excellent at 6.5  She is on Rybelsus 14 mg alone  She has finally been able to lose weight with increasing exercise Blood sugars are excellent at home and currently preferring to monitor with fingersticks since freestyle libre appears to be slightly lower than expected including some readings in the 60s  She will continue Rybelsus for long-term benefits, potential for helping with keep her weight down Discussed fasting and postprandial targets  HYPOTHYROIDISM: This is better controlled with Tirosint instead of Synthroid but she is concerned about the cost  Since she is requiring lower doses now may be able to go back to Synthroid and will put her on 125 mcg daily Follow-up in 2 months for repeat thyroid  HYPOKALEMIA: To be followed by PCP again, likely needs only 12.5 HCTZ or switch to triamterene HCT       There are no Patient Instructions on file for this visit.   03/06/2020, 8:21 AM

## 2020-03-06 ENCOUNTER — Other Ambulatory Visit: Payer: Self-pay

## 2020-03-06 ENCOUNTER — Telehealth (INDEPENDENT_AMBULATORY_CARE_PROVIDER_SITE_OTHER): Payer: BC Managed Care – PPO | Admitting: Endocrinology

## 2020-03-06 ENCOUNTER — Encounter: Payer: Self-pay | Admitting: Endocrinology

## 2020-03-06 VITALS — Ht 67.0 in | Wt 210.0 lb

## 2020-03-06 DIAGNOSIS — E119 Type 2 diabetes mellitus without complications: Secondary | ICD-10-CM

## 2020-03-06 DIAGNOSIS — E890 Postprocedural hypothyroidism: Secondary | ICD-10-CM

## 2020-03-06 MED ORDER — SYNTHROID 125 MCG PO TABS: 125.0000 ug | ORAL_TABLET | Freq: Every day | ORAL | 2 refills | Status: DC

## 2020-03-08 ENCOUNTER — Other Ambulatory Visit: Payer: Self-pay | Admitting: Endocrinology

## 2020-03-12 ENCOUNTER — Other Ambulatory Visit: Payer: Self-pay | Admitting: *Deleted

## 2020-03-12 MED ORDER — FREESTYLE PRECISION NEO TEST VI STRP: ORAL_STRIP | 3 refills | Status: DC

## 2020-03-12 MED ORDER — ONETOUCH DELICA LANCETS 33G MISC: 3 refills | Status: DC

## 2020-03-19 ENCOUNTER — Other Ambulatory Visit: Payer: Self-pay | Admitting: Endocrinology

## 2020-03-21 ENCOUNTER — Other Ambulatory Visit: Payer: Self-pay | Admitting: Endocrinology

## 2020-04-10 ENCOUNTER — Other Ambulatory Visit: Payer: Self-pay

## 2020-04-10 ENCOUNTER — Ambulatory Visit
Admission: RE | Admit: 2020-04-10 | Discharge: 2020-04-10 | Disposition: A | Discharge status: Home / Self Care | Payer: BC Managed Care – PPO | Source: Ambulatory Visit | Attending: Rheumatology | Admitting: Rheumatology

## 2020-04-10 DIAGNOSIS — E2839 Other primary ovarian failure: Secondary | ICD-10-CM

## 2020-05-25 ENCOUNTER — Other Ambulatory Visit (INDEPENDENT_AMBULATORY_CARE_PROVIDER_SITE_OTHER): Payer: Self-pay

## 2020-05-25 ENCOUNTER — Other Ambulatory Visit: Payer: Self-pay

## 2020-05-25 DIAGNOSIS — E890 Postprocedural hypothyroidism: Secondary | ICD-10-CM

## 2020-05-25 LAB — TSH: TSH: 5.24 u[IU]/mL — ABNORMAL HIGH (ref 0.35–4.50)

## 2020-05-25 LAB — T4, FREE: Free T4: 0.75 ng/dL (ref 0.60–1.60)

## 2020-05-28 ENCOUNTER — Ambulatory Visit: Payer: BC Managed Care – PPO | Admitting: Endocrinology

## 2020-05-28 ENCOUNTER — Other Ambulatory Visit: Payer: Self-pay

## 2020-05-28 ENCOUNTER — Encounter: Payer: Self-pay | Admitting: Endocrinology

## 2020-05-28 VITALS — BP 128/88 | HR 89 | Ht 67.0 in | Wt 216.6 lb

## 2020-05-28 DIAGNOSIS — E119 Type 2 diabetes mellitus without complications: Secondary | ICD-10-CM

## 2020-05-28 DIAGNOSIS — E890 Postprocedural hypothyroidism: Secondary | ICD-10-CM

## 2020-05-28 LAB — POCT GLYCOSYLATED HEMOGLOBIN (HGB A1C): Hemoglobin A1C: 5.8 % — AB (ref 4.0–5.6)

## 2020-05-28 NOTE — Progress Notes (Signed)
Patient ID: Andrea Delacruz, female   DOB: 1960/12/27, 60 y.o.   MRN: [id number]           Reason for Appointment: Endocrinology follow-up     History of Present Illness   DIABETES type 2, diagnosis date:  2018  Previous history:  There may have had prediabetes for some time before she was told to have diabetes and started on Metformin No details of her history are available She thinks her blood sugars were relatively well controlled with Metformin 500 mg twice daily.  However her last 2 A1c levels have been 7.2 and 7.3 and she has been followed by her PCP  Recent history:     Non-insulin hypoglycemic drugs: Rybelsus 14 mg daily     Side effects from medications:  Diarrhea from high dose metformin  Current diabetes management, blood sugar patterns and problems identified:  Her A1c is now 5.8 compared to 6.5   . She is still taking 14 mg Rybelsus, previously on metformin also . Since her freestyle Josephine Igo was not quite accurate she is not now using the test strips with the freestyle reader . Checking blood sugars mostly in the morning . Blood sugars are overall mostly within the normal range  . She is walking very regularly and doing work out with a trainer 3 days weekly at least . Her weight is however higher since her last visit . No side effects with Rybelsus which she takes every morning consistently   Blood sugar readings  RECENT fasting range 64-103, using freestyle neo Bedtime 95, 128, suppertime 94           Dietician visit: Most recent: None     Weight control:  Wt Readings from Last 3 Encounters:  05/28/20 216 lb 9.6 oz (98.2 kg)  03/06/20 210 lb (95.3 kg)  01/05/20 223 lb 6.4 oz (101.3 kg)         Diabetes labs:  Lab Results  Component Value Date   HGBA1C 5.8 (A) 05/28/2020   HGBA1C 6.5 03/02/2020   HGBA1C 8.5 (H) 11/01/2019   Lab Results  Component Value Date   CREATININE 0.89 03/02/2020    Hypothyroidism and other issues: See review of  systems   Allergies as of 05/28/2020      Reactions   Atorvastatin       Medication List       Accurate as of May 28, 2020  3:00 PM. If you have any questions, ask your nurse or doctor.        STOP taking these medications   predniSONE 20 MG tablet Commonly known as: DELTASONE Stopped by: Reather Littler, MD     TAKE these medications   acetaminophen 500 MG tablet Commonly known as: TYLENOL Take 500 mg by mouth every 6 (six) hours as needed for mild pain or headache.   calcium carbonate 1250 (500 Ca) MG tablet Commonly known as: OS-CAL - dosed in mg of elemental calcium Take 1 tablet (500 mg of elemental calcium total) by mouth daily with breakfast.   cholecalciferol 25 MCG (1000 UNIT) tablet Commonly known as: VITAMIN D3 Take 1,000 Units by mouth daily.   ezetimibe 10 MG tablet Commonly known as: ZETIA Take 10 mg by mouth daily.   folic acid 1 MG tablet Commonly known as: FOLVITE Take by mouth.   FreeStyle Libre 2 Sensor Misc 2 Devices by Does not apply route every 14 (fourteen) days.   FreeStyle Precision Neo Test test strip Generic drug: glucose  blood Use as instructed to check blood sugar using freestyle libre reader   hydrochlorothiazide 25 MG tablet Commonly known as: HYDRODIURIL Take 25 mg by mouth daily.   Insulin Pen Needle 31G X 5 MM Misc Used to inject insulin   Klor-Con M20 20 MEQ tablet Generic drug: potassium chloride SA TAKE 1 TABLET BY MOUTH EVERY DAY   lisinopril 10 MG tablet Commonly known as: ZESTRIL Take 10 mg by mouth daily.   metFORMIN 500 MG 24 hr tablet Commonly known as: GLUCOPHAGE-XR TAKE 4 TABLETS (2,000 MG TOTAL) BY MOUTH DAILY WITH SUPPER.   methotrexate 250 MG/10ML injection Inject into the skin.   mirabegron ER 50 MG Tb24 tablet Commonly known as: MYRBETRIQ Take 50 mg by mouth daily.   OneTouch Delica Lancets 33G Misc Use as directed to check blood sugar   Rybelsus 14 MG Tabs Generic drug: Semaglutide TAKE 1  TABLET BY MOUTH EVERY DAY   Synthroid 125 MCG tablet Generic drug: levothyroxine Take 1 tablet (125 mcg total) by mouth daily before breakfast.       Allergies:  Allergies  Allergen Reactions  . Atorvastatin     Past Medical History:  Diagnosis Date  . Complication of anesthesia   . Diabetes mellitus without complication (HCC)   . Hypertension   . Obesity   . PONV (postoperative nausea and vomiting)   . Thyroid disease    Hyperactive then was treated with iodine.     Past Surgical History:  Procedure Laterality Date  . BREAST REDUCTION SURGERY  [id number]  . MUSCLE BIOPSY Right 07/15/2019   Procedure: MUSCLE BIOPSY RIGHT DELTOID AND RIGHT RECTUS FEMORIS MUSCLE;  Surgeon: Gaynelle Adu, MD;  Location: [facility] OR;  Service: General;  Laterality: Right;    Family History  Problem Relation Age of Onset  . Stroke Mother   . Diabetes Mother   . Diabetes Father   . Diabetes Sister   . Hypertension Sister   . Hypertension Maternal Aunt   . Cancer Maternal Uncle   . Diabetes Paternal Aunt   . Diabetes Paternal Uncle   . Cancer Maternal Grandmother     Social History:  reports that she has never smoked. She has never used smokeless tobacco. She reports current alcohol use. She reports that she does not use drugs.  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, was on prednisone along with methotrexate injectable with improvement in  symptoms Recently not on prednisone but her CPK levels have been variable  HYPERTENSION treated with HCTZ 12.5 mg by PCP as also lisinopril She has a BP monitor at home  BP Readings from Last 3 Encounters:  05/28/20 128/88  01/05/20 118/76  11/03/19 118/78   Potassium is low normal, taking potassium supplements from PCP  Lab Results  Component Value Date   K 3.5 03/02/2020    LIPIDS: She thinks she had  myositis from atorvastatin Last LDL was 155 followed by PCP   LABS:  Office Visit on 05/28/2020  Component Date Value Ref Range Status  . Hemoglobin A1C 05/28/2020 5.8* 4.0 - 5.6 % Final  Lab on 05/25/2020  Component Date Value Ref Range Status  . Free T4 05/25/2020 0.75  0.60 - 1.60 ng/dL Final   Comment: Specimens from patients who are undergoing biotin therapy and /or ingesting biotin supplements may contain high levels of biotin.  The higher biotin concentration in these specimens interferes with this Free T4 assay.  Specimens that contain high levels  of biotin may cause false high results for this Free T4 assay.  Please interpret results in light of the total clinical presentation of the patient.    Marland Kitchen TSH 05/25/2020 5.24* 0.35 - 4.50 uIU/mL Final     Examination:   BP 128/88   Pulse 89   Ht 5\' 7"  (1.702 m)   Wt 216 lb 9.6 oz (98.2 kg)   SpO2 95%   BMI 33.92 kg/m   Body mass index is 33.92 kg/m.    ASSESSMENT/ PLAN:    Diabetes type 2 with obesity:   A1c is excellent at 5.8  She is on Rybelsus 14 mg monotherapy  Although she is getting better blood sugar control compared to before she appears to have gained weight and not clear why She is doing well with her diet and exercise regimen Blood sugars are fairly close to normal at home at different times although checking mostly in the morning  She will continue Rybelsus for weight benefits and cardiovascular risk reduction She will try to use the One Touch Verio meter which will be more accurate and able to  analyze her readings better   HYPOTHYROIDISM: She is back on Synthroid which is less expensive However appears to need a larger dose compared to Tirosint as TSH is now high at about 5 although she is asymptomatic She has not missed any doses and is taking her levothyroxine as directed before eating  She will now go to 137 mcg instead of 125 and continue brand-name Synthroid    There are no Patient Instructions on file for this visit.   05/28/2020, 3:00 PM

## 2020-06-05 ENCOUNTER — Other Ambulatory Visit: Payer: Self-pay | Admitting: Endocrinology

## 2020-06-05 MED ORDER — SYNTHROID 137 MCG PO TABS: 137.0000 ug | ORAL_TABLET | Freq: Every day | ORAL | 3 refills | Status: DC

## 2020-06-06 ENCOUNTER — Other Ambulatory Visit: Payer: Self-pay | Admitting: Endocrinology

## 2020-07-24 ENCOUNTER — Other Ambulatory Visit: Payer: BC Managed Care – PPO

## 2020-07-31 ENCOUNTER — Ambulatory Visit: Payer: BC Managed Care – PPO | Admitting: Endocrinology

## 2020-08-01 ENCOUNTER — Other Ambulatory Visit: Payer: Self-pay | Admitting: Endocrinology

## 2020-08-02 ENCOUNTER — Other Ambulatory Visit: Payer: BC Managed Care – PPO

## 2020-08-06 LAB — HM DIABETES EYE EXAM

## 2020-08-09 ENCOUNTER — Ambulatory Visit: Payer: BC Managed Care – PPO | Admitting: Endocrinology

## 2020-08-16 ENCOUNTER — Telehealth: Payer: Self-pay | Admitting: Endocrinology

## 2020-08-16 NOTE — Telephone Encounter (Signed)
Patient states is feeling bad and wants to come in for labs earlier than mid-April.  Per patient Dr Lucianne Muss said she could call in for lab to be ordered anytime she felt off.  Please let patient know and advise regarding labs

## 2020-08-17 ENCOUNTER — Other Ambulatory Visit: Payer: Self-pay | Admitting: Endocrinology

## 2020-08-17 NOTE — Telephone Encounter (Signed)
Spoke to pt stated--having little fatigue for about 1 week. Notified pt dr. Lucianne Muss is not in the office but have doctor on call available. Pt wanted to wait for the lab appt  which 08/29/20, but if the symptoms get worse will call the office back.

## 2020-08-29 ENCOUNTER — Other Ambulatory Visit: Payer: Self-pay

## 2020-08-29 ENCOUNTER — Other Ambulatory Visit (INDEPENDENT_AMBULATORY_CARE_PROVIDER_SITE_OTHER): Payer: BC Managed Care – PPO

## 2020-08-29 DIAGNOSIS — E890 Postprocedural hypothyroidism: Secondary | ICD-10-CM | POA: Diagnosis not present

## 2020-08-29 DIAGNOSIS — E119 Type 2 diabetes mellitus without complications: Secondary | ICD-10-CM

## 2020-08-29 LAB — BASIC METABOLIC PANEL
BUN: 12 mg/dL (ref 6–23)
CO2: 30 mEq/L (ref 19–32)
Calcium: 10.2 mg/dL (ref 8.4–10.5)
Chloride: 100 mEq/L (ref 96–112)
Creatinine, Ser: 0.87 mg/dL (ref 0.40–1.20)
GFR: 72.17 mL/min (ref >60.00)
Glucose, Bld: 75 mg/dL (ref 70–99)
Potassium: 4 mEq/L (ref 3.5–5.1)
Sodium: 136 mEq/L (ref 135–145)

## 2020-08-29 LAB — TSH: TSH: 18.03 u[IU]/mL — ABNORMAL HIGH (ref 0.35–4.50)

## 2020-08-29 LAB — T4, FREE: Free T4: 0.68 ng/dL (ref 0.60–1.60)

## 2020-09-01 ENCOUNTER — Other Ambulatory Visit: Payer: Self-pay | Admitting: Endocrinology

## 2020-09-03 ENCOUNTER — Encounter: Payer: Self-pay | Admitting: Endocrinology

## 2020-09-03 ENCOUNTER — Ambulatory Visit: Payer: BC Managed Care – PPO | Admitting: Endocrinology

## 2020-09-03 VITALS — BP 128/84 | HR 76 | Ht 67.0 in | Wt 215.4 lb

## 2020-09-03 DIAGNOSIS — E890 Postprocedural hypothyroidism: Secondary | ICD-10-CM | POA: Diagnosis not present

## 2020-09-03 DIAGNOSIS — E119 Type 2 diabetes mellitus without complications: Secondary | ICD-10-CM

## 2020-09-03 MED ORDER — SYNTHROID 175 MCG PO TABS: 175.0000 ug | ORAL_TABLET | Freq: Every day | ORAL | 2 refills | Status: DC

## 2020-09-03 NOTE — Progress Notes (Signed)
Patient ID: Andrea Delacruz, female   DOB: 1960-09-01, 60 y.o.   MRN: [id number]           Reason for Appointment: Endocrinology follow-up     History of Present Illness   DIABETES type 2, diagnosis date:  2018  Previous history:  There may have had prediabetes for some time before she was told to have diabetes and started on Metformin No details of her history are available She thinks her blood sugars were relatively well controlled with Metformin 500 mg twice daily.  However her last 2 A1c levels have been 7.2 and 7.3 and she has been followed by her PCP  Recent history:     Non-insulin hypoglycemic drugs: Rybelsus 14 mg daily     Side effects from medications:  Diarrhea from high dose metformin  Current diabetes management, blood sugar patterns and problems identified:  Her A1c is last 5.8   . She is taking 14 mg Rybelsus, previously on metformin also . She did not bring her monitor but she feels like her blood sugars are fairly consistently near normal . Lab glucose 75  . She is exercising very regularly and doing work out with a trainer 3 days weekly along with walking 1 more day . Her weight is however higher since her last visit . No side effects with Rybelsus which she takes every morning consistently up to 60-minute before breakfast   Blood sugar readings recent range 98-120  Previous fasting range 64-103, using freestyle neo Bedtime 95, 128, suppertime 94           Dietician visit: Most recent: None     Weight control:  Wt Readings from Last 3 Encounters:  09/03/20 215 lb 6.4 oz (97.7 kg)  05/28/20 216 lb 9.6 oz (98.2 kg)  03/06/20 210 lb (95.3 kg)         Diabetes labs:  Lab Results  Component Value Date   HGBA1C 5.8 (A) 05/28/2020   HGBA1C 6.5 03/02/2020   HGBA1C 8.5 (H) 11/01/2019   Lab Results  Component Value Date   CREATININE 0.87 08/29/2020    Hypothyroidism and other issues: See review of systems   Allergies as of 09/03/2020       Reactions   Atorvastatin       Medication List       Accurate as of September 03, 2020 12:18 PM. If you have any questions, ask your nurse or doctor.        STOP taking these medications   metFORMIN 500 MG 24 hr tablet Commonly known as: GLUCOPHAGE-XR Stopped by: Reather Littler, MD     TAKE these medications   acetaminophen 500 MG tablet Commonly known as: TYLENOL Take 500 mg by mouth every 6 (six) hours as needed for mild pain or headache.   calcium carbonate 1250 (500 Ca) MG tablet Commonly known as: OS-CAL - dosed in mg of elemental calcium Take 1 tablet (500 mg of elemental calcium total) by mouth daily with breakfast.   cholecalciferol 25 MCG (1000 UNIT) tablet Commonly known as: VITAMIN D3 Take 1,000 Units by mouth daily.   ezetimibe 10 MG tablet Commonly known as: ZETIA Take 10 mg by mouth daily.   folic acid 1 MG tablet Commonly known as: FOLVITE Take by mouth.   FreeStyle Libre 2 Sensor Misc 2 Devices by Does not apply route every 14 (fourteen) days.   hydrochlorothiazide 25 MG tablet Commonly known as: HYDRODIURIL Take 25 mg by mouth daily.  Insulin Pen Needle 31G X 5 MM Misc Used to inject insulin   Klor-Con M20 20 MEQ tablet Generic drug: potassium chloride SA TAKE 1 TABLET BY MOUTH EVERY DAY   lisinopril 10 MG tablet Commonly known as: ZESTRIL Take 10 mg by mouth daily.   methotrexate 250 MG/10ML injection Inject into the skin.   mirabegron ER 50 MG Tb24 tablet Commonly known as: MYRBETRIQ Take 50 mg by mouth daily.   OneTouch Delica Lancets 33G Misc Use as directed to check blood sugar   OneTouch Verio test strip Generic drug: glucose blood USE AS DIRECTED ACCU CHECK GUIDE PREFFERD BY INSURANCE   Rybelsus 14 MG Tabs Generic drug: Semaglutide TAKE 1 TABLET BY MOUTH EVERY DAY   Synthroid 137 MCG tablet Generic drug: levothyroxine Take 1 tablet (137 mcg total) by mouth daily before breakfast.       Allergies:  Allergies  Allergen  Reactions  . Atorvastatin     Past Medical History:  Diagnosis Date  . Complication of anesthesia   . Diabetes mellitus without complication (HCC)   . Hypertension   . Obesity   . PONV (postoperative nausea and vomiting)   . Thyroid disease    Hyperactive then was treated with iodine.     Past Surgical History:  Procedure Laterality Date  . BREAST REDUCTION SURGERY  [id number]  . MUSCLE BIOPSY Right 07/15/2019   Procedure: MUSCLE BIOPSY RIGHT DELTOID AND RIGHT RECTUS FEMORIS MUSCLE;  Surgeon: Gaynelle Adu, MD;  Location: [facility] OR;  Service: General;  Laterality: Right;    Family History  Problem Relation Age of Onset  . Stroke Mother   . Diabetes Mother   . Diabetes Father   . Diabetes Sister   . Hypertension Sister   . Hypertension Maternal Aunt   . Cancer Maternal Uncle   . Diabetes Paternal Aunt   . Diabetes Paternal Uncle   . Cancer Maternal Grandmother     Social History:  reports that she has never smoked. She has never used smokeless tobacco. She reports current alcohol use. She reports that she does not use drugs.  ROS  She has post ablative hypothyroidism Since about 08/2019 she was taking Tirosint up to , was not controlled with Synthroid  previously  More recently she is on Synthroid brand name 137 mcg daily She has not felt any unusual fatigue, or an occasional daily she may feel tired She does tend to have more cold intolerance  TSH which was again suppressed in 10/21 is now progressively higher and up to 18 compared to 5.2   Lab Results  Component Value Date   TSH 18.03 (H) 08/29/2020   TSH 5.24 (H) 05/25/2020   TSH 0.04 (L) 03/02/2020   FREET4 0.68 08/29/2020   FREET4 0.75 05/25/2020   FREET4 1.33 03/02/2020   She has inflammatory myositis, 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 Free  T4 08/29/2020 0.68  0.60 - 1.60 ng/dL Final   Comment: Specimens from patients who are undergoing biotin therapy and /or ingesting biotin supplements may contain high levels of biotin.  The higher biotin concentration in these specimens interferes with this Free T4 assay.  Specimens that contain high levels  of biotin may cause false high results for this Free T4 assay.  Please interpret results in light of the total clinical presentation of the patient.    Marland Kitchen TSH 08/29/2020 18.03* 0.35 - 4.50 uIU/mL Final  . Sodium 08/29/2020 136  135 - 145 mEq/L Final  . Potassium 08/29/2020 4.0  3.5 - 5.1 mEq/L Final  . Chloride 08/29/2020 100  96 - 112 mEq/L Final  . CO2 08/29/2020 30  19 - 32 mEq/L Final  . Glucose, Bld 08/29/2020 75  70 - 99 mg/dL Final  . BUN 16/02/9603 12  6 - 23 mg/dL Final  . Creatinine, Ser 08/29/2020 0.87  0.40 - 1.20 mg/dL Final  . GFR 54/01/8118 72.17  >60.00 mL/min Final   Calculated using the CKD-EPI Creatinine Equation (2021)  . Calcium 08/29/2020 10.2  8.4 - 10.5 mg/dL Final     Examination:   BP 128/84   Pulse 76   Ht 5\' 7"  (1.702 m)   Wt 215 lb 6.4 oz (97.7 kg)   SpO2 99%   BMI 33.74 kg/m   Body mass index is 33.74 kg/m.    ASSESSMENT/ PLAN:    Diabetes type 2 with obesity:   A1c was 5.8 last  She is on Rybelsus 14 mg monotherapy  Blood sugars are excellent at home but she did not bring her meter Able to maintain weight and exercise very regularly  Will check A1c on her upcoming labs   HYPOTHYROIDISM: She is continuing on Synthroid which  is less expensive than Tirosint but for some reason appears to be needing progressively higher doses of supplementation For now we will keep her on Synthroid brand name but go up to 175 If she has more fluctuation in her levels will go back to Tirosint Also discussed that she can take her Synthroid in the morning first and then take Rybelsus 10 to 15 minutes later  There are no Patient Instructions on file for this visit.   09/03/2020, 12:18 PM

## 2020-09-17 ENCOUNTER — Encounter: Payer: Self-pay | Admitting: Endocrinology

## 2020-10-16 ENCOUNTER — Encounter: Payer: BC Managed Care – PPO | Admitting: Endocrinology

## 2020-10-16 ENCOUNTER — Other Ambulatory Visit: Payer: Self-pay

## 2020-10-16 DIAGNOSIS — E119 Type 2 diabetes mellitus without complications: Secondary | ICD-10-CM

## 2020-10-16 DIAGNOSIS — E890 Postprocedural hypothyroidism: Secondary | ICD-10-CM

## 2020-10-16 LAB — HEMOGLOBIN A1C: Hgb A1c MFr Bld: 6.3 % (ref 4.6–6.5)

## 2020-10-16 LAB — T4, FREE: Free T4: 0.88 ng/dL (ref 0.60–1.60)

## 2020-10-16 LAB — GLUCOSE, RANDOM: Glucose, Bld: 105 mg/dL — ABNORMAL HIGH (ref 70–99)

## 2020-10-16 LAB — TSH: TSH: 0.81 u[IU]/mL (ref 0.35–4.50)

## 2020-10-16 NOTE — Progress Notes (Signed)
This encounter was created in error - please disregard.

## 2020-10-23 NOTE — Progress Notes (Signed)
Patient ID: Andrea Delacruz, female   DOB: 05/28/1960, 60 y.o.   MRN: [id number]           Reason for Appointment: Endocrinology follow-up     History of Present Illness   DIABETES type 2, diagnosis date:  2018  Previous history:  There may have had prediabetes for some time before she was told to have diabetes and started on Metformin No details of her history are available She thinks her blood sugars were relatively well controlled with Metformin 500 mg twice daily.  However her last 2 A1c levels have been 7.2 and 7.3 and she has been followed by her PCP  Recent history:     Non-insulin hypoglycemic drugs: Rybelsus 14 mg daily     Side effects from medications:  Diarrhea from high dose metformin  Current diabetes management, blood sugar patterns and problems identified:  Her A1c is now 6.3 compared to 5.8   . She is taking 14 mg Rybelsus, previously on metformin also . Her meter and history indicate that she is checking her blood sugars only about once a week on an average . Lab glucose 105  . She is exercising irregularly at times but recently getting back into walking up to 3 miles . Also doing work out with a Psychologist, educational 3 days weekly . Her weight is however again higher since her last visit . No side effects like nausea with Rybelsus . She is taking this before breakfast as prescribed . Has not checked readings after dinner lately   Blood sugar readings from monitor download and review, recent range 85-141 with average 108 Previously 98-120           Dietician visit: Most recent: None     Weight control:  Wt Readings from Last 3 Encounters:  10/24/20 219 lb 2 oz (99.4 kg)  09/03/20 215 lb 6.4 oz (97.7 kg)  05/28/20 216 lb 9.6 oz (98.2 kg)         Diabetes labs:  Lab Results  Component Value Date   HGBA1C 6.3 10/16/2020   HGBA1C 5.8 (A) 05/28/2020   HGBA1C 6.5 03/02/2020   Lab Results  Component Value Date   CREATININE 0.87 08/29/2020     Hypothyroidism and other issues: See review of systems   Allergies as of 10/24/2020      Reactions   Atorvastatin Other (See Comments)   Other reaction(s): Other (See Comments) Muscle spasm      Medication List       Accurate as of October 24, 2020  8:32 AM. If you have any questions, ask your nurse or doctor.        STOP taking these medications   Insulin Pen Needle 31G X 5 MM Misc Stopped by: Reather Littler, MD     TAKE these medications   acetaminophen 500 MG tablet Commonly known as: TYLENOL Take 500 mg by mouth every 6 (six) hours as needed for mild pain or headache.   calcium carbonate 1250 (500 Ca) MG tablet Commonly known as: OS-CAL - dosed in mg of elemental calcium Take 1 tablet (500 mg of elemental calcium total) by mouth daily with breakfast.   cholecalciferol 25 MCG (1000 UNIT) tablet Commonly known as: VITAMIN D3 Take 1,000 Units by mouth daily.   ezetimibe 10 MG tablet Commonly known as: ZETIA Take 10 mg by mouth daily.   FreeStyle Libre 2 Sensor Misc 2 Devices by Does not apply route every 14 (fourteen) days.   hydrochlorothiazide 25  MG tablet Commonly known as: HYDRODIURIL Take 25 mg by mouth daily.   Klor-Con M20 20 MEQ tablet Generic drug: potassium chloride SA TAKE 1 TABLET BY MOUTH EVERY DAY   lisinopril 10 MG tablet Commonly known as: ZESTRIL Take 10 mg by mouth daily.   methotrexate 250 MG/10ML injection Inject into the skin.   mirabegron ER 50 MG Tb24 tablet Commonly known as: MYRBETRIQ Take 50 mg by mouth daily.   OneTouch Delica Lancets 33G Misc Use as directed to check blood sugar   OneTouch Verio test strip Generic drug: glucose blood USE AS DIRECTED ACCU CHECK GUIDE PREFFERD BY INSURANCE   RHEUMATE PO Take by mouth.   Rybelsus 14 MG Tabs Generic drug: Semaglutide TAKE 1 TABLET BY MOUTH EVERY DAY   Synthroid 175 MCG tablet Generic drug: levothyroxine Take 1 tablet (175 mcg total) by mouth daily before breakfast.        Allergies:  Allergies  Allergen Reactions  . Atorvastatin Other (See Comments)    Other reaction(s): Other (See Comments) Muscle spasm     Past Medical History:  Diagnosis Date  . Complication of anesthesia   . Diabetes mellitus without complication (HCC)   . Hypertension   . Obesity   . PONV (postoperative nausea and vomiting)   . Thyroid disease    Hyperactive then was treated with iodine.     Past Surgical History:  Procedure Laterality Date  . BREAST REDUCTION SURGERY  [id number]  . MUSCLE BIOPSY Right 07/15/2019   Procedure: MUSCLE BIOPSY RIGHT DELTOID AND RIGHT RECTUS FEMORIS MUSCLE;  Surgeon: Gaynelle Adu, MD;  Location: [facility];  Service: General;  Laterality: Right;    Family History  Problem Relation Age of Onset  . Stroke Mother   . Diabetes Mother   . Diabetes Father   . Diabetes Sister   . Hypertension Sister   . Hypertension Maternal Aunt   . Cancer Maternal Uncle   . Diabetes Paternal Aunt   . Diabetes Paternal Uncle   . Cancer Maternal Grandmother     Social History:  reports that she has never smoked. She has never used smokeless tobacco. She reports current alcohol use. She reports that she does not use drugs.  ROS  She has post ablative hypothyroidism Since about 08/2019 she was taking Tirosint up to , was not controlled with Synthroid  previously  More recently she is on Synthroid brand name  She has not felt any unusual fatigue, occasional she may feel tired  TSH which was again suppressed in 10/21 was progressively higher and up to 18 With increasing the dose to 175 mcg her labs are back to normal although she does not think she felt any better with the increased dosage   Lab Results  Component Value Date   TSH 0.81 10/16/2020   TSH 18.03 (H) 08/29/2020   TSH 5.24 (H) 05/25/2020   FREET4 0.88 10/16/2020   FREET4 0.68 08/29/2020   FREET4 0.75 05/25/2020   She has inflammatory myositis, 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  Latest known visit with results is:  Erroneous Encounter on 10/16/2020  Component Date Value Ref Range Status  . Glucose, Bld 10/16/2020 105* 70 - 99 mg/dL Final  . Hgb E5U MFr Bld 10/16/2020 6.3  4.6 - 6.5 % Final   Glycemic Control Guidelines for People with Diabetes:Non Diabetic:  <6%Goal of Therapy: <7%Additional Action Suggested:  >8%   . Free T4 10/16/2020 0.88  0.60 - 1.60 ng/dL Final   Comment: Specimens from patients who are undergoing biotin therapy and /or ingesting biotin supplements may contain high levels of biotin.  The higher biotin concentration in these specimens interferes with this Free T4 assay.  Specimens that contain high levels  of biotin may cause false high results for this Free T4 assay.  Please interpret results in light of the total clinical presentation of the patient.    Marland Kitchen TSH 10/16/2020 0.81  0.35 - 4.50 uIU/mL Final     Examination:   BP 128/82   Pulse 74   Ht 5\' 7"  (1.702 m)   Wt 219 lb 2 oz (99.4 kg)   SpO2 98%   BMI 34.32 kg/m   Body mass index is 34.32 kg/m.    ASSESSMENT/ PLAN:    Diabetes type 2 with obesity:   A1c was 5.8 last  She is on Rybelsus 14 mg monotherapy  Blood sugars are excellent at home She is trying to exercise and generally watch her diet but has difficulty losing weight still  Encouraged her to check more readings after meals and this may  help her be consistent with diet also   HYPOTHYROIDISM: She is taking her Synthroid 175 mcg with normalization of her TSH Difficult to assess her thyroid level based on symptoms alone She will continue the same dose and take it just before taking her Rybelsus We will follow-up in 3 months to make sure her levels are stable  There are no Patient Instructions on file for this visit.   10/24/2020, 8:32 AM

## 2020-10-24 ENCOUNTER — Other Ambulatory Visit: Payer: Self-pay

## 2020-10-24 ENCOUNTER — Encounter: Payer: Self-pay | Admitting: Endocrinology

## 2020-10-24 ENCOUNTER — Ambulatory Visit (INDEPENDENT_AMBULATORY_CARE_PROVIDER_SITE_OTHER): Payer: BC Managed Care – PPO | Admitting: Endocrinology

## 2020-10-24 VITALS — BP 128/82 | HR 74 | Ht 67.0 in | Wt 219.1 lb

## 2020-10-24 DIAGNOSIS — E119 Type 2 diabetes mellitus without complications: Secondary | ICD-10-CM

## 2020-10-24 DIAGNOSIS — E890 Postprocedural hypothyroidism: Secondary | ICD-10-CM

## 2020-10-24 NOTE — Patient Instructions (Signed)
Check blood sugars on waking up 2-3 days a week  Also check blood sugars about 2 hours after meals and do this after different meals by rotation  Recommended blood sugar levels on waking up are 90-130 and about 2 hours after meal is 130-160  Please bring your blood sugar monitor to each visit, thank you   

## 2020-11-13 ENCOUNTER — Other Ambulatory Visit: Payer: Self-pay | Admitting: Family Medicine

## 2020-11-13 DIAGNOSIS — Z1231 Encounter for screening mammogram for malignant neoplasm of breast: Secondary | ICD-10-CM

## 2020-11-21 ENCOUNTER — Other Ambulatory Visit: Payer: Self-pay | Admitting: Endocrinology

## 2020-11-22 ENCOUNTER — Ambulatory Visit
Admission: RE | Admit: 2020-11-22 | Discharge: 2020-11-22 | Disposition: A | Discharge status: Home / Self Care | Payer: BC Managed Care – PPO | Source: Ambulatory Visit | Attending: Family Medicine | Admitting: Family Medicine

## 2020-11-22 ENCOUNTER — Other Ambulatory Visit: Payer: Self-pay

## 2020-11-22 DIAGNOSIS — Z1231 Encounter for screening mammogram for malignant neoplasm of breast: Secondary | ICD-10-CM

## 2021-01-08 ENCOUNTER — Ambulatory Visit: Payer: BC Managed Care – PPO

## 2021-01-24 ENCOUNTER — Other Ambulatory Visit: Payer: Self-pay

## 2021-01-24 ENCOUNTER — Other Ambulatory Visit (INDEPENDENT_AMBULATORY_CARE_PROVIDER_SITE_OTHER): Payer: BC Managed Care – PPO

## 2021-01-24 DIAGNOSIS — E890 Postprocedural hypothyroidism: Secondary | ICD-10-CM

## 2021-01-24 DIAGNOSIS — E119 Type 2 diabetes mellitus without complications: Secondary | ICD-10-CM | POA: Diagnosis not present

## 2021-01-24 LAB — HEMOGLOBIN A1C: Hgb A1c MFr Bld: 6.3 % (ref 4.6–6.5)

## 2021-01-24 LAB — GLUCOSE, RANDOM: Glucose, Bld: 99 mg/dL (ref 70–99)

## 2021-01-24 LAB — TSH: TSH: 0.07 u[IU]/mL — ABNORMAL LOW (ref 0.35–5.50)

## 2021-01-24 LAB — T4, FREE: Free T4: 1.2 ng/dL (ref 0.60–1.60)

## 2021-01-31 ENCOUNTER — Encounter: Payer: Self-pay | Admitting: Endocrinology

## 2021-01-31 ENCOUNTER — Ambulatory Visit: Payer: BC Managed Care – PPO | Admitting: Endocrinology

## 2021-01-31 ENCOUNTER — Other Ambulatory Visit: Payer: Self-pay

## 2021-01-31 VITALS — BP 144/80 | HR 80 | Ht 67.0 in | Wt 206.4 lb

## 2021-01-31 DIAGNOSIS — E890 Postprocedural hypothyroidism: Secondary | ICD-10-CM | POA: Diagnosis not present

## 2021-01-31 DIAGNOSIS — E119 Type 2 diabetes mellitus without complications: Secondary | ICD-10-CM | POA: Diagnosis not present

## 2021-01-31 MED ORDER — SYNTHROID 150 MCG PO TABS: 150.0000 ug | ORAL_TABLET | Freq: Every day | ORAL | 2 refills | Status: DC

## 2021-01-31 NOTE — Progress Notes (Signed)
Patient ID: Andrea Delacruz, female   DOB: August 29, 1960, 59 y.o.   MRN: [id number]           Reason for Appointment: Endocrinology follow-up     History of Present Illness   DIABETES type 2, diagnosis date:  2018  Previous history:  There may have had prediabetes for some time before she was told to have diabetes and started on Metformin No details of her history are available She thinks her blood sugars were relatively well controlled with Metformin 500 mg twice daily.  However her last 2 A1c levels have been 7.2 and 7.3 and she has been followed by her PCP  Recent history:     Non-insulin hypoglycemic drugs: Rybelsus 14 mg daily     Side effects from medications:  Diarrhea from high dose metformin  Current diabetes management, blood sugar patterns and problems identified:  Her A1c is again 6.3  She is taking 14 mg Rybelsus daily, previously on metformin also Her blood sugar meter was not downloaded as she is likely not taking enough readings now  However she has increased her exercise to 5 days a week up to 5 miles walking Has lost about 13 pounds since her last visit Lab glucose 99 No side effects including nausea with Rybelsus She is taking this before breakfast as prescribed Mostly checks readings in the mornings at home            Dietician visit: Most recent: None     Weight control:  Wt Readings from Last 3 Encounters:  01/31/21 206 lb 6.4 oz (93.6 kg)  10/24/20 219 lb 2 oz (99.4 kg)  09/03/20 215 lb 6.4 oz (97.7 kg)         Diabetes labs:  Lab Results  Component Value Date   HGBA1C 6.3 01/24/2021   HGBA1C 6.3 10/16/2020   HGBA1C 5.8 (A) 05/28/2020   Lab Results  Component Value Date   CREATININE 0.87 08/29/2020    Hypothyroidism and other issues: See review of systems   Allergies as of 01/31/2021       Reactions   Atorvastatin Other (See Comments)   Other reaction(s): Other (See Comments) Muscle spasm        Medication List         Accurate as of January 31, 2021  9:25 AM. If you have any questions, ask your nurse or doctor.          acetaminophen 500 MG tablet Commonly known as: TYLENOL Take 500 mg by mouth every 6 (six) hours as needed for mild pain or headache.   calcium carbonate 1250 (500 Ca) MG tablet Commonly known as: OS-CAL - dosed in mg of elemental calcium Take 1 tablet (500 mg of elemental calcium total) by mouth daily with breakfast.   cholecalciferol 25 MCG (1000 UNIT) tablet Commonly known as: VITAMIN D3 Take 1,000 Units by mouth daily.   ezetimibe 10 MG tablet Commonly known as: ZETIA Take 10 mg by mouth daily.   FreeStyle Libre 2 Sensor Misc 2 Devices by Does not apply route every 14 (fourteen) days.   Gemtesa 75 MG Tabs Generic drug: Vibegron Take 75 mg by mouth daily.   hydrochlorothiazide 25 MG tablet Commonly known as: HYDRODIURIL Take 25 mg by mouth daily.   Klor-Con M20 20 MEQ tablet Generic drug: potassium chloride SA TAKE 1 TABLET BY MOUTH EVERY DAY   lisinopril 10 MG tablet Commonly known as: ZESTRIL Take 10 mg by mouth daily.   methotrexate  250 MG/10ML injection Inject into the skin.   mirabegron ER 50 MG Tb24 tablet Commonly known as: MYRBETRIQ Take 50 mg by mouth daily.   OneTouch Delica Lancets 33G Misc Use as directed to check blood sugar   OneTouch Verio test strip Generic drug: glucose blood USE AS DIRECTED ACCU CHECK GUIDE PREFFERD BY INSURANCE   RHEUMATE PO Take by mouth.   Rybelsus 14 MG Tabs Generic drug: Semaglutide TAKE 1 TABLET BY MOUTH EVERY DAY   Synthroid 175 MCG tablet Generic drug: levothyroxine TAKE 1 TABLET BY MOUTH DAILY BEFORE BREAKFAST.        Allergies:  Allergies  Allergen Reactions   Atorvastatin Other (See Comments)    Other reaction(s): Other (See Comments) Muscle spasm     Past Medical History:  Diagnosis Date   Complication of anesthesia    Diabetes mellitus without complication (HCC)    Hypertension     Obesity    PONV (postoperative nausea and vomiting)    Thyroid disease    Hyperactive then was treated with iodine.     Past Surgical History:  Procedure Laterality Date   BREAST REDUCTION SURGERY  [id number]   MUSCLE BIOPSY Right 07/15/2019   Procedure: MUSCLE BIOPSY RIGHT DELTOID AND RIGHT RECTUS FEMORIS MUSCLE;  Surgeon: Gaynelle Adu, MD;  Location: [facility] OR;  Service: General;  Laterality: Right;   REDUCTION MAMMAPLASTY Bilateral    Patient states 20 years ago    Family History  Problem Relation Age of Onset   Stroke Mother    Diabetes Mother    Diabetes Father    Diabetes Sister    Hypertension Sister    Hypertension Maternal Aunt    Cancer Maternal Uncle    Diabetes Paternal Aunt    Diabetes Paternal Uncle    Cancer Maternal Grandmother     Social History:  reports that she has never smoked. She has never used smokeless tobacco. She reports current alcohol use. She reports that she does not use drugs.  ROS  She has post ablative hypothyroidism Since about 08/2019 she was taking Tirosint up to , was not controlled with Synthroid  previously  Now she is on Synthroid, brand name  She has not felt any unusual fatigue Has lost weight from lifestyle changes likely  TSH which was back to normal but 175 mcg is now suppressed  She has not changed the way she takes her Synthroid in the mornings before eating  Recently no tremor or shakiness and no palpitations   Lab Results  Component Value Date   TSH 0.07 (L) 01/24/2021   TSH 0.81 10/16/2020   TSH 18.03 (H) 08/29/2020   FREET4 1.20 01/24/2021   FREET4 0.88 10/16/2020   FREET4 0.68 08/29/2020    HISTORY of inflammatory myositis, 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  Latest known visit with results is:  Lab on 01/24/2021  Component Date Value Ref Range Status   Glucose, Bld 01/24/2021 99  70 -  99 mg/dL Final   Hgb S8P MFr Bld 01/24/2021 6.3  4.6 - 6.5 % Final   Glycemic Control Guidelines for People with Diabetes:Non Diabetic:  <6%Goal of Therapy: <7%Additional Action Suggested:  >8%    Free T4 01/24/2021 1.20  0.60 - 1.60 ng/dL Final   Comment: Specimens from patients who are undergoing biotin therapy and /or ingesting biotin supplements may contain high levels of biotin.  The higher biotin concentration in these specimens interferes with this Free T4 assay.  Specimens that contain high levels  of biotin may cause false high results for this Free T4 assay.  Please interpret results in light of the total clinical presentation of the patient.     TSH 01/24/2021 0.07 (A) 0.35 - 5.50 uIU/mL Final     Examination:   BP (!) 144/80   Pulse 80   Ht 5\' 7"  (1.702 m)   Wt 206 lb 6.4 oz (93.6 kg)   SpO2 99%   BMI 32.33 kg/m   Body mass index is 32.33 kg/m.    ASSESSMENT/ PLAN:    Diabetes type 2 with obesity:   A1c is stable at 6.3  She is on Rybelsus 14 mg monotherapy  Blood sugars are reportedly good at home but likely not checking enough and not after meals also She is able to lose significant amount of weight with increasing exercise and generally watch her diet   No changes to be made   HYPOTHYROIDISM: She is taking brand-name Synthroid 175 mcg  Even though her TSH was normal only 3 months ago it is now significantly suppressed although asymptomatic This is likely be from her significant weight loss Objectively looks euthyroid  She will go down to 150 mcg and follow-up in another 2  months  There are no Patient Instructions on file for this visit.   01/31/2021, 9:25 AM

## 2021-03-06 DIAGNOSIS — M65311 Trigger thumb, right thumb: Secondary | ICD-10-CM | POA: Insufficient documentation

## 2021-03-06 DIAGNOSIS — M67441 Ganglion, right hand: Secondary | ICD-10-CM | POA: Insufficient documentation

## 2021-03-29 ENCOUNTER — Other Ambulatory Visit (INDEPENDENT_AMBULATORY_CARE_PROVIDER_SITE_OTHER): Payer: BC Managed Care – PPO

## 2021-03-29 ENCOUNTER — Other Ambulatory Visit: Payer: Self-pay

## 2021-03-29 DIAGNOSIS — E119 Type 2 diabetes mellitus without complications: Secondary | ICD-10-CM

## 2021-03-29 DIAGNOSIS — E890 Postprocedural hypothyroidism: Secondary | ICD-10-CM | POA: Diagnosis not present

## 2021-03-29 LAB — GLUCOSE, RANDOM: Glucose, Bld: 122 mg/dL — ABNORMAL HIGH (ref 70–99)

## 2021-03-29 LAB — T4, FREE: Free T4: 1.13 ng/dL (ref 0.60–1.60)

## 2021-03-29 LAB — TSH: TSH: 0.76 u[IU]/mL (ref 0.35–5.50)

## 2021-04-02 ENCOUNTER — Ambulatory Visit (INDEPENDENT_AMBULATORY_CARE_PROVIDER_SITE_OTHER): Payer: BC Managed Care – PPO | Admitting: Endocrinology

## 2021-04-02 ENCOUNTER — Other Ambulatory Visit: Payer: Self-pay

## 2021-04-02 VITALS — BP 142/100 | HR 72 | Ht 67.0 in | Wt 212.0 lb

## 2021-04-02 DIAGNOSIS — E119 Type 2 diabetes mellitus without complications: Secondary | ICD-10-CM | POA: Diagnosis not present

## 2021-04-02 DIAGNOSIS — Z23 Encounter for immunization: Secondary | ICD-10-CM

## 2021-04-02 DIAGNOSIS — E890 Postprocedural hypothyroidism: Secondary | ICD-10-CM

## 2021-04-02 NOTE — Progress Notes (Signed)
Patient ID: Andrea Delacruz, female   DOB: 1961-04-22, 60 y.o.   MRN: [id number]           Reason for Appointment: Endocrinology follow-up     History of Present Illness   DIABETES type 2, diagnosis date:  2018  Previous history:  There may have had prediabetes for some time before she was told to have diabetes and started on Metformin No details of her history are available She thinks her blood sugars were relatively well controlled with Metformin 500 mg twice daily.  However her last 2 A1c levels have been 7.2 and 7.3 and she has been followed by her PCP  Recent history:     Non-insulin hypoglycemic drugs: Rybelsus 14 mg daily     Side effects from medications:  Diarrhea from high dose metformin  Current diabetes management, blood sugar patterns and problems identified:  Her A1c is last 6.3  Meter could not be downloaded today Lab glucose monitoring due to fasting She thinks her morning readings are usually 120 or so and may also check before dinner She is continuing her exercise She thinks she has not reduced her exercise but not clear why her weight is up 6 pounds Asking about Ozempic            Dietician visit: Most recent: None     Weight control:  Wt Readings from Last 3 Encounters:  04/02/21 212 lb (96.2 kg)  01/31/21 206 lb 6.4 oz (93.6 kg)  10/24/20 219 lb 2 oz (99.4 kg)         Diabetes labs:  Lab Results  Component Value Date   HGBA1C 6.3 01/24/2021   HGBA1C 6.3 10/16/2020   HGBA1C 5.8 (A) 05/28/2020   Lab Results  Component Value Date   CREATININE 0.87 08/29/2020    Hypothyroidism and other issues: See review of systems   Allergies as of 04/02/2021       Reactions   Atorvastatin Other (See Comments)   Other reaction(s): Other (See Comments) Muscle spasm        Medication List        Accurate as of April 02, 2021 10:22 AM. If you have any questions, ask your nurse or doctor.          acetaminophen 500 MG tablet Commonly  known as: TYLENOL Take 500 mg by mouth every 6 (six) hours as needed for mild pain or headache.   calcium carbonate 1250 (500 Ca) MG tablet Commonly known as: OS-CAL - dosed in mg of elemental calcium Take 1 tablet (500 mg of elemental calcium total) by mouth daily with breakfast.   cholecalciferol 25 MCG (1000 UNIT) tablet Commonly known as: VITAMIN D3 Take 1,000 Units by mouth daily.   ezetimibe 10 MG tablet Commonly known as: ZETIA Take 10 mg by mouth daily.   FreeStyle Libre 2 Sensor Misc 2 Devices by Does not apply route every 14 (fourteen) days.   Gemtesa 75 MG Tabs Generic drug: Vibegron Take 75 mg by mouth daily.   hydrochlorothiazide 25 MG tablet Commonly known as: HYDRODIURIL Take 25 mg by mouth daily.   Klor-Con M20 20 MEQ tablet Generic drug: potassium chloride SA TAKE 1 TABLET BY MOUTH EVERY DAY   lisinopril 10 MG tablet Commonly known as: ZESTRIL Take 10 mg by mouth daily.   methotrexate 250 MG/10ML injection Inject into the skin.   mirabegron ER 50 MG Tb24 tablet Commonly known as: MYRBETRIQ Take 50 mg by mouth daily.   OneTouch  Delica Lancets 33G Misc Use as directed to check blood sugar   OneTouch Verio test strip Generic drug: glucose blood USE AS DIRECTED ACCU CHECK GUIDE PREFFERD BY INSURANCE   RHEUMATE PO Take by mouth.   Rybelsus 14 MG Tabs Generic drug: Semaglutide TAKE 1 TABLET BY MOUTH EVERY DAY   Synthroid 150 MCG tablet Generic drug: levothyroxine Take 1 tablet (150 mcg total) by mouth daily before breakfast.        Allergies:  Allergies  Allergen Reactions   Atorvastatin Other (See Comments)    Other reaction(s): Other (See Comments) Muscle spasm     Past Medical History:  Diagnosis Date   Complication of anesthesia    Diabetes mellitus without complication (HCC)    Hypertension    Obesity    PONV (postoperative nausea and vomiting)    Thyroid disease    Hyperactive then was treated with iodine.     Past  Surgical History:  Procedure Laterality Date   BREAST REDUCTION SURGERY  [id number]   MUSCLE BIOPSY Right 07/15/2019   Procedure: MUSCLE BIOPSY RIGHT DELTOID AND RIGHT RECTUS FEMORIS MUSCLE;  Surgeon: Gaynelle Adu, MD;  Location: [facility] OR;  Service: General;  Laterality: Right;   REDUCTION MAMMAPLASTY Bilateral    Patient states 20 years ago    Family History  Problem Relation Age of Onset   Stroke Mother    Diabetes Mother    Diabetes Father    Diabetes Sister    Hypertension Sister    Hypertension Maternal Aunt    Cancer Maternal Uncle    Diabetes Paternal Aunt    Diabetes Paternal Uncle    Cancer Maternal Grandmother     Social History:  reports that she has never smoked. She has never used smokeless tobacco. She reports current alcohol use. She reports that she does not use drugs.  ROS  She has post ablative hypothyroidism Since about 08/2019 she was taking Tirosint up to , was not controlled with Synthroid  previously  Now she is on Synthroid, brand name  She is not taking 150 mcg since 01/2021  TSH which was suppressed is now back to normal She did not feel any difference with the change in dose on the last visit  As before she is consistent with taking her Synthroid brand name by empty stomach in the morning No unusual fatigue  Lab Results  Component Value Date   TSH 0.76 03/29/2021   TSH 0.07 (L) 01/24/2021   TSH 0.81 10/16/2020   FREET4 1.13 03/29/2021   FREET4 1.20 01/24/2021   FREET4 0.88 10/16/2020    HISTORY of inflammatory myositis, 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, was on prednisone along with methotrexate injectable with improvement in symptoms Continues to be managed with taking methotrexate injections and no prednisone   HYPERTENSION treated with HCTZ 12.5 mg by PCP as also lisinopril She has a BP monitor at home: Blood pressure usually around 102/80  BP Readings from Last 3 Encounters:  04/02/21 (!) 142/100  01/31/21 (!) 144/80  10/24/20 128/82     LIPIDS: Not on any treatment Last LDL was 145 followed by PCP  LABS:  Lab on 03/29/2021  Component Date Value Ref Range Status   Glucose, Bld 03/29/2021 122 (A)  70 - 99 mg/dL Final   TSH 15/40/0867 0.76  0.35 - 5.50 uIU/mL Final   Free T4 03/29/2021 1.13  0.60 - 1.60 ng/dL Final   Comment: Specimens from patients who are undergoing biotin therapy and /or ingesting biotin supplements may  contain high levels of biotin.  The higher biotin concentration in these specimens interferes with this Free T4 assay.  Specimens that contain high levels  of biotin may cause false high results for this Free T4 assay.  Please interpret results in light of the total clinical presentation of the patient.       Examination:   BP (!) 142/100   Pulse 72   Ht 5\' 7"  (1.702 m)   Wt 212 lb (96.2 kg)   SpO2 99%   BMI 33.20 kg/m   Body mass index is 33.2 kg/m.    ASSESSMENT/ PLAN:    HYPOTHYROIDISM: She is taking brand-name Synthroid 150 mcg  Now needing a little less dosage possibly from her weight loss Although her weight appears a little higher her TSH is back to normal She has not subjectively felt any different Than before consistent with her Synthroid and will continue the same dose   Diabetes type 2 with obesity:   A1c is usually below 6.5  She is on Rybelsus 14 mg monotherapy Discussed that if she wants to do Ozempic she will have to take injections but she prefers to stay with oral medications She will continue her excellent lifestyle and also check some readings after meals instead of just before Follow-up in 4 months Flu vaccine given   Patient Instructions  Check blood sugars on waking up 2-3 days a week  Also check blood sugars about 2 hours after meals and do this after different meals by rotation  Recommended blood sugar levels on waking up are 90-130 and about 2 hours after meal is 130-160  Please bring your blood sugar monitor to each visit, thank you   04/02/2021, 10:22 AM

## 2021-04-02 NOTE — Patient Instructions (Signed)
Check blood sugars on waking up 2-3 days a week  Also check blood sugars about 2 hours after meals and do this after different meals by rotation  Recommended blood sugar levels on waking up are 90-130 and about 2 hours after meal is 130-160  Please bring your blood sugar monitor to each visit, thank you   

## 2021-05-10 ENCOUNTER — Other Ambulatory Visit: Payer: Self-pay | Admitting: Endocrinology

## 2021-06-21 ENCOUNTER — Other Ambulatory Visit: Payer: Self-pay | Admitting: Endocrinology

## 2021-07-08 IMAGING — MG MM DIGITAL SCREENING BILAT W/ TOMO AND CAD
8 series · 9 of 24 positions shown · non-contrast
Comparison: Previous exam(s).

CLINICAL DATA: Screening.

EXAM:
DIGITAL SCREENING BILATERAL MAMMOGRAM WITH TOMOSYNTHESIS AND CAD
TECHNIQUE: Bilateral screening digital craniocaudal and mediolateral oblique
mammograms were obtained. Bilateral screening digital breast
tomosynthesis was performed. The images were evaluated with
computer-aided detection.

[R CC synth-2D]
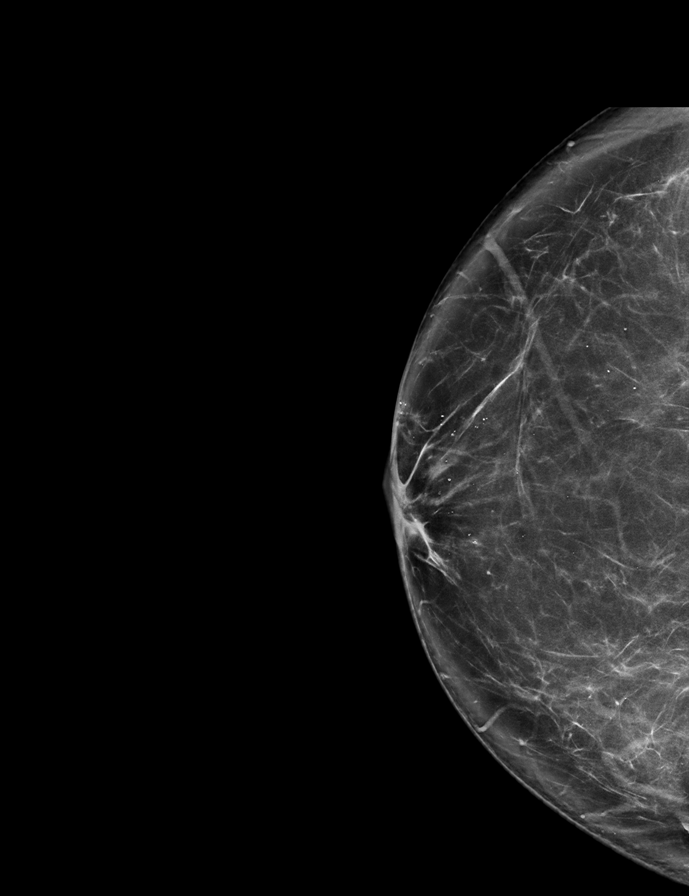

[R MLO synth-2D]
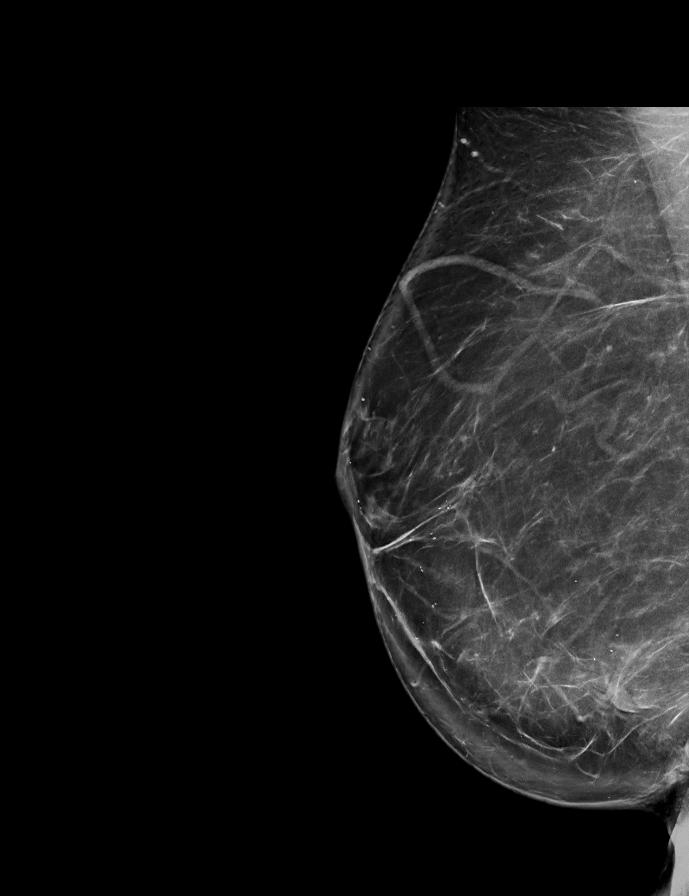

[L MLO synth-2D]
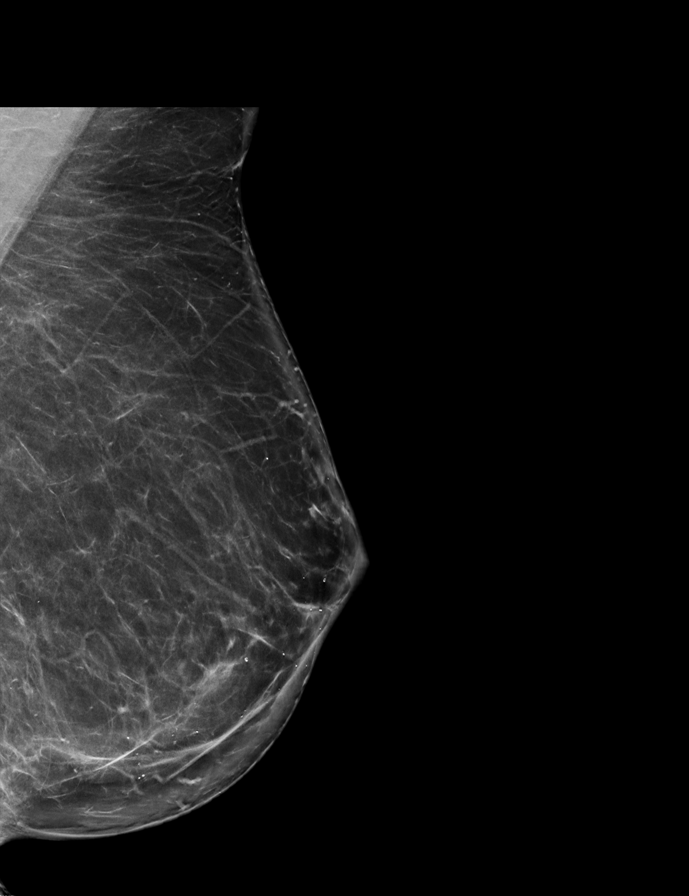

[L CC synth-2D]
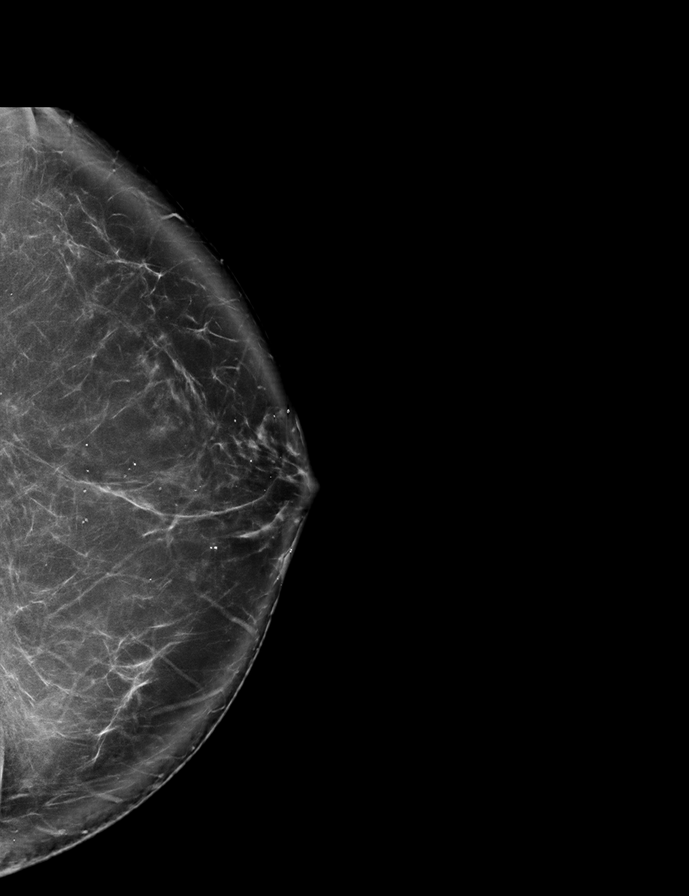

[R MLO tomo · 2 of 93 frames shown]
[frame 30/93]
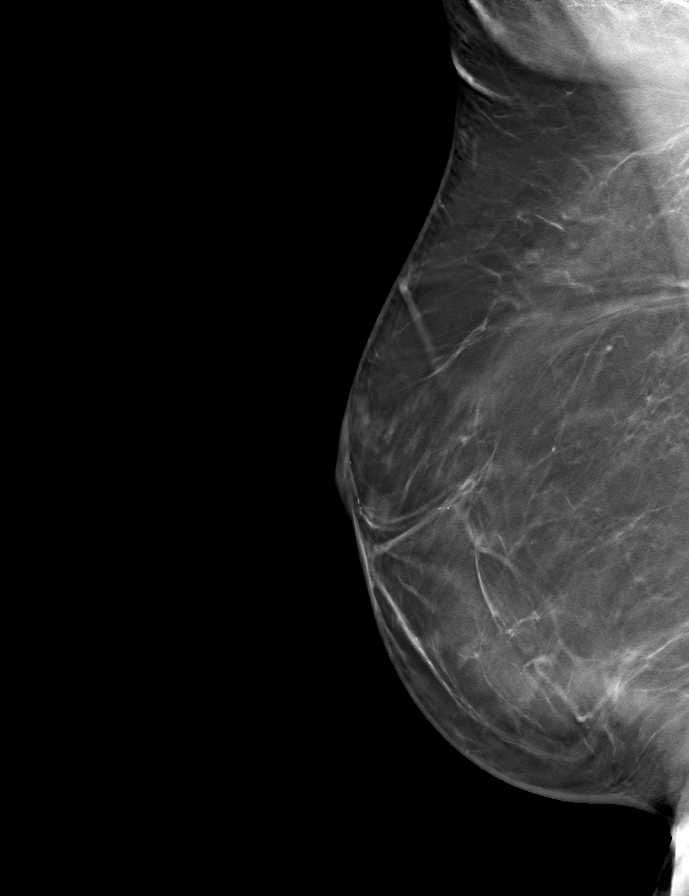
[frame 47/93]
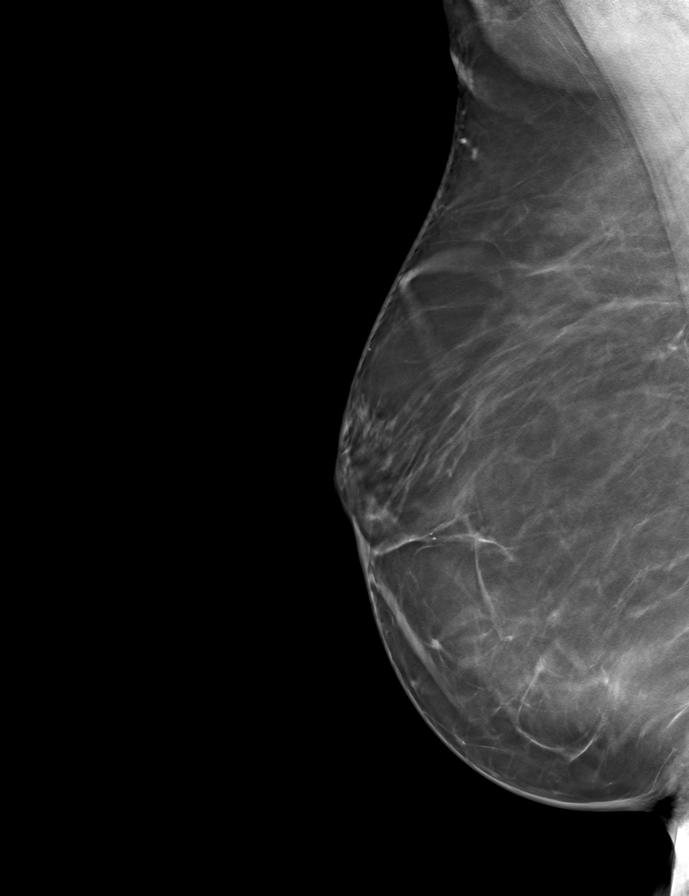

[L MLO tomo · tomo slice 43/86.0]
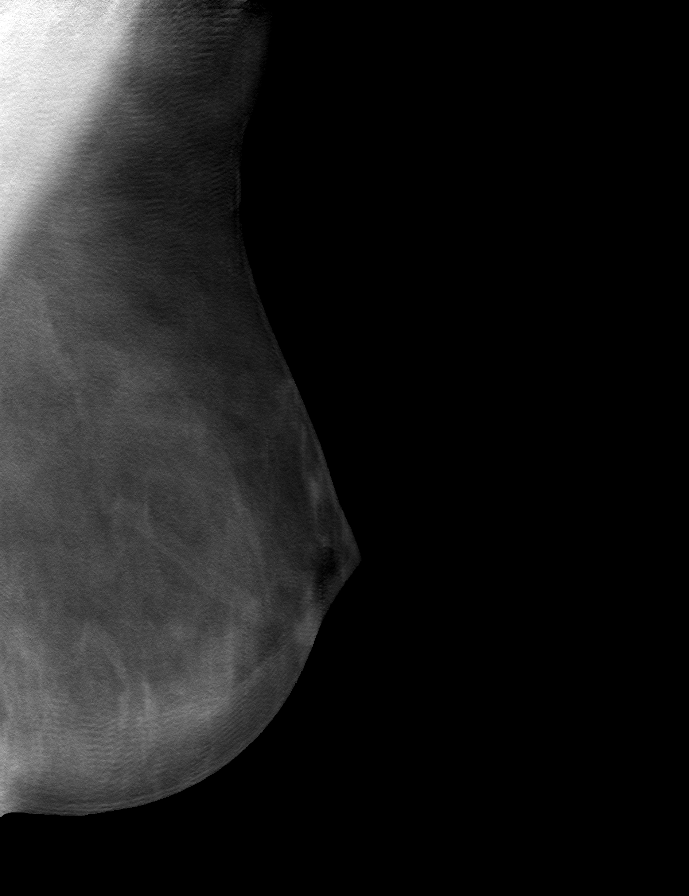

[L CC tomo · tomo slice 49/96.0]
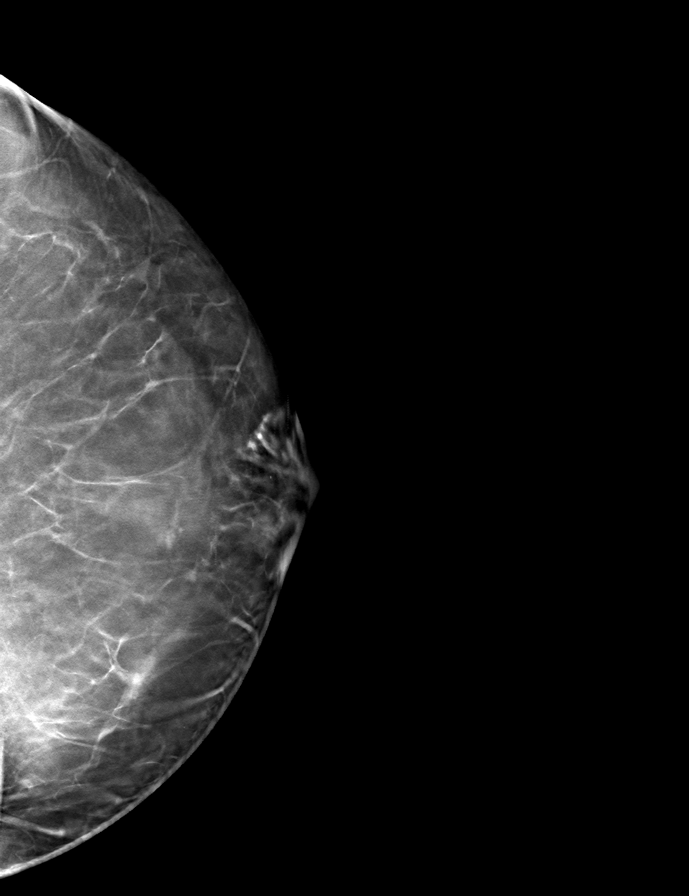

[R CC tomo · tomo slice 43/86.0]
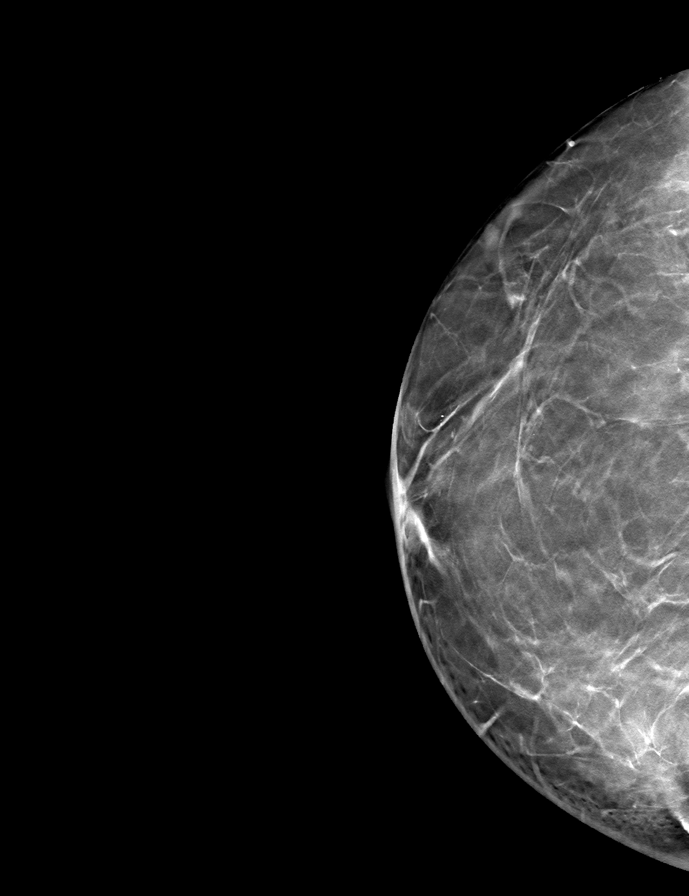

[9 of 24 positions shown; findings below may reference images not displayed]

ACR Breast Density Category b: There are scattered areas of
fibroglandular density.
FINDINGS: There are no findings suspicious for malignancy.
IMPRESSION: No mammographic evidence of malignancy. A result letter of this
screening mammogram will be mailed directly to the patient.

RECOMMENDATION:
Screening mammogram in one year. (Code:51-O-LD2)

BI-RADS CATEGORY  1: Negative.

## 2021-07-29 ENCOUNTER — Other Ambulatory Visit (INDEPENDENT_AMBULATORY_CARE_PROVIDER_SITE_OTHER): Payer: BC Managed Care – PPO

## 2021-07-29 ENCOUNTER — Other Ambulatory Visit: Payer: Self-pay

## 2021-07-29 DIAGNOSIS — E890 Postprocedural hypothyroidism: Secondary | ICD-10-CM

## 2021-07-29 DIAGNOSIS — E119 Type 2 diabetes mellitus without complications: Secondary | ICD-10-CM

## 2021-07-29 LAB — BASIC METABOLIC PANEL
BUN: 11 mg/dL (ref 6–23)
CO2: 29 mEq/L (ref 19–32)
Calcium: 10.2 mg/dL (ref 8.4–10.5)
Chloride: 100 mEq/L (ref 96–112)
Creatinine, Ser: 1 mg/dL (ref 0.40–1.20)
GFR: 60.67 mL/min (ref >60.00)
Glucose, Bld: 105 mg/dL — ABNORMAL HIGH (ref 70–99)
Potassium: 4.1 mEq/L (ref 3.5–5.1)
Sodium: 138 mEq/L (ref 135–145)

## 2021-07-29 LAB — TSH: TSH: 17.93 u[IU]/mL — ABNORMAL HIGH (ref 0.35–5.50)

## 2021-07-29 LAB — MICROALBUMIN / CREATININE URINE RATIO
Creatinine,U: 249.5 mg/dL
Microalb Creat Ratio: 0.4 mg/g (ref 0.0–30.0)
Microalb, Ur: 1 mg/dL (ref 0.0–1.9)

## 2021-07-29 LAB — HEMOGLOBIN A1C: Hgb A1c MFr Bld: 6.7 % — ABNORMAL HIGH (ref 4.6–6.5)

## 2021-07-29 LAB — T4, FREE: Free T4: 0.76 ng/dL (ref 0.60–1.60)

## 2021-07-30 ENCOUNTER — Encounter: Payer: Self-pay | Admitting: Endocrinology

## 2021-07-30 ENCOUNTER — Ambulatory Visit: Payer: BC Managed Care – PPO | Admitting: Endocrinology

## 2021-07-31 ENCOUNTER — Ambulatory Visit: Payer: BC Managed Care – PPO | Admitting: Endocrinology

## 2021-07-31 ENCOUNTER — Encounter: Payer: Self-pay | Admitting: Endocrinology

## 2021-07-31 ENCOUNTER — Other Ambulatory Visit: Payer: Self-pay

## 2021-07-31 VITALS — BP 110/70 | HR 76 | Ht 67.0 in | Wt 218.4 lb

## 2021-07-31 DIAGNOSIS — E890 Postprocedural hypothyroidism: Secondary | ICD-10-CM

## 2021-07-31 DIAGNOSIS — E119 Type 2 diabetes mellitus without complications: Secondary | ICD-10-CM | POA: Diagnosis not present

## 2021-07-31 MED ORDER — SYNTHROID 175 MCG PO TABS: ORAL_TABLET | ORAL | 1 refills | Status: DC

## 2021-07-31 NOTE — Progress Notes (Signed)
Patient ID: Andrea Delacruz, female   DOB: [birth year], 61 y.o.   MRN: [id number] ? ?       ? ? ?Reason for Appointment: Endocrinology follow-up ? ?  ? ?History of Present Illness  ? ?DIABETES type 2, diagnosis date:  2018 ? ?Previous history: ? ?There may have had prediabetes for some time before she was told to have diabetes and started on Metformin ?No details of her history are available ?She thinks her blood sugars were relatively well controlled with Metformin 500 mg twice daily.  However her last 2 A1c levels have been 7.2 and 7.3 and she has been followed by her PCP ? ?Recent history: ?    ?Non-insulin hypoglycemic drugs: Rybelsus 14 mg daily ?    ?Side effects from medications:  Diarrhea from high dose metformin ? ?Current diabetes management, blood sugar patterns and problems identified: ? ?Her A1c is 6.7 compared to 6.3 ? ?Although her blood sugars at home are not high they are being checked very infrequently and generally not after meals especially dinner ?Since she has had more fatigue and muscle weakness she has not had much exercise ?Her weight has gradually continued to go up since  9/22 ?She thinks her morning readings are usually 120 or so and may also check before dinner ?Lab fasting glucose was 105 ?She is taking her Rybelsus consistently as directed ? ?         ?Weight control: ? ?Wt Readings from Last 3 Encounters:  ?07/31/21 218 lb 6.4 oz (99.1 kg)  ?04/02/21 212 lb (96.2 kg)  ?01/31/21 206 lb 6.4 oz (93.6 kg)  ?      ? ?Diabetes labs: ? ?Lab Results  ?Component Value Date  ? HGBA1C 6.7 (H) 07/29/2021  ? HGBA1C 6.3 01/24/2021  ? HGBA1C 6.3 10/16/2020  ? ?Lab Results  ?Component Value Date  ? MICROALBUR 1.0 07/29/2021  ? CREATININE 1.00 07/29/2021  ? ? ?Hypothyroidism and other issues: See review of systems ? ? ?Allergies as of 07/31/2021   ? ?   Reactions  ? Atorvastatin Other (See Comments)  ? Other reaction(s): Other (See Comments) ?Muscle spasm  ? ?  ? ?  ?Medication List  ?  ? ?  ? Accurate  as of July 31, 2021 10:13 AM. If you have any questions, ask your nurse or doctor.  ?  ?  ? ?  ? ?acetaminophen 500 MG tablet ?Commonly known as: TYLENOL ?Take 500 mg by mouth every 6 (six) hours as needed for mild pain or headache. ?  ?calcium carbonate 1250 (500 Ca) MG tablet ?Commonly known as: OS-CAL - dosed in mg of elemental calcium ?Take 1 tablet (500 mg of elemental calcium total) by mouth daily with breakfast. ?  ?cholecalciferol 25 MCG (1000 UNIT) tablet ?Commonly known as: VITAMIN D3 ?Take 1,000 Units by mouth daily. ?  ?ezetimibe 10 MG tablet ?Commonly known as: ZETIA ?Take 10 mg by mouth daily. ?  ?FreeStyle Libre 2 Sensor Misc ?2 Devices by Does not apply route every 14 (fourteen) days. ?  ?Gemtesa 75 MG Tabs ?Generic drug: Vibegron ?Take 75 mg by mouth daily. ?  ?hydrochlorothiazide 25 MG tablet ?Commonly known as: HYDRODIURIL ?Take 25 mg by mouth daily. ?  ?Klor-Con M20 20 MEQ tablet ?Generic drug: potassium chloride SA ?TAKE 1 TABLET BY MOUTH EVERY DAY ?  ?lisinopril 10 MG tablet ?Commonly known as: ZESTRIL ?Take 10 mg by mouth daily. ?  ?methotrexate 250 MG/10ML injection ?Inject into the skin. ?  ?  mirabegron ER 50 MG Tb24 tablet ?Commonly known as: MYRBETRIQ ?Take 50 mg by mouth daily. ?  ?OneTouch Delica Lancets 33G Misc ?Use as directed to check blood sugar ?  ?OneTouch Verio test strip ?Generic drug: glucose blood ?USE AS DIRECTED ACCU CHECK GUIDE PREFFERD BY INSURANCE ?  ?RHEUMATE PO ?Take by mouth. ?  ?Rybelsus 14 MG Tabs ?Generic drug: Semaglutide ?TAKE 1 TABLET BY MOUTH EVERY DAY ?  ?Synthroid 150 MCG tablet ?Generic drug: levothyroxine ?TAKE 1 TABLET BY MOUTH DAILY BEFORE BREAKFAST. ?  ? ?  ? ? ?Allergies:  ?Allergies  ?Allergen Reactions  ? Atorvastatin Other (See Comments)  ?  Other reaction(s): Other (See Comments) ?Muscle spasm ?  ? ? ?Past Medical History:  ?Diagnosis Date  ? Complication of anesthesia   ? Diabetes mellitus without complication (HCC)   ? Hypertension   ? Obesity   ?  PONV (postoperative nausea and vomiting)   ? Thyroid disease   ? Hyperactive then was treated with iodine.   ? ? ?Past Surgical History:  ?Procedure Laterality Date  ? BREAST REDUCTION SURGERY  [id number]  ? MUSCLE BIOPSY Right 07/15/2019  ? Procedure: MUSCLE BIOPSY RIGHT DELTOID AND RIGHT RECTUS FEMORIS MUSCLE;  Surgeon: Gaynelle Adu, MD;  Location: [facility] OR;  Service: General;  Laterality: Right;  ? REDUCTION MAMMAPLASTY Bilateral   ? Patient states 20 years ago  ? ? ?Family History  ?Problem Relation Age of Onset  ? Stroke Mother   ? Diabetes Mother   ? Diabetes Father   ? Diabetes Sister   ? Hypertension Sister   ? Hypertension Maternal Aunt   ? Cancer Maternal Uncle   ? Diabetes Paternal Aunt   ? Diabetes Paternal Uncle   ? Cancer Maternal Grandmother   ? ? ?Social History:  reports that she has never smoked. She has never used smokeless tobacco. She reports current alcohol use. She reports that she does not use drugs. ? ?ROS ? ?She has post ablative hypothyroidism ?Since about 08/2019 she was taking Tirosint up to , was not controlled with Synthroid  previously ? ?Again she is on Synthroid, brand name  ?She is taking 150 mcg since 01/2021 ? ?Over the last month or so she has been more fatigued and lethargic and going to bed early in the evening ?No cold intolerance, change in her hair loss, skin texture or any edema ? ?TSH which was below goal is now about 18 ? ?As before she is consistent with taking her Synthroid brand name by empty stomach in the morning, usually taking her calcium after breakfast which is at least 30 minutes later.  Has not changed her routine from before ? ?Lab Results  ?Component Value Date  ? TSH 17.93 (H) 07/29/2021  ? TSH 0.76 03/29/2021  ? TSH 0.07 (L) 01/24/2021  ? FREET4 0.76 07/29/2021  ? FREET4 1.13 03/29/2021  ? FREET4 1.20 01/24/2021  ? ? 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 ? ?HYPERTENSION treated with HCTZ 12.5 mg by PCP as also lisinopril ?She has a BP monitor at home ? ?BP Readings from Last 3 Encounters:  ?07/31/21 110/70  ?04/02/21 (!) 142/100  ?01/31/21 (!) 144/80  ? ? ?LIPIDS: Not on any treatment ?Last LDL was 145 followed by PCP ? ?LABS: ? ?Lab on 07/29/2021  ?Component Date Value Ref  Range Status  ? Microalb, Ur 07/29/2021 1.0  0.0 - 1.9 mg/dL Final  ? Creatinine,U 96/04/540903/13/2023 249.5  mg/dL Final  ? Microalb Creat Ratio 07/29/2021 0.4  0.0 - 30.0 mg/g Final  ? TSH 07/29/2021 17.93 (H)  0.35 - 5.50 uIU/mL Final  ? Free T4 07/29/2021 0.76  0.60 - 1.60 ng/dL Final  ? Comment: Specimens from patients who are undergoing biotin therapy and /or ingesting biotin supplements may contain high levels of biotin.  The higher biotin concentration in these specimens interferes with this Free T4 assay.  Specimens that contain high levels  ?of biotin may cause false high results for this Free T4 assay.  Please interpret results in light of the total clinical presentation of the patient.  ?  ? Sodium 07/29/2021 138  135 - 145 mEq/L Final  ? Potassium 07/29/2021 4.1  3.5 - 5.1 mEq/L Final  ? Chloride 07/29/2021 100  96 - 112 mEq/L Final  ? CO2 07/29/2021 29  19 - 32 mEq/L Final  ? Glucose, Bld 07/29/2021 105 (H)  70 - 99 mg/dL Final  ? BUN 81/19/147803/13/2023 11  6 - 23 mg/dL Final  ? Creatinine, Ser 07/29/2021 1.00  0.40 - 1.20 mg/dL Final  ? GFR 29/56/213003/13/2023 60.67  >60.00 mL/min Final  ? Calculated using the CKD-EPI Creatinine Equation (2021)  ? Calcium 07/29/2021 10.2  8.4 - 10.5 mg/dL Final  ? Hgb Q6VA1c MFr Bld 07/29/2021 6.7 (H)  4.6 - 6.5 % Final  ? Glycemic Control Guidelines for People with Diabetes:Non Diabetic:  <6%Goal of Therapy: <7%Additional Action Suggested:  >8%   ? ? ? Examination: ?  ?BP 110/70   Pulse 76   Ht 5\' 7"  (1.702 m)   Wt 218 lb 6.4 oz (99.1 kg)   SpO2 98%   BMI 34.21 kg/m?   Body mass  index is 34.21 kg/m?.  ? ? ?ASSESSMENT/ PLAN:  ? ? ?HYPOTHYROIDISM: ?She is taking brand-name Synthroid 150 mcg  ?Given that she is consistent with her Synthroid before eating and has not taken any interacting

## 2021-07-31 NOTE — Patient Instructions (Addendum)
Check blood sugars on waking up 3 days a week ? ?Also check blood sugars about 2 hours after meals and do this after different meals by rotation ? ?Recommended blood sugar levels on waking up are 90-120 and about 2 hours after meal is 130-160 ? ?Please bring your blood sugar monitor to each visit, thank you ? ?Calcium at lunch or dinner ? ?

## 2021-08-02 ENCOUNTER — Ambulatory Visit: Payer: BC Managed Care – PPO | Admitting: Endocrinology

## 2021-08-05 ENCOUNTER — Ambulatory Visit: Payer: BC Managed Care – PPO | Admitting: Endocrinology

## 2021-08-12 ENCOUNTER — Ambulatory Visit: Payer: BC Managed Care – PPO | Admitting: Endocrinology

## 2021-09-04 ENCOUNTER — Encounter: Payer: Self-pay | Admitting: Endocrinology

## 2021-09-04 ENCOUNTER — Other Ambulatory Visit: Payer: Self-pay | Admitting: Endocrinology

## 2021-09-04 MED ORDER — OZEMPIC (0.25 OR 0.5 MG/DOSE) 2 MG/3ML ~~LOC~~ SOPN: 0.5000 mg | PEN_INJECTOR | SUBCUTANEOUS | 0 refills | Status: DC

## 2021-09-11 ENCOUNTER — Other Ambulatory Visit: Payer: Self-pay | Admitting: Nurse Practitioner

## 2021-09-11 ENCOUNTER — Other Ambulatory Visit (HOSPITAL_COMMUNITY)
Admission: RE | Admit: 2021-09-11 | Discharge: 2021-09-11 | Disposition: A | Discharge status: Home / Self Care | Payer: BC Managed Care – PPO | Source: Ambulatory Visit | Attending: Nurse Practitioner | Admitting: Nurse Practitioner

## 2021-09-11 DIAGNOSIS — Z124 Encounter for screening for malignant neoplasm of cervix: Secondary | ICD-10-CM | POA: Insufficient documentation

## 2021-09-13 LAB — CYTOLOGY - PAP
Comment: NEGATIVE
Diagnosis: NEGATIVE
High risk HPV: NEGATIVE

## 2021-09-30 ENCOUNTER — Other Ambulatory Visit (INDEPENDENT_AMBULATORY_CARE_PROVIDER_SITE_OTHER): Payer: BC Managed Care – PPO

## 2021-09-30 DIAGNOSIS — E890 Postprocedural hypothyroidism: Secondary | ICD-10-CM

## 2021-09-30 LAB — TSH: TSH: 0.18 u[IU]/mL — ABNORMAL LOW (ref 0.35–5.50)

## 2021-09-30 LAB — T4, FREE: Free T4: 0.98 ng/dL (ref 0.60–1.60)

## 2021-10-03 ENCOUNTER — Encounter: Payer: Self-pay | Admitting: Endocrinology

## 2021-10-03 ENCOUNTER — Ambulatory Visit: Payer: BC Managed Care – PPO | Admitting: Endocrinology

## 2021-10-03 VITALS — BP 114/82 | HR 72 | Ht 67.0 in | Wt 220.8 lb

## 2021-10-03 DIAGNOSIS — E890 Postprocedural hypothyroidism: Secondary | ICD-10-CM

## 2021-10-03 DIAGNOSIS — E7800 Pure hypercholesterolemia, unspecified: Secondary | ICD-10-CM | POA: Diagnosis not present

## 2021-10-03 DIAGNOSIS — E1165 Type 2 diabetes mellitus with hyperglycemia: Secondary | ICD-10-CM | POA: Diagnosis not present

## 2021-10-03 MED ORDER — OZEMPIC (1 MG/DOSE) 4 MG/3ML ~~LOC~~ SOPN: PEN_INJECTOR | SUBCUTANEOUS | 0 refills | Status: DC

## 2021-10-03 NOTE — Progress Notes (Signed)
Patient ID: Andrea Delacruz, female   DOB: Feb 18, 1961, 61 y.o.   MRN: [id number]           Reason for Appointment: Endocrinology follow-up     History of Present Illness   DIABETES type 2, diagnosis date:  2018  Previous history:  There may have had prediabetes for some time before she was told to have diabetes and started on Metformin No details of her history are available She thinks her blood sugars were relatively well controlled with Metformin 500 mg twice daily.  However her last 2 A1c levels have been 7.2 and 7.3 and she has been followed by her PCP  Recent history:     Non-insulin hypoglycemic drugs: Ozempic 0.5 mg weekly     Side effects from medications:  Diarrhea from high dose metformin  Current diabetes management, blood sugar patterns and problems identified:  Her A1c is last 6.7 compared to 6.3  She had requested changing Rybelsus to Ozempic on her last visit  With taking 0.5 mg weekly she thinks that she has a little better satiety and less tendency to snack  However weight has not come down as yet She has not exercised as much  Again she feels that her blood sugars are fairly good with readings mostly around 120 in the morning Did not bring her monitor           Weight history:  Wt Readings from Last 3 Encounters:  10/03/21 220 lb 12.8 oz (100.2 kg)  07/31/21 218 lb 6.4 oz (99.1 kg)  04/02/21 212 lb (96.2 kg)         Diabetes labs:  Lab Results  Component Value Date   HGBA1C 6.7 (H) 07/29/2021   HGBA1C 6.3 01/24/2021   HGBA1C 6.3 10/16/2020   Lab Results  Component Value Date   MICROALBUR 1.0 07/29/2021   CREATININE 1.00 07/29/2021    Hypothyroidism and other issues: See review of systems   Allergies as of 10/03/2021       Reactions   Atorvastatin Other (See Comments)   Other reaction(s): Other (See Comments) Muscle spasm        Medication List        Accurate as of Oct 03, 2021  3:41 PM. If you have any questions, ask your  nurse or doctor.          acetaminophen 500 MG tablet Commonly known as: TYLENOL Take 500 mg by mouth every 6 (six) hours as needed for mild pain or headache.   azaTHIOprine 50 MG tablet Commonly known as: IMURAN Take by mouth.   calcium carbonate 1250 (500 Ca) MG tablet Commonly known as: OS-CAL - dosed in mg of elemental calcium Take 1 tablet (500 mg of elemental calcium total) by mouth daily with breakfast.   cholecalciferol 25 MCG (1000 UNIT) tablet Commonly known as: VITAMIN D3 Take 1,000 Units by mouth daily.   ezetimibe 10 MG tablet Commonly known as: ZETIA Take 10 mg by mouth daily.   FreeStyle Libre 2 Sensor Misc 2 Devices by Does not apply route every 14 (fourteen) days.   Gemtesa 75 MG Tabs Generic drug: Vibegron Take 75 mg by mouth daily.   hydrochlorothiazide 25 MG tablet Commonly known as: HYDRODIURIL Take 25 mg by mouth daily.   Klor-Con M20 20 MEQ tablet Generic drug: potassium chloride SA TAKE 1 TABLET BY MOUTH EVERY DAY   lisinopril 10 MG tablet Commonly known as: ZESTRIL Take 10 mg by mouth daily.   lisinopril  10 MG tablet Commonly known as: ZESTRIL Take 1 tablet by mouth daily.   methotrexate 250 MG/10ML injection Inject into the skin.   mirabegron ER 50 MG Tb24 tablet Commonly known as: MYRBETRIQ Take 50 mg by mouth daily.   OneTouch Delica Lancets 33G Misc Use as directed to check blood sugar   OneTouch Verio test strip Generic drug: glucose blood USE AS DIRECTED ACCU CHECK GUIDE PREFFERD BY INSURANCE   Ozempic (0.25 or 0.5 MG/DOSE) 2 MG/3ML Sopn Generic drug: Semaglutide(0.25 or 0.5MG /DOS) Inject 0.5 mg into the skin once a week.   RHEUMATE PO Take by mouth.   Synthroid 175 MCG tablet Generic drug: levothyroxine 1 tablet daily before breakfast with extra half tablet on Sundays        Allergies:  Allergies  Allergen Reactions   Atorvastatin Other (See Comments)    Other reaction(s): Other (See Comments) Muscle  spasm     Past Medical History:  Diagnosis Date   Complication of anesthesia    Diabetes mellitus without complication (HCC)    Hypertension    Obesity    PONV (postoperative nausea and vomiting)    Thyroid disease    Hyperactive then was treated with iodine.     Past Surgical History:  Procedure Laterality Date   BREAST REDUCTION SURGERY  [id number]   MUSCLE BIOPSY Right 07/15/2019   Procedure: MUSCLE BIOPSY RIGHT DELTOID AND RIGHT RECTUS FEMORIS MUSCLE;  Surgeon: Gaynelle Adu, MD;  Location: [facility] OR;  Service: General;  Laterality: Right;   REDUCTION MAMMAPLASTY Bilateral    Patient states 20 years ago    Family History  Problem Relation Age of Onset   Stroke Mother    Diabetes Mother    Diabetes Father    Diabetes Sister    Hypertension Sister    Hypertension Maternal Aunt    Cancer Maternal Uncle    Diabetes Paternal Aunt    Diabetes Paternal Uncle    Cancer Maternal Grandmother     Social History:  reports that she has never smoked. She has never used smokeless tobacco. She reports current alcohol use. She reports that she does not use drugs.  ROS  She has post ablative HYPOTHYROIDISM Since about 08/2019 she was taking Tirosint up to , was not controlled with Synthroid  previously  Subsequently she is on Synthroid, brand name  She is taking 175 mcg since 3/23, now taking 7-1/2 tablets a week  On her last visit she had complained of feeling more fatigued and lethargic and going to bed early in the evening However she still feels tired despite increasing her medication  TSH which was 18 is now 0.18  She takes her Synthroid brand name by empty stomach in the morning, 30 minutes before breakfast She is now taking her calcium in the afternoon instead of at breakfast   Lab Results  Component Value Date   TSH 0.18 (L) 09/30/2021   TSH 17.93 (H) 07/29/2021   TSH 0.76 03/29/2021   FREET4 0.98 09/30/2021   FREET4 0.76 07/29/2021   FREET4 1.13 03/29/2021     HISTORY of inflammatory myositis, 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, was on prednisone along with methotrexate injectable with improvement in symptoms She is going to be starting a new medication as her muscle weakness and enzymes have worsened   HYPERTENSION treated with HCTZ 12.5 mg by PCP as also lisinopril She has a BP monitor at home  BP Readings from Last 3 Encounters:  10/03/21 114/82  07/31/21 110/70  04/02/21 Marland Kitchen)  142/100    LIPIDS: Not on any treatment Last LDL was 145 followed by PCP  LABS:  Lab on 09/30/2021  Component Date Value Ref Range Status   Free T4 09/30/2021 0.98  0.60 - 1.60 ng/dL Final   Comment: Specimens from patients who are undergoing biotin therapy and /or ingesting biotin supplements may contain high levels of biotin.  The higher biotin concentration in these specimens interferes with this Free T4 assay.  Specimens that contain high levels  of biotin may cause false high results for this Free T4 assay.  Please interpret results in light of the total clinical presentation of the patient.     TSH 09/30/2021 0.18 (L)  0.35 - 5.50 uIU/mL Final     Examination:   BP 114/82   Pulse 72   Ht 5\' 7"  (1.702 m)   Wt 220 lb 12.8 oz (100.2 kg)   SpO2 99%   BMI 34.58 kg/m   Body mass index is 34.58 kg/m.    ASSESSMENT/ PLAN:    HYPOTHYROIDISM: She is taking brand-name Synthroid equivalent of 187 mcg daily now Although her hypothyroidism is better controlled she is not feeling any better with her energy level Likely her fatigue is unrelated to her thyroid  She will now take 175 mcg, 7 tablets a week She can continue taking calcium at lunch or dinner   Diabetes type 2 with obesity:   A1c is last 6.7  She is now on Ozempic instead of Rybelsus She does however want to continue Ozempic as she has less snack cravings Also likely will have more consistent control long-term with this We will go up to 1 mg instead of 0.5 and subsequently 2 mg weekly Reminded her to  check some readings after meals instead of just in the morning She will try to increase her exercise as tolerated  Follow-up in 3 months Patient Instructions  Thyroid 1 pill daily   Reather LittlerAjay Sharnese Heath 10/03/2021, 3:41 PM

## 2021-10-03 NOTE — Patient Instructions (Signed)
Thyroid 1 pill daily

## 2021-11-15 DIAGNOSIS — M1991 Primary osteoarthritis, unspecified site: Secondary | ICD-10-CM | POA: Insufficient documentation

## 2021-11-18 ENCOUNTER — Encounter: Payer: Self-pay | Admitting: Endocrinology

## 2021-11-18 DIAGNOSIS — E1165 Type 2 diabetes mellitus with hyperglycemia: Secondary | ICD-10-CM

## 2021-11-20 MED ORDER — SEMAGLUTIDE (2 MG/DOSE) 8 MG/3ML ~~LOC~~ SOPN: 2.0000 mg | PEN_INJECTOR | SUBCUTANEOUS | 2 refills | Status: DC

## 2021-12-23 ENCOUNTER — Other Ambulatory Visit: Payer: Self-pay | Admitting: *Deleted

## 2021-12-23 DIAGNOSIS — Z1231 Encounter for screening mammogram for malignant neoplasm of breast: Secondary | ICD-10-CM

## 2022-01-03 ENCOUNTER — Ambulatory Visit
Admission: RE | Admit: 2022-01-03 | Discharge: 2022-01-03 | Disposition: A | Discharge status: Home / Self Care | Payer: BC Managed Care – PPO | Source: Ambulatory Visit | Attending: *Deleted | Admitting: *Deleted

## 2022-01-03 DIAGNOSIS — Z1231 Encounter for screening mammogram for malignant neoplasm of breast: Secondary | ICD-10-CM

## 2022-01-06 ENCOUNTER — Other Ambulatory Visit: Payer: BC Managed Care – PPO

## 2022-01-07 ENCOUNTER — Other Ambulatory Visit (INDEPENDENT_AMBULATORY_CARE_PROVIDER_SITE_OTHER): Payer: BC Managed Care – PPO

## 2022-01-07 DIAGNOSIS — E7800 Pure hypercholesterolemia, unspecified: Secondary | ICD-10-CM | POA: Diagnosis not present

## 2022-01-07 DIAGNOSIS — E1165 Type 2 diabetes mellitus with hyperglycemia: Secondary | ICD-10-CM | POA: Diagnosis not present

## 2022-01-07 DIAGNOSIS — E890 Postprocedural hypothyroidism: Secondary | ICD-10-CM

## 2022-01-07 LAB — BASIC METABOLIC PANEL
BUN: 8 mg/dL (ref 6–23)
CO2: 30 mEq/L (ref 19–32)
Calcium: 10.1 mg/dL (ref 8.4–10.5)
Chloride: 101 mEq/L (ref 96–112)
Creatinine, Ser: 0.83 mg/dL (ref 0.40–1.20)
GFR: 75.64 mL/min (ref >60.00)
Glucose, Bld: 80 mg/dL (ref 70–99)
Potassium: 4.1 mEq/L (ref 3.5–5.1)
Sodium: 138 mEq/L (ref 135–145)

## 2022-01-07 LAB — LDL CHOLESTEROL, DIRECT: Direct LDL: 109 mg/dL

## 2022-01-07 LAB — HEMOGLOBIN A1C: Hgb A1c MFr Bld: 6.2 % (ref 4.6–6.5)

## 2022-01-07 LAB — T4, FREE: Free T4: 0.93 ng/dL (ref 0.60–1.60)

## 2022-01-07 LAB — TSH: TSH: 2.45 u[IU]/mL (ref 0.35–5.50)

## 2022-01-09 ENCOUNTER — Ambulatory Visit: Payer: BC Managed Care – PPO | Admitting: Endocrinology

## 2022-01-09 ENCOUNTER — Encounter: Payer: Self-pay | Admitting: Endocrinology

## 2022-01-09 VITALS — BP 128/70 | HR 84 | Ht 67.0 in | Wt 212.8 lb

## 2022-01-09 DIAGNOSIS — E7800 Pure hypercholesterolemia, unspecified: Secondary | ICD-10-CM | POA: Diagnosis not present

## 2022-01-09 DIAGNOSIS — E119 Type 2 diabetes mellitus without complications: Secondary | ICD-10-CM | POA: Diagnosis not present

## 2022-01-09 DIAGNOSIS — E890 Postprocedural hypothyroidism: Secondary | ICD-10-CM | POA: Diagnosis not present

## 2022-01-09 MED ORDER — NEXLIZET 180-10 MG PO TABS: ORAL_TABLET | ORAL | 3 refills | Status: DC

## 2022-01-09 NOTE — Progress Notes (Signed)
Patient ID: Andrea Delacruz, female   DOB: 02-08-1961, 61 y.o.   MRN: [id number]           Reason for Appointment: Endocrinology follow-up     History of Present Illness   DIABETES type 2, diagnosis date:  2018  Previous history:  There may have had prediabetes for some time before she was told to have diabetes and started on Metformin No details of her history are available She thinks her blood sugars were relatively well controlled with Metformin 500 mg twice daily.  However her last 2 A1c levels have been 7.2 and 7.3 and she has been followed by her PCP  Recent history:     Non-insulin hypoglycemic drugs: Ozempic 2 mg weekly     Side effects from medications:  Diarrhea from high dose metformin  Current diabetes management, blood sugar patterns and problems identified:  Her A1c is down to 6.2, was 6.7 compared to 6.3  She had gone up progressively on her Ozempic dose after her visit 3 months ago Tolerating 2 mg weekly without any nausea  She has finally started losing weight and is down 8 pounds  She is also trying to exercise a little more regularly  She feels that Ozempic is helped her with weight loss and portion control  As before her blood sugars are being checked mostly in the mornings and afternoons and highest reading recently only 111  Lab glucose 80            Weight history:  Wt Readings from Last 3 Encounters:  01/09/22 212 lb 12.8 oz (96.5 kg)  10/03/21 220 lb 12.8 oz (100.2 kg)  07/31/21 218 lb 6.4 oz (99.1 kg)         Diabetes labs:  Lab Results  Component Value Date   HGBA1C 6.2 01/07/2022   HGBA1C 6.7 (H) 07/29/2021   HGBA1C 6.3 01/24/2021   Lab Results  Component Value Date   MICROALBUR 1.0 07/29/2021   CREATININE 0.83 01/07/2022    Hypothyroidism and other issues: See review of systems   Allergies as of 01/09/2022       Reactions   Atorvastatin Other (See Comments)   Other reaction(s): Other (See Comments) Muscle spasm         Medication List        Accurate as of January 09, 2022 10:01 AM. If you have any questions, ask your nurse or doctor.          acetaminophen 500 MG tablet Commonly known as: TYLENOL Take 500 mg by mouth every 6 (six) hours as needed for mild pain or headache.   azaTHIOprine 50 MG tablet Commonly known as: IMURAN Take by mouth.   calcium carbonate 1250 (500 Ca) MG tablet Commonly known as: OS-CAL - dosed in mg of elemental calcium Take 1 tablet (500 mg of elemental calcium total) by mouth daily with breakfast.   cholecalciferol 25 MCG (1000 UNIT) tablet Commonly known as: VITAMIN D3 Take 1,000 Units by mouth daily.   ezetimibe 10 MG tablet Commonly known as: ZETIA Take 10 mg by mouth daily.   FreeStyle Libre 2 Sensor Misc 2 Devices by Does not apply route every 14 (fourteen) days.   Gemtesa 75 MG Tabs Generic drug: Vibegron Take 75 mg by mouth daily.   hydrochlorothiazide 25 MG tablet Commonly known as: HYDRODIURIL Take 25 mg by mouth daily.   Klor-Con M20 20 MEQ tablet Generic drug: potassium chloride SA TAKE 1 TABLET BY MOUTH EVERY DAY  lisinopril 10 MG tablet Commonly known as: ZESTRIL Take 10 mg by mouth daily.   lisinopril 10 MG tablet Commonly known as: ZESTRIL Take 1 tablet by mouth daily.   methotrexate 250 MG/10ML injection Inject into the skin.   mirabegron ER 50 MG Tb24 tablet Commonly known as: MYRBETRIQ Take 50 mg by mouth daily.   OneTouch Delica Lancets 33G Misc Use as directed to check blood sugar   OneTouch Verio test strip Generic drug: glucose blood USE AS DIRECTED ACCU CHECK GUIDE PREFFERD BY INSURANCE   Ozempic (1 MG/DOSE) 4 MG/3ML Sopn Generic drug: Semaglutide (1 MG/DOSE) Inject 1 mg weekly   Semaglutide (2 MG/DOSE) 8 MG/3ML Sopn Inject 2 mg as directed once a week.   RHEUMATE PO Take by mouth.   Synthroid 175 MCG tablet Generic drug: levothyroxine 1 tablet daily before breakfast with extra half tablet on Sundays         Allergies:  Allergies  Allergen Reactions   Atorvastatin Other (See Comments)    Other reaction(s): Other (See Comments) Muscle spasm     Past Medical History:  Diagnosis Date   Complication of anesthesia    Diabetes mellitus without complication (HCC)    Hypertension    Obesity    PONV (postoperative nausea and vomiting)    Thyroid disease    Hyperactive then was treated with iodine.     Past Surgical History:  Procedure Laterality Date   BREAST REDUCTION SURGERY  [id number]   MUSCLE BIOPSY Right 07/15/2019   Procedure: MUSCLE BIOPSY RIGHT DELTOID AND RIGHT RECTUS FEMORIS MUSCLE;  Surgeon: Gaynelle Adu, MD;  Location: [facility] OR;  Service: General;  Laterality: Right;   REDUCTION MAMMAPLASTY Bilateral    Patient states 20 years ago    Family History  Problem Relation Age of Onset   Stroke Mother    Diabetes Mother    Diabetes Father    Diabetes Sister    Hypertension Sister    Hypertension Maternal Aunt    Cancer Maternal Uncle    Diabetes Paternal Aunt    Diabetes Paternal Uncle    Cancer Maternal Grandmother     Social History:  reports that she has never smoked. She has never used smokeless tobacco. She reports current alcohol use. She reports that she does not use drugs.  ROS  She has post ablative HYPOTHYROIDISM Since about 08/2019 she was taking Tirosint up to , was not controlled with Synthroid  previously  Subsequently she is on Synthroid, brand name  She is taking 175 mcg since 3/23, now taking 1 tablet daily She has no unusual fatigue Has lost weight as above TSH is finally normal at 2.5  She takes her Synthroid brand name by empty stomach in the morning, 30 minutes before breakfast She is now taking her calcium in the afternoon instead of at breakfast   Lab Results  Component Value Date   TSH 2.45 01/07/2022   TSH 0.18 (L) 09/30/2021   TSH 17.93 (H) 07/29/2021   FREET4 0.93 01/07/2022   FREET4 0.98 09/30/2021   FREET4 0.76  07/29/2021    HISTORY of inflammatory myositis, 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, was on prednisone along with methotrexate injectable with improvement in symptoms   HYPERTENSION treated with HCTZ 12.5 mg by PCP as also lisinopril She has a BP monitor at home  BP Readings from Last 3 Encounters:  01/09/22 128/70  10/03/21 114/82  07/31/21 110/70    LIPIDS: LDL has been consistently over 100 previously and generally around 140  She has no side effects with starting Zetia 10 mg daily since 5/23  No results found for: "CHOL" No results found for: "HDL" No results found for: "LDLCALC" No results found for: "TRIG" No results found for: "CHOLHDL" Lab Results  Component Value Date   LDLDIRECT 109.0 01/07/2022    LABS:  Lab on 01/07/2022  Component Date Value Ref Range Status   Free T4 01/07/2022 0.93  0.60 - 1.60 ng/dL Final   Comment: Specimens from patients who are undergoing biotin therapy and /or ingesting biotin supplements may contain high levels of biotin.  The higher biotin concentration in these specimens interferes with this Free T4 assay.  Specimens that contain high levels  of biotin may cause false high results for this Free T4 assay.  Please interpret results in light of the total clinical presentation of the patient.     TSH 01/07/2022 2.45  0.35 - 5.50 uIU/mL Final   Direct LDL 01/07/2022 109.0  mg/dL Final   Optimal:  <235 mg/dLNear or Above Optimal:  100-129 mg/dLBorderline High:  130-159 mg/dLHigh:  160-189 mg/dLVery High:  >190 mg/dL   Sodium 36/14/4315 400  135 - 145 mEq/L Final   Potassium 01/07/2022 4.1  3.5 - 5.1 mEq/L Final   Chloride 01/07/2022 101  96 - 112 mEq/L Final   CO2 01/07/2022 30  19 - 32 mEq/L Final   Glucose, Bld 01/07/2022 80  70 - 99 mg/dL Final   BUN 86/[birth year]/1950 8  6 - 23 mg/dL Final   Creatinine, Ser 01/07/2022 0.83  0.40 - 1.20 mg/dL Final   GFR 93/26/7124 75.64  >60.00 mL/min Final   Calculated using the CKD-EPI Creatinine Equation (2021)   Calcium  01/07/2022 10.1  8.4 - 10.5 mg/dL Final   Hgb P8K MFr Bld 01/07/2022 6.2  4.6 - 6.5 % Final   Glycemic Control Guidelines for People with Diabetes:Non Diabetic:  <6%Goal of Therapy: <7%Additional Action Suggested:  >8%      Examination:   BP 128/70   Pulse 84   Ht 5\' 7"  (1.702 m)   Wt 212 lb 12.8 oz (96.5 kg)   SpO2 98%   BMI 33.33 kg/m   Body mass index is 33.33 kg/m.    ASSESSMENT/ PLAN:    HYPOTHYROIDISM: She is taking brand-name Synthroid 175 mcg daily now  Although previously has had variable TSH levels she is now having a normal level  Subjectively doing well  She will continue to take 175 mcg, 7 tablets a week-morning on empty stomach She can continue taking calcium at lunch or dinner   Diabetes type 2 with obesity:   A1c is 6.3 compared to 6.7  She is now on Ozempic 2 mg weekly She has lost weight and her blood sugars are excellent She can try to continue increasing her exercise and healthy lifestyle with diet Discussed checking blood sugars after meals especially dinner and target blood sugars of 160 or less after meals Follow-up in 4 months  HYPERLIPIDEMIA: She has not been on statins possibly because of history of inflammatory myositis Continues to have mild increase in LDL even with Zetia She agrees to try Nexlizet and discussed how this works as well as given her co-pay card for this  There are no Patient Instructions on file for this visit.   01/09/2022, 10:01 AM

## 2022-01-15 ENCOUNTER — Other Ambulatory Visit (HOSPITAL_COMMUNITY): Payer: Self-pay

## 2022-01-15 ENCOUNTER — Telehealth: Payer: Self-pay

## 2022-01-15 DIAGNOSIS — E7800 Pure hypercholesterolemia, unspecified: Secondary | ICD-10-CM

## 2022-01-15 NOTE — Telephone Encounter (Signed)
Patient Advocate Encounter   Received notification that prior authorization is required for Nexlizet 180-10MG  tablets  Submitted: 01-15-2022 Key [id number]

## 2022-01-15 NOTE — Telephone Encounter (Signed)
Patient Advocate Encounter  Received a fax regarding Prior Authorization for Nexlizet 180-10MG  tablets.   Authorization has been DENIED due to You do not meet the requirements of your plan. Your plan covers this drug when you are an adult with heterozygous familial hypercholesterolemia or established heart disease.

## 2022-01-17 MED ORDER — COLESEVELAM HCL 625 MG PO TABS: ORAL_TABLET | ORAL | 1 refills | Status: DC

## 2022-01-17 NOTE — Addendum Note (Signed)
Addended by: Eliseo Squires on: 01/17/2022 09:31 AM   Modules accepted: Orders

## 2022-02-10 ENCOUNTER — Other Ambulatory Visit: Payer: Self-pay | Admitting: Endocrinology

## 2022-02-10 DIAGNOSIS — E1165 Type 2 diabetes mellitus with hyperglycemia: Secondary | ICD-10-CM

## 2022-03-15 ENCOUNTER — Other Ambulatory Visit: Payer: Self-pay | Admitting: Endocrinology

## 2022-03-15 DIAGNOSIS — E7800 Pure hypercholesterolemia, unspecified: Secondary | ICD-10-CM

## 2022-04-30 ENCOUNTER — Other Ambulatory Visit: Payer: Self-pay

## 2022-04-30 DIAGNOSIS — E7800 Pure hypercholesterolemia, unspecified: Secondary | ICD-10-CM

## 2022-04-30 MED ORDER — POTASSIUM CHLORIDE CRYS ER 20 MEQ PO TBCR: 20.0000 meq | EXTENDED_RELEASE_TABLET | Freq: Every day | ORAL | 1 refills | Status: DC

## 2022-05-14 ENCOUNTER — Other Ambulatory Visit (INDEPENDENT_AMBULATORY_CARE_PROVIDER_SITE_OTHER): Payer: BC Managed Care – PPO

## 2022-05-14 DIAGNOSIS — E119 Type 2 diabetes mellitus without complications: Secondary | ICD-10-CM

## 2022-05-14 DIAGNOSIS — E890 Postprocedural hypothyroidism: Secondary | ICD-10-CM | POA: Diagnosis not present

## 2022-05-14 DIAGNOSIS — E7800 Pure hypercholesterolemia, unspecified: Secondary | ICD-10-CM | POA: Diagnosis not present

## 2022-05-14 LAB — COMPREHENSIVE METABOLIC PANEL
ALT: 35 U/L (ref 0–35)
AST: 38 U/L — ABNORMAL HIGH (ref 0–37)
Albumin: 4.4 g/dL (ref 3.5–5.2)
Alkaline Phosphatase: 81 U/L (ref 39–117)
BUN: 9 mg/dL (ref 6–23)
CO2: 30 mEq/L (ref 19–32)
Calcium: 10.3 mg/dL (ref 8.4–10.5)
Chloride: 102 mEq/L (ref 96–112)
Creatinine, Ser: 0.81 mg/dL (ref 0.40–1.20)
GFR: 77.7 mL/min (ref >60.00)
Glucose, Bld: 94 mg/dL (ref 70–99)
Potassium: 3.9 mEq/L (ref 3.5–5.1)
Sodium: 139 mEq/L (ref 135–145)
Total Bilirubin: 0.5 mg/dL (ref 0.2–1.2)
Total Protein: 7.5 g/dL (ref 6.0–8.3)

## 2022-05-14 LAB — LIPID PANEL
Cholesterol: 211 mg/dL — ABNORMAL HIGH (ref 0–200)
HDL: 75.3 mg/dL (ref >39.00)
LDL Cholesterol: 122 mg/dL — ABNORMAL HIGH (ref 0–99)
NonHDL: 135.3
Total CHOL/HDL Ratio: 3
Triglycerides: 67 mg/dL (ref 0.0–149.0)
VLDL: 13.4 mg/dL (ref 0.0–40.0)

## 2022-05-14 LAB — MICROALBUMIN / CREATININE URINE RATIO
Creatinine,U: 219.9 mg/dL
Microalb Creat Ratio: 0.4 mg/g (ref 0.0–30.0)
Microalb, Ur: 0.8 mg/dL (ref 0.0–1.9)

## 2022-05-14 LAB — TSH: TSH: 1.71 u[IU]/mL (ref 0.35–5.50)

## 2022-05-14 LAB — HEMOGLOBIN A1C: Hgb A1c MFr Bld: 6 % (ref 4.6–6.5)

## 2022-05-16 ENCOUNTER — Encounter: Payer: Self-pay | Admitting: Endocrinology

## 2022-05-16 ENCOUNTER — Ambulatory Visit: Payer: BC Managed Care – PPO | Admitting: Endocrinology

## 2022-05-16 VITALS — BP 110/70 | HR 74 | Ht 67.0 in | Wt 211.4 lb

## 2022-05-16 DIAGNOSIS — E7800 Pure hypercholesterolemia, unspecified: Secondary | ICD-10-CM

## 2022-05-16 DIAGNOSIS — E890 Postprocedural hypothyroidism: Secondary | ICD-10-CM

## 2022-05-16 DIAGNOSIS — E119 Type 2 diabetes mellitus without complications: Secondary | ICD-10-CM | POA: Diagnosis not present

## 2022-05-16 MED ORDER — EZETIMIBE 10 MG PO TABS: 10.0000 mg | ORAL_TABLET | Freq: Every day | ORAL | 3 refills | Status: DC

## 2022-05-16 MED ORDER — COLESEVELAM HCL 3.75 G PO PACK: PACK | ORAL | 2 refills | Status: DC

## 2022-05-16 NOTE — Progress Notes (Signed)
Patient ID: Andrea Delacruz, female   DOB: December 28, 1960, 61 y.o.   MRN: [id number]           Reason for Appointment: Endocrinology follow-up     History of Present Illness   DIABETES type 2, diagnosis date:  2018  Previous history:  There may have had prediabetes for some time before she was told to have diabetes and started on Metformin No details of her history are available She thinks her blood sugars were relatively well controlled with Metformin 500 mg twice daily.  However her last 2 A1c levels have been 7.2 and 7.3 and she has been followed by her PCP  Recent history:     Non-insulin hypoglycemic drugs: Ozempic 2 mg weekly     Side effects from medications:  Diarrhea from high dose metformin  Current diabetes management, blood sugar patterns and problems identified:  Her A1c is down to 6   She is doing well with 2 mg Ozempic with excellent blood sugar control  however has not lost any further weight has yet  She may not have had as consistent her diet over the last month or so She is walking 4 miles 3 days a week and going to some exercise classes at the gym also As before her blood sugars are being checked mostly in the mornings with fairly good readings Recent blood sugar range 66-131 Lab glucose 94            Weight history:  Wt Readings from Last 3 Encounters:  05/16/22 211 lb 6.4 oz (95.9 kg)  01/09/22 212 lb 12.8 oz (96.5 kg)  10/03/21 220 lb 12.8 oz (100.2 kg)         Diabetes labs:  Lab Results  Component Value Date   HGBA1C 6.0 05/14/2022   HGBA1C 6.2 01/07/2022   HGBA1C 6.7 (H) 07/29/2021   Lab Results  Component Value Date   MICROALBUR 0.8 05/14/2022   LDLCALC 122 (H) 05/14/2022   CREATININE 0.81 05/14/2022    Hypothyroidism and other issues: See review of systems   Allergies as of 05/16/2022       Reactions   Atorvastatin Other (See Comments)   Other reaction(s): Other (See Comments) Muscle spasm        Medication List         Accurate as of May 16, 2022  3:51 PM. If you have any questions, ask your nurse or doctor.          STOP taking these medications    colesevelam 625 MG tablet Commonly known as: WELCHOL Replaced by: Colesevelam HCl 3.75 g Pack Stopped by: Reather Littler, MD   Nexlizet 180-10 MG Tabs Generic drug: Bempedoic Acid-Ezetimibe Stopped by: Reather Littler, MD       TAKE these medications    acetaminophen 500 MG tablet Commonly known as: TYLENOL Take 500 mg by mouth every 6 (six) hours as needed for mild pain or headache.   azaTHIOprine 50 MG tablet Commonly known as: IMURAN Take by mouth.   calcium carbonate 1250 (500 Ca) MG tablet Commonly known as: OS-CAL - dosed in mg of elemental calcium Take 1 tablet (500 mg of elemental calcium total) by mouth daily with breakfast.   cholecalciferol 25 MCG (1000 UNIT) tablet Commonly known as: VITAMIN D3 Take 1,000 Units by mouth daily.   Colesevelam HCl 3.75 g Pack Commonly known as: Welchol 1 pack daily Replaces: colesevelam 625 MG tablet Started by: Reather Littler, MD   ezetimibe 10 MG  tablet Commonly known as: Zetia Take 1 tablet (10 mg total) by mouth daily. Started by: Reather Littler, MD   FreeStyle Libre 2 Sensor Misc 2 Devices by Does not apply route every 14 (fourteen) days.   Gemtesa 75 MG Tabs Generic drug: Vibegron Take 75 mg by mouth daily.   hydrochlorothiazide 25 MG tablet Commonly known as: HYDRODIURIL Take 25 mg by mouth daily.   lisinopril 10 MG tablet Commonly known as: ZESTRIL Take 10 mg by mouth daily.   lisinopril 10 MG tablet Commonly known as: ZESTRIL Take 1 tablet by mouth daily.   methotrexate 250 MG/10ML injection Inject into the skin.   mirabegron ER 50 MG Tb24 tablet Commonly known as: MYRBETRIQ Take 50 mg by mouth daily.   OneTouch Delica Lancets 33G Misc Use as directed to check blood sugar   OneTouch Verio test strip Generic drug: glucose blood USE AS DIRECTED ACCU CHECK GUIDE  PREFFERD BY INSURANCE   Ozempic (2 MG/DOSE) 8 MG/3ML Sopn Generic drug: Semaglutide (2 MG/DOSE) DIAL AND INJECT UNDER THE SKIN 2 MG WEEKLY   potassium chloride SA 20 MEQ tablet Commonly known as: Klor-Con M20 Take 1 tablet (20 mEq total) by mouth daily.   RHEUMATE PO Take by mouth.   Synthroid 175 MCG tablet Generic drug: levothyroxine 1 tablet daily before breakfast with extra half tablet on Sundays        Allergies:  Allergies  Allergen Reactions   Atorvastatin Other (See Comments)    Other reaction(s): Other (See Comments) Muscle spasm     Past Medical History:  Diagnosis Date   Complication of anesthesia    Diabetes mellitus without complication (HCC)    Hypertension    Obesity    PONV (postoperative nausea and vomiting)    Thyroid disease    Hyperactive then was treated with iodine.     Past Surgical History:  Procedure Laterality Date   BREAST REDUCTION SURGERY  [id number]   MUSCLE BIOPSY Right 07/15/2019   Procedure: MUSCLE BIOPSY RIGHT DELTOID AND RIGHT RECTUS FEMORIS MUSCLE;  Surgeon: Gaynelle Adu, MD;  Location: [facility] OR;  Service: General;  Laterality: Right;   REDUCTION MAMMAPLASTY Bilateral    Patient states 20 years ago    Family History  Problem Relation Age of Onset   Stroke Mother    Diabetes Mother    Diabetes Father    Diabetes Sister    Hypertension Sister    Hypertension Maternal Aunt    Cancer Maternal Uncle    Diabetes Paternal Aunt    Diabetes Paternal Uncle    Cancer Maternal Grandmother     Social History:  reports that she has never smoked. She has never used smokeless tobacco. She reports current alcohol use. She reports that she does not use drugs.  ROS  She has post ablative HYPOTHYROIDISM Since about 08/2019 she was taking Tirosint up to , was not controlled with Synthroid  previously  Subsequently she is on Synthroid, brand name  She is taking 175 mcg since 3/23, now taking 1 tablet daily Does not feel unusually  tired TSH is now consistently normal  She takes her Synthroid brand name by empty stomach in the morning, 30 minutes before breakfast She is taking her calcium in the afternoon instead of at breakfast   Lab Results  Component Value Date   TSH 1.71 05/14/2022   TSH 2.45 01/07/2022   TSH 0.18 (L) 09/30/2021   FREET4 0.93 01/07/2022   FREET4 0.98 09/30/2021   FREET4 0.76  07/29/2021    HISTORY of inflammatory myositis, 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, was on prednisone along with methotrexate injectable with improvement in symptoms   HYPERTENSION treated with HCTZ 12.5 mg by PCP as also lisinopril She has a BP monitor at home  BP Readings from Last 3 Encounters:  05/16/22 110/70  01/09/22 128/70  10/03/21 114/82    LIPIDS: LDL has been consistently over 100 previously and generally around 140 Previously on Zetia 10 mg daily since 5/23 but unclear whether she is taking this or not Nexlizet was denied by insurance She is taking WelChol but not taking this regularly, currently only prescribed 3 tablets daily LDL is gone up to 122 from 109  Lab Results  Component Value Date   CHOL 211 (H) 05/14/2022   Lab Results  Component Value Date   HDL 75.30 05/14/2022   Lab Results  Component Value Date   LDLCALC 122 (H) 05/14/2022   Lab Results  Component Value Date   TRIG 67.0 05/14/2022   Lab Results  Component Value Date   CHOLHDL 3 05/14/2022   Lab Results  Component Value Date   LDLDIRECT 109.0 01/07/2022      LABS:  Lab on 05/14/2022  Component Date Value Ref Range Status   TSH 05/14/2022 1.71  0.35 - 5.50 uIU/mL Final   Microalb, Ur 05/14/2022 0.8  0.0 - 1.9 mg/dL Final   Creatinine,U 40/98/119112/27/2023 219.9  mg/dL Final   Microalb Creat Ratio 05/14/2022 0.4  0.0 - 30.0 mg/g Final   Cholesterol 05/14/2022 211 (H)  0 - 200 mg/dL Final   ATP III Classification       Desirable:  < 200 mg/dL               Borderline High:  200 - 239 mg/dL          High:  > = 478240 mg/dL    Triglycerides 29/56/213012/27/2023 67.0  0.0 - 149.0 mg/dL Final   Normal:  <865<150 mg/dLBorderline High:  150 - 199 mg/dL   HDL 78/46/962912/27/2023 52.8475.30  >39.00 mg/dL Final   VLDL 13/24/401012/27/2023 13.4  0.0 - 40.0 mg/dL Final   LDL Cholesterol 05/14/2022 122 (H)  0 - 99 mg/dL Final   Total CHOL/HDL Ratio 05/14/2022 3   Final                  Men          Women1/2 Average Risk     3.4          3.3Average Risk          5.0          4.42X Average Risk          9.6          7.13X Average Risk          15.0          11.0                       NonHDL 05/14/2022 135.30   Final   NOTE:  Non-HDL goal should be 30 mg/dL higher than patient's LDL goal (i.e. LDL goal of < 70 mg/dL, would have non-HDL goal of < 100 mg/dL)   Sodium 27/25/366412/27/2023 403139  135 - 145 mEq/L Final   Potassium 05/14/2022 3.9  3.5 - 5.1 mEq/L Final   Chloride 05/14/2022 102  96 - 112 mEq/L Final   CO2 05/14/2022 30  19 - 32 mEq/L  Final   Glucose, Bld 05/14/2022 94  70 - 99 mg/dL Final   BUN 40/98/1191 9  6 - 23 mg/dL Final   Creatinine, Ser 05/14/2022 0.81  0.40 - 1.20 mg/dL Final   Total Bilirubin 05/14/2022 0.5  0.2 - 1.2 mg/dL Final   Alkaline Phosphatase 05/14/2022 81  39 - 117 U/L Final   AST 05/14/2022 38 (H)  0 - 37 U/L Final   ALT 05/14/2022 35  0 - 35 U/L Final   Total Protein 05/14/2022 7.5  6.0 - 8.3 g/dL Final   Albumin 47/82/9562 4.4  3.5 - 5.2 g/dL Final   GFR 13/12/6576 77.70  >60.00 mL/min Final   Calculated using the CKD-EPI Creatinine Equation (2021)   Calcium 05/14/2022 10.3  8.4 - 10.5 mg/dL Final   Hgb I6N MFr Bld 05/14/2022 6.0  4.6 - 6.5 % Final   Glycemic Control Guidelines for People with Diabetes:Non Diabetic:  <6%Goal of Therapy: <7%Additional Action Suggested:  >8%      Examination:   BP 110/70   Pulse 74   Ht 5\' 7"  (1.702 m)   Wt 211 lb 6.4 oz (95.9 kg)   SpO2 96%   BMI 33.11 kg/m   Body mass index is 33.11 kg/m.    ASSESSMENT/ PLAN:    HYPOTHYROIDISM: She is taking brand-name Synthroid 175 mcg daily now   Although previously has had variable TSH levels she is now having a normal level  Subjectively doing well  She will continue to take 175 mcg, 7 tablets a week-morning on empty stomach She can continue taking calcium at lunch or dinner   Diabetes type 2 with obesity:   A1c is 6  She is continuing on Ozempic 2 mg weekly Her blood sugar control is excellent although she is only checking fasting readings She feels like she can lose more weight but likely can do better with meal planning She does regular exercise however Encouraged her to check more readings after meals also to help identify foods that raise her blood sugar  Follow-up in 4 months  HYPERLIPIDEMIA: She does need better control As above she is not clear if she is taking Zetia Will restart Zetia but also switch WelChol to the packets since she prefers this instead of 6 tablets daily  There are no Patient Instructions on file for this visit.   05/16/2022, 3:51 PM

## 2022-05-25 ENCOUNTER — Encounter: Payer: Self-pay | Admitting: Endocrinology

## 2022-05-29 ENCOUNTER — Other Ambulatory Visit: Payer: Self-pay | Admitting: Endocrinology

## 2022-05-29 DIAGNOSIS — E1165 Type 2 diabetes mellitus with hyperglycemia: Secondary | ICD-10-CM

## 2022-07-14 ENCOUNTER — Encounter: Payer: Self-pay | Admitting: Endocrinology

## 2022-07-14 DIAGNOSIS — E119 Type 2 diabetes mellitus without complications: Secondary | ICD-10-CM

## 2022-07-15 ENCOUNTER — Other Ambulatory Visit: Payer: Self-pay

## 2022-07-15 ENCOUNTER — Encounter: Payer: Self-pay | Admitting: Endocrinology

## 2022-07-15 DIAGNOSIS — E119 Type 2 diabetes mellitus without complications: Secondary | ICD-10-CM

## 2022-07-15 MED ORDER — ONETOUCH VERIO VI STRP: ORAL_STRIP | 3 refills | Status: DC

## 2022-07-16 ENCOUNTER — Other Ambulatory Visit: Payer: Self-pay

## 2022-07-16 MED ORDER — ONETOUCH DELICA LANCETS 33G MISC: 5 refills | Status: DC

## 2022-08-01 ENCOUNTER — Encounter: Payer: Self-pay | Admitting: Endocrinology

## 2022-08-01 DIAGNOSIS — E119 Type 2 diabetes mellitus without complications: Secondary | ICD-10-CM

## 2022-08-04 MED ORDER — SYNTHROID 175 MCG PO TABS: ORAL_TABLET | ORAL | 1 refills | Status: DC

## 2022-08-19 ENCOUNTER — Other Ambulatory Visit (HOSPITAL_COMMUNITY): Payer: Self-pay

## 2022-08-27 HISTORY — PX: CATARACT EXTRACTION: SUR2

## 2022-09-03 HISTORY — PX: CATARACT EXTRACTION: SUR2

## 2022-09-09 ENCOUNTER — Other Ambulatory Visit: Payer: Self-pay

## 2022-09-09 ENCOUNTER — Telehealth: Payer: Self-pay

## 2022-09-09 DIAGNOSIS — E1165 Type 2 diabetes mellitus with hyperglycemia: Secondary | ICD-10-CM

## 2022-09-09 MED ORDER — OZEMPIC (2 MG/DOSE) 8 MG/3ML ~~LOC~~ SOPN: PEN_INJECTOR | SUBCUTANEOUS | 2 refills | Status: DC

## 2022-09-09 NOTE — Telephone Encounter (Signed)
PA request received via CMM for Ozempic (2 MG/DOSE) /3ML pen-injectors  PA has been submitted to [employer] and is pending determination  Key: [id number]

## 2022-09-10 ENCOUNTER — Other Ambulatory Visit: Payer: Self-pay

## 2022-09-10 DIAGNOSIS — E1165 Type 2 diabetes mellitus with hyperglycemia: Secondary | ICD-10-CM

## 2022-09-10 MED ORDER — OZEMPIC (2 MG/DOSE) 8 MG/3ML ~~LOC~~ SOPN: PEN_INJECTOR | SUBCUTANEOUS | 2 refills | Status: DC

## 2022-09-10 NOTE — Telephone Encounter (Signed)
PA has been APPROVED from 09/09/2022-09/08/2025

## 2022-09-12 ENCOUNTER — Other Ambulatory Visit (INDEPENDENT_AMBULATORY_CARE_PROVIDER_SITE_OTHER): Payer: BC Managed Care – PPO

## 2022-09-12 DIAGNOSIS — E890 Postprocedural hypothyroidism: Secondary | ICD-10-CM | POA: Diagnosis not present

## 2022-09-12 DIAGNOSIS — E119 Type 2 diabetes mellitus without complications: Secondary | ICD-10-CM

## 2022-09-12 DIAGNOSIS — E7800 Pure hypercholesterolemia, unspecified: Secondary | ICD-10-CM | POA: Diagnosis not present

## 2022-09-12 LAB — LIPID PANEL
Cholesterol: 195 mg/dL (ref 0–200)
HDL: 80.5 mg/dL (ref >39.00)
LDL Cholesterol: 106 mg/dL — ABNORMAL HIGH (ref 0–99)
NonHDL: 114.41
Total CHOL/HDL Ratio: 2
Triglycerides: 43 mg/dL (ref 0.0–149.0)
VLDL: 8.6 mg/dL (ref 0.0–40.0)

## 2022-09-12 LAB — COMPREHENSIVE METABOLIC PANEL
ALT: 21 U/L (ref 0–35)
AST: 32 U/L (ref 0–37)
Albumin: 4.5 g/dL (ref 3.5–5.2)
Alkaline Phosphatase: 99 U/L (ref 39–117)
BUN: 15 mg/dL (ref 6–23)
CO2: 29 mEq/L (ref 19–32)
Calcium: 9.9 mg/dL (ref 8.4–10.5)
Chloride: 99 mEq/L (ref 96–112)
Creatinine, Ser: 0.85 mg/dL (ref 0.40–1.20)
GFR: 73.16 mL/min (ref >60.00)
Glucose, Bld: 88 mg/dL (ref 70–99)
Potassium: 3.6 mEq/L (ref 3.5–5.1)
Sodium: 135 mEq/L (ref 135–145)
Total Bilirubin: 0.6 mg/dL (ref 0.2–1.2)
Total Protein: 7.6 g/dL (ref 6.0–8.3)

## 2022-09-12 LAB — TSH: TSH: 8.58 u[IU]/mL — ABNORMAL HIGH (ref 0.35–5.50)

## 2022-09-12 LAB — HEMOGLOBIN A1C: Hgb A1c MFr Bld: 6 % (ref 4.6–6.5)

## 2022-09-16 ENCOUNTER — Encounter: Payer: Self-pay | Admitting: Endocrinology

## 2022-09-16 ENCOUNTER — Ambulatory Visit: Payer: BC Managed Care – PPO | Admitting: Endocrinology

## 2022-09-16 VITALS — BP 114/68 | HR 67 | Ht 67.0 in | Wt 215.0 lb

## 2022-09-16 DIAGNOSIS — Z7985 Long-term (current) use of injectable non-insulin antidiabetic drugs: Secondary | ICD-10-CM | POA: Diagnosis not present

## 2022-09-16 DIAGNOSIS — E7800 Pure hypercholesterolemia, unspecified: Secondary | ICD-10-CM

## 2022-09-16 DIAGNOSIS — E890 Postprocedural hypothyroidism: Secondary | ICD-10-CM

## 2022-09-16 DIAGNOSIS — E119 Type 2 diabetes mellitus without complications: Secondary | ICD-10-CM

## 2022-09-16 MED ORDER — LEVOTHYROXINE SODIUM 200 MCG PO TABS: 200.0000 ug | ORAL_TABLET | Freq: Every day | ORAL | 3 refills | Status: DC

## 2022-09-16 MED ORDER — SYNTHROID 200 MCG PO TABS: 200.0000 ug | ORAL_TABLET | Freq: Every day | ORAL | 2 refills | Status: DC

## 2022-09-16 MED ORDER — LEVOTHYROXINE SODIUM 200 MCG PO TABS
200.0000 ug | ORAL_TABLET | Freq: Every day | ORAL | 3 refills | Status: DC
Start: 2022-09-16 — End: 2023-01-06

## 2022-09-16 NOTE — Patient Instructions (Signed)
Take Welchol daily

## 2022-09-16 NOTE — Progress Notes (Signed)
Patient ID: Andrea Delacruz, female   DOB: May 14, 1961, 62 y.o.   MRN: [id number]           Reason for Appointment: Endocrinology follow-up     History of Present Illness   DIABETES type 2, diagnosis date:  2018  Previous history:  There may have had prediabetes for some time before she was told to have diabetes and started on Metformin No details of her history are available She thinks her blood sugars were relatively well controlled with Metformin 500 mg twice daily.  However her last 2 A1c levels have been 7.2 and 7.3 and she has been followed by her PCP  Recent history:     Non-insulin hypoglycemic drugs: Ozempic 2 mg weekly     Side effects from medications:  Diarrhea from high dose metformin  Current diabetes management, blood sugar patterns and problems identified:  Her A1c is unchanged at 6  She is doing well with 2 mg Ozempic with consistently good blood sugar control She does appear to have good blood sugars at home but checking quite infrequently, highest reading 159 after breakfast Recently because of fatigue she has not done her walking and has gained 4 pounds Usually able to manage her diet well with the help of Ozempic Lab glucose 88 fasting and usually morning readings in the mornings are in the normal range  Home glucose range: Recently highest 159 with 30-day average 119, has 8 readings          Weight history:  Wt Readings from Last 3 Encounters:  09/16/22 215 lb (97.5 kg)  05/16/22 211 lb 6.4 oz (95.9 kg)  01/09/22 212 lb 12.8 oz (96.5 kg)         Diabetes labs:  Lab Results  Component Value Date   HGBA1C 6.0 09/12/2022   HGBA1C 6.0 05/14/2022   HGBA1C 6.2 01/07/2022   Lab Results  Component Value Date   MICROALBUR 0.8 05/14/2022   LDLCALC 106 (H) 09/12/2022   CREATININE 0.85 09/12/2022    Hypothyroidism and other issues: See review of systems   Allergies as of 09/16/2022       Reactions   Atorvastatin Other (See Comments)   Other  reaction(s): Other (See Comments) Muscle spasm        Medication List        Accurate as of September 16, 2022  9:08 AM. If you have any questions, ask your nurse or doctor.          acetaminophen 500 MG tablet Commonly known as: TYLENOL Take 500 mg by mouth every 6 (six) hours as needed for mild pain or headache.   azaTHIOprine 50 MG tablet Commonly known as: IMURAN Take by mouth.   calcium carbonate 1250 (500 Ca) MG tablet Commonly known as: OS-CAL - dosed in mg of elemental calcium Take 1 tablet (500 mg of elemental calcium total) by mouth daily with breakfast.   cholecalciferol 25 MCG (1000 UNIT) tablet Commonly known as: VITAMIN D3 Take 1,000 Units by mouth daily.   Colesevelam HCl 3.75 g Pack Commonly known as: Welchol 1 pack daily   ezetimibe 10 MG tablet Commonly known as: Zetia Take 1 tablet (10 mg total) by mouth daily.   folic acid 1 MG tablet Commonly known as: FOLVITE Take 1 mg by mouth 3 (three) times daily.   FreeStyle Libre 2 Sensor Misc 2 Devices by Does not apply route every 14 (fourteen) days.   Gemtesa 75 MG Tabs Generic drug: Vibegron Take  75 mg by mouth daily.   hydrochlorothiazide 25 MG tablet Commonly known as: HYDRODIURIL Take 25 mg by mouth daily.   lisinopril 10 MG tablet Commonly known as: ZESTRIL Take 10 mg by mouth daily.   methotrexate 250 MG/10ML injection Inject into the skin.   mirabegron ER 50 MG Tb24 tablet Commonly known as: MYRBETRIQ Take 50 mg by mouth daily.   OneTouch Delica Lancets 33G Misc Use as directed to check blood sugar   OneTouch Verio test strip Generic drug: glucose blood Check blood sugar 2 times daily   Ozempic (2 MG/DOSE) 8 MG/3ML Sopn Generic drug: Semaglutide (2 MG/DOSE) DIAL AND INJECT 2MG  UNDER THE SKIN ONCE WEEKLY   potassium chloride SA 20 MEQ tablet Commonly known as: Klor-Con M20 Take 1 tablet (20 mEq total) by mouth daily.   RHEUMATE PO Take by mouth.   Synthroid 175 MCG  tablet Generic drug: levothyroxine 1 tablet daily before breakfast with extra half tablet on Sundays        Allergies:  Allergies  Allergen Reactions   Atorvastatin Other (See Comments)    Other reaction(s): Other (See Comments) Muscle spasm     Past Medical History:  Diagnosis Date   Complication of anesthesia    Diabetes mellitus without complication (HCC)    Hypertension    Obesity    PONV (postoperative nausea and vomiting)    Thyroid disease    Hyperactive then was treated with iodine.     Past Surgical History:  Procedure Laterality Date   BREAST REDUCTION SURGERY  [id number]   CATARACT EXTRACTION Left 09/03/2022   CATARACT EXTRACTION Right 08/27/2022   MUSCLE BIOPSY Right 07/15/2019   Procedure: MUSCLE BIOPSY RIGHT DELTOID AND RIGHT RECTUS FEMORIS MUSCLE;  Surgeon: Gaynelle Adu, MD;  Location: [facility];  Service: General;  Laterality: Right;   REDUCTION MAMMAPLASTY Bilateral    Patient states 20 years ago    Family History  Problem Relation Age of Onset   Stroke Mother    Diabetes Mother    Diabetes Father    Diabetes Sister    Hypertension Sister    Hypertension Maternal Aunt    Cancer Maternal Uncle    Diabetes Paternal Aunt    Diabetes Paternal Uncle    Cancer Maternal Grandmother     Social History:  reports that she has never smoked. She has never used smokeless tobacco. She reports current alcohol use. She reports that she does not use drugs.  ROS  She has post ablative HYPOTHYROIDISM Since about 08/2019 she was taking Tirosint up to , was not controlled with Synthroid  previously  Subsequently she is on Synthroid, brand name  She is taking 175 mcg since 3/23, now taking 1 tablet daily  Does feel unusually tired for about the last month and goes to bed only, also feeling colder than usual TSH which was consistently normal is 8.6  She takes her Synthroid brand name by empty stomach in the morning, 30 minutes before breakfast She is taking  her calcium in the afternoon instead of at breakfast   Lab Results  Component Value Date   TSH 8.58 (H) 09/12/2022   TSH 1.71 05/14/2022   TSH 2.45 01/07/2022   FREET4 0.93 01/07/2022   FREET4 0.98 09/30/2021   FREET4 0.76 07/29/2021    HISTORY of inflammatory myositis, 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, was on prednisone along with methotrexate injectable with improvement in symptoms   HYPERTENSION treated with HCTZ 12.5 mg by PCP as also lisinopril She has  a BP monitor at home  BP Readings from Last 3 Encounters:  09/16/22 114/68  05/16/22 110/70  01/09/22 128/70    LIPIDS: LDL has been consistently over 100 previously and generally around 140 Previously on Zetia 10 mg daily since 5/23 but unclear whether she is taking this or not Nexlizet was denied by insurance  She is taking WelChol but not taking this daily, mostly every other day with powder She thinks she is taking her Zetia regularly LDL is improved but still high at 106  Lab Results  Component Value Date   CHOL 195 09/12/2022   CHOL 211 (H) 05/14/2022   Lab Results  Component Value Date   HDL 80.50 09/12/2022   HDL 75.30 05/14/2022   Lab Results  Component Value Date   LDLCALC 106 (H) 09/12/2022   LDLCALC 122 (H) 05/14/2022   Lab Results  Component Value Date   TRIG 43.0 09/12/2022   TRIG 67.0 05/14/2022   Lab Results  Component Value Date   CHOLHDL 2 09/12/2022   CHOLHDL 3 05/14/2022   Lab Results  Component Value Date   LDLDIRECT 109.0 01/07/2022      LABS:  Lab on 09/12/2022  Component Date Value Ref Range Status   Cholesterol 09/12/2022 195  0 - 200 mg/dL Final   ATP III Classification       Desirable:  < 200 mg/dL               Borderline High:  200 - 239 mg/dL          High:  > = 782 mg/dL   Triglycerides 95/62/1308 43.0  0.0 - 149.0 mg/dL Final   Normal:  <657 mg/dLBorderline High:  150 - 199 mg/dL   HDL 84/69/6295 28.41  >39.00 mg/dL Final   VLDL 32/44/0102 8.6  0.0 - 72.5 mg/dL Final   LDL  Cholesterol 09/12/2022 106 (H)  0 - 99 mg/dL Final   Total CHOL/HDL Ratio 09/12/2022 2   Final                  Men          Women1/2 Average Risk     3.4          3.3Average Risk          5.0          4.42X Average Risk          9.6          7.13X Average Risk          15.0          11.0                       NonHDL 09/12/2022 114.41   Final   NOTE:  Non-HDL goal should be 30 mg/dL higher than patient's LDL goal (i.e. LDL goal of < 70 mg/dL, would have non-HDL goal of < 100 mg/dL)   Sodium 36/64/4034 742  135 - 145 mEq/L Final   Potassium 09/12/2022 3.6  3.5 - 5.1 mEq/L Final   Chloride 09/12/2022 99  96 - 112 mEq/L Final   CO2 09/12/2022 29  19 - 32 mEq/L Final   Glucose, Bld 09/12/2022 88  70 - 99 mg/dL Final   BUN 59/56/3875 15  6 - 23 mg/dL Final   Creatinine, Ser 09/12/2022 0.85  0.40 - 1.20 mg/dL Final   Total Bilirubin 09/12/2022 0.6  0.2 - 1.2 mg/dL  Final   Alkaline Phosphatase 09/12/2022 99  39 - 117 U/L Final   AST 09/12/2022 32  0 - 37 U/L Final   ALT 09/12/2022 21  0 - 35 U/L Final   Total Protein 09/12/2022 7.6  6.0 - 8.3 g/dL Final   Albumin 21/30/8657 4.5  3.5 - 5.2 g/dL Final   GFR 84/69/6295 73.16  >60.00 mL/min Final   Calculated using the CKD-EPI Creatinine Equation (2021)   Calcium 09/12/2022 9.9  8.4 - 10.5 mg/dL Final   TSH 28/41/3244 8.58 (H)  0.35 - 5.50 uIU/mL Final   Hgb A1c MFr Bld 09/12/2022 6.0  4.6 - 6.5 % Final   Glycemic Control Guidelines for People with Diabetes:Non Diabetic:  <6%Goal of Therapy: <7%Additional Action Suggested:  >8%      Examination:   BP 114/68   Pulse 67   Ht 5\' 7"  (1.702 m)   Wt 215 lb (97.5 kg)   SpO2 96%   BMI 33.67 kg/m   Body mass index is 33.67 kg/m.    ASSESSMENT/ PLAN:    HYPOTHYROIDISM: She is taking brand-name Synthroid 175 mcg daily for over a year Although previously has had stable TSH levels she is now having symptoms of hypothyroidism TSH is over 8  She will continue to take brand-name Synthroid but take  200 mcg instead of 175 mcg, 7 tablets a week-morning  She will take this before breakfast daily.   She can continue taking calcium at lunch or dinner and WelChol at night   Diabetes type 2 with obesity:   A1c is 6  She is doing well on Ozempic 2 mg weekly Her blood sugar control is excellent and no increase in postprandial readings when checked  Recently because of lack of exercise she has gained a little weight She will try to get back on her walking program once she has better energy level Also try to check more readings after meals   HYPERLIPIDEMIA: She does need better control and as discussed may not be on a consistent regimen Also not clear if LDL may be higher partly from mild hypothyroidism  Will continue daily Zetia but needs to take her WelChol packets consistently every day at night   There are no Patient Instructions on file for this visit.   Reather Littler 09/16/2022, 9:08 AM

## 2022-10-19 ENCOUNTER — Other Ambulatory Visit: Payer: Self-pay | Admitting: Endocrinology

## 2022-10-19 DIAGNOSIS — E119 Type 2 diabetes mellitus without complications: Secondary | ICD-10-CM

## 2022-10-28 ENCOUNTER — Other Ambulatory Visit (INDEPENDENT_AMBULATORY_CARE_PROVIDER_SITE_OTHER): Payer: BC Managed Care – PPO

## 2022-10-28 DIAGNOSIS — E890 Postprocedural hypothyroidism: Secondary | ICD-10-CM | POA: Diagnosis not present

## 2022-10-28 LAB — TSH: TSH: 4.35 u[IU]/mL (ref 0.35–5.50)

## 2022-10-30 ENCOUNTER — Encounter: Payer: Self-pay | Admitting: Endocrinology

## 2022-11-01 ENCOUNTER — Other Ambulatory Visit: Payer: Self-pay | Admitting: Endocrinology

## 2022-11-01 DIAGNOSIS — E7800 Pure hypercholesterolemia, unspecified: Secondary | ICD-10-CM

## 2022-11-03 ENCOUNTER — Telehealth: Payer: Self-pay

## 2022-11-03 NOTE — Telephone Encounter (Signed)
Okay to refill   (KLOR-CON M20) 20 MEQ tablet

## 2022-11-04 NOTE — Telephone Encounter (Signed)
Andrea Delacruz is aware to have Klor Con refilled by PCP instead of Dr Lucianne Muss

## 2022-11-04 NOTE — Telephone Encounter (Signed)
Called [facility] and told pharmacist that Dr Lucianne Muss was not responsible for the Rx Postasssium /Klor Con that that needed to go through her PCP for further refills and patient is also aware

## 2022-12-03 ENCOUNTER — Other Ambulatory Visit: Payer: Self-pay | Admitting: Family Medicine

## 2022-12-03 DIAGNOSIS — Z1231 Encounter for screening mammogram for malignant neoplasm of breast: Secondary | ICD-10-CM

## 2023-01-01 ENCOUNTER — Other Ambulatory Visit (INDEPENDENT_AMBULATORY_CARE_PROVIDER_SITE_OTHER): Payer: BC Managed Care – PPO

## 2023-01-01 ENCOUNTER — Other Ambulatory Visit: Payer: Self-pay | Admitting: Endocrinology

## 2023-01-01 DIAGNOSIS — E890 Postprocedural hypothyroidism: Secondary | ICD-10-CM

## 2023-01-01 DIAGNOSIS — E119 Type 2 diabetes mellitus without complications: Secondary | ICD-10-CM

## 2023-01-01 LAB — COMPREHENSIVE METABOLIC PANEL
ALT: 24 U/L (ref 0–35)
AST: 39 U/L — ABNORMAL HIGH (ref 0–37)
Albumin: 4.5 g/dL (ref 3.5–5.2)
Alkaline Phosphatase: 80 U/L (ref 39–117)
BUN: 11 mg/dL (ref 6–23)
CO2: 30 mEq/L (ref 19–32)
Calcium: 10.2 mg/dL (ref 8.4–10.5)
Chloride: 102 mEq/L (ref 96–112)
Creatinine, Ser: 0.89 mg/dL (ref 0.40–1.20)
GFR: 69.09 mL/min (ref >60.00)
Glucose, Bld: 85 mg/dL (ref 70–99)
Potassium: 3.9 mEq/L (ref 3.5–5.1)
Sodium: 138 mEq/L (ref 135–145)
Total Bilirubin: 0.5 mg/dL (ref 0.2–1.2)
Total Protein: 7.5 g/dL (ref 6.0–8.3)

## 2023-01-01 LAB — T4, FREE: Free T4: 0.84 ng/dL (ref 0.60–1.60)

## 2023-01-01 LAB — HEMOGLOBIN A1C: Hgb A1c MFr Bld: 5.9 % (ref 4.6–6.5)

## 2023-01-01 LAB — TSH: TSH: 7.17 u[IU]/mL — ABNORMAL HIGH (ref 0.35–5.50)

## 2023-01-06 ENCOUNTER — Ambulatory Visit: Payer: BC Managed Care – PPO

## 2023-01-06 ENCOUNTER — Telehealth (INDEPENDENT_AMBULATORY_CARE_PROVIDER_SITE_OTHER): Payer: BC Managed Care – PPO | Admitting: Endocrinology

## 2023-01-06 ENCOUNTER — Encounter: Payer: Self-pay | Admitting: Endocrinology

## 2023-01-06 DIAGNOSIS — E119 Type 2 diabetes mellitus without complications: Secondary | ICD-10-CM

## 2023-01-06 DIAGNOSIS — E890 Postprocedural hypothyroidism: Secondary | ICD-10-CM

## 2023-01-06 DIAGNOSIS — E7800 Pure hypercholesterolemia, unspecified: Secondary | ICD-10-CM

## 2023-01-06 MED ORDER — SYNTHROID 25 MCG PO TABS: 25.0000 ug | ORAL_TABLET | Freq: Every day | ORAL | 3 refills | Status: DC

## 2023-01-06 NOTE — Progress Notes (Signed)
Patient ID: Andrea Delacruz, female   DOB: 1960/08/09, 62 y.o.   MRN: [id number]           Reason for Appointment: Endocrinology follow-up   I connected with the above-named patient by video enabled telemedicine application and verified that I am speaking with the correct person. The patient was explained the limitations of evaluation and management by telemedicine and the availability of in person appointments.  Patient also understood that there may be a patient responsible charge related to this service  Location of the patient: Patient's home  Location of the provider: Physician office Only the patient and myself were participating in the encounter The patient understood the above statements and agreed to proceed.   History of Present Illness   DIABETES type 2, diagnosis date:  2018  Previous history:  Likely had prediabetes for some time before she was told to have diabetes and started on Metformin No details of her history are available She thinks her blood sugars were relatively well controlled with Metformin 500 mg twice daily.  However her last 2 A1c levels have been 7.2 and 7.3 and she had been followed by her PCP  Recent history:     Non-insulin hypoglycemic drugs: Ozempic 2 mg weekly     Side effects from medications:  Diarrhea from high dose metformin  Current diabetes management, blood sugar patterns and problems identified:  Her A1c is stable at 5.9  She is doing well with 2 mg Ozempic  She reports consistently good blood sugar control with near normal blood sugars at home and highest reading only about 127 Because of fatigue she has not done any exercise Likely what her weight is recently Lab work is also fairly good at 85            Weight history:  Wt Readings from Last 3 Encounters:  09/16/22 215 lb (97.5 kg)  05/16/22 211 lb 6.4 oz (95.9 kg)  01/09/22 212 lb 12.8 oz (96.5 kg)         Diabetes labs:  Lab Results  Component Value Date   HGBA1C  5.9 01/01/2023   HGBA1C 6.0 09/12/2022   HGBA1C 6.0 05/14/2022   Lab Results  Component Value Date   MICROALBUR 0.8 05/14/2022   LDLCALC 106 (H) 09/12/2022   CREATININE 0.89 01/01/2023    Hypothyroidism and other issues: See review of systems   Allergies as of 01/06/2023       Reactions   Atorvastatin Other (See Comments)   Other reaction(s): Other (See Comments) Muscle spasm        Medication List        Accurate as of January 06, 2023 10:21 AM. If you have any questions, ask your nurse or doctor.          acetaminophen 500 MG tablet Commonly known as: TYLENOL Take 500 mg by mouth every 6 (six) hours as needed for mild pain or headache.   azaTHIOprine 50 MG tablet Commonly known as: IMURAN Take by mouth.   calcium carbonate 1250 (500 Ca) MG tablet Commonly known as: OS-CAL - dosed in mg of elemental calcium Take 1 tablet (500 mg of elemental calcium total) by mouth daily with breakfast.   cholecalciferol 25 MCG (1000 UNIT) tablet Commonly known as: VITAMIN D3 Take 1,000 Units by mouth daily.   Colesevelam HCl 3.75 g Pack Commonly known as: Welchol 1 pack daily   ezetimibe 10 MG tablet Commonly known as: Zetia Take 1 tablet (10 mg total)  by mouth daily.   folic acid 1 MG tablet Commonly known as: FOLVITE Take 1 mg by mouth 3 (three) times daily.   FreeStyle Libre 2 Sensor Misc 2 Devices by Does not apply route every 14 (fourteen) days.   Gemtesa 75 MG Tabs Generic drug: Vibegron Take 75 mg by mouth daily.   hydrochlorothiazide 25 MG tablet Commonly known as: HYDRODIURIL Take 25 mg by mouth daily.   levothyroxine 200 MCG tablet Commonly known as: SYNTHROID Take 1 tablet (200 mcg total) by mouth daily.   Synthroid 200 MCG tablet Generic drug: levothyroxine Take 1 tablet (200 mcg total) by mouth daily before breakfast.   lisinopril 10 MG tablet Commonly known as: ZESTRIL Take 10 mg by mouth daily.   methotrexate 250 MG/10ML  injection Inject into the skin.   mirabegron ER 50 MG Tb24 tablet Commonly known as: MYRBETRIQ Take 50 mg by mouth daily.   OneTouch Delica Lancets 33G Misc Use as directed to check blood sugar   OneTouch Verio test strip Generic drug: glucose blood USE TO CHECK BLOOD SUGAR 2 TIMES A DAY   Ozempic (2 MG/DOSE) 8 MG/3ML Sopn Generic drug: Semaglutide (2 MG/DOSE) DIAL AND INJECT 2MG  UNDER THE SKIN ONCE WEEKLY   RHEUMATE PO Take by mouth.        Allergies:  Allergies  Allergen Reactions   Atorvastatin Other (See Comments)    Other reaction(s): Other (See Comments) Muscle spasm     Past Medical History:  Diagnosis Date   Complication of anesthesia    Diabetes mellitus without complication (HCC)    Hypertension    Obesity    PONV (postoperative nausea and vomiting)    Thyroid disease    Hyperactive then was treated with iodine.     Past Surgical History:  Procedure Laterality Date   BREAST REDUCTION SURGERY  [id number]   CATARACT EXTRACTION Left 09/03/2022   CATARACT EXTRACTION Right 08/27/2022   MUSCLE BIOPSY Right 07/15/2019   Procedure: MUSCLE BIOPSY RIGHT DELTOID AND RIGHT RECTUS FEMORIS MUSCLE;  Surgeon: Gaynelle Adu, MD;  Location: [facility] OR;  Service: General;  Laterality: Right;   REDUCTION MAMMAPLASTY Bilateral    Patient states 20 years ago    Family History  Problem Relation Age of Onset   Stroke Mother    Diabetes Mother    Diabetes Father    Diabetes Sister    Hypertension Sister    Hypertension Maternal Aunt    Cancer Maternal Uncle    Diabetes Paternal Aunt    Diabetes Paternal Uncle    Cancer Maternal Grandmother     Social History:  reports that she has never smoked. She has never used smokeless tobacco. She reports current alcohol use. She reports that she does not use drugs.  ROS  She has post ablative HYPOTHYROIDISM Since about 08/2019 she was taking Tirosint up to , was not controlled with Synthroid  previously  Subsequently  she is on Synthroid, brand name  She is taking 200 mcg since April 2024  Does feel unusually tired for about the 6 weeks or so TSH which was previously normal is 8.2  She takes Synthroid brand name in the morning consistently without food or any other supplements or coffee, has not missed any doses Taking WelChol at night She is taking her calcium in the afternoon instead of at breakfast   Lab Results  Component Value Date   TSH 7.17 (H) 01/01/2023   TSH 4.35 10/28/2022   TSH 8.58 (H) 09/12/2022  FREET4 0.84 01/01/2023   FREET4 0.93 01/07/2022   FREET4 0.98 09/30/2021    HISTORY of inflammatory myositis, 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, was on prednisone, now treated with methotrexate injectable with improvement in symptoms   HYPERTENSION treated with HCTZ 12.5 mg by PCP as also lisinopril She has a BP monitor at home  BP Readings from Last 3 Encounters:  09/16/22 114/68  05/16/22 110/70  01/09/22 128/70    LIPIDS: LDL has been consistently over 100 previously and generally around 140 Nexlizet was denied by insurance  She is taking WelChol and Zetia She thinks she is taking her medications regularly LDL is improved but still high at 106 on her last visit  Lab Results  Component Value Date   CHOL 195 09/12/2022   CHOL 211 (H) 05/14/2022   Lab Results  Component Value Date   HDL 80.50 09/12/2022   HDL 75.30 05/14/2022   Lab Results  Component Value Date   LDLCALC 106 (H) 09/12/2022   LDLCALC 122 (H) 05/14/2022   Lab Results  Component Value Date   TRIG 43.0 09/12/2022   TRIG 67.0 05/14/2022   Lab Results  Component Value Date   CHOLHDL 2 09/12/2022   CHOLHDL 3 05/14/2022   Lab Results  Component Value Date   LDLDIRECT 109.0 01/07/2022      LABS:  Lab on 01/01/2023  Component Date Value Ref Range Status   Free T4 01/01/2023 0.84  0.60 - 1.60 ng/dL Final   Comment: Specimens from patients who are undergoing biotin therapy and /or ingesting biotin supplements  may contain high levels of biotin.  The higher biotin concentration in these specimens interferes with this Free T4 assay.  Specimens that contain high levels  of biotin may cause false high results for this Free T4 assay.  Please interpret results in light of the total clinical presentation of the patient.     TSH 01/01/2023 7.17 (H)  0.35 - 5.50 uIU/mL Final   Sodium 01/01/2023 138  135 - 145 mEq/L Final   Potassium 01/01/2023 3.9  3.5 - 5.1 mEq/L Final   Chloride 01/01/2023 102  96 - 112 mEq/L Final   CO2 01/01/2023 30  19 - 32 mEq/L Final   Glucose, Bld 01/01/2023 85  70 - 99 mg/dL Final   BUN 09/81/1914 11  6 - 23 mg/dL Final   Creatinine, Ser 01/01/2023 0.89  0.40 - 1.20 mg/dL Final   Total Bilirubin 01/01/2023 0.5  0.2 - 1.2 mg/dL Final   Alkaline Phosphatase 01/01/2023 80  39 - 117 U/L Final   AST 01/01/2023 39 (H)  0 - 37 U/L Final   ALT 01/01/2023 24  0 - 35 U/L Final   Total Protein 01/01/2023 7.5  6.0 - 8.3 g/dL Final   Albumin 78/29/5621 4.5  3.5 - 5.2 g/dL Final   GFR 30/86/5784 69.09  >60.00 mL/min Final   Calculated using the CKD-EPI Creatinine Equation (2021)   Calcium 01/01/2023 10.2  8.4 - 10.5 mg/dL Final   Hgb O9G MFr Bld 01/01/2023 5.9  4.6 - 6.5 % Final   Glycemic Control Guidelines for People with Diabetes:Non Diabetic:  <6%Goal of Therapy: <7%Additional Action Suggested:  >8%      Examination:   There were no vitals taken for this visit.  There is no height or weight on file to calculate BMI.    ASSESSMENT/ PLAN:    HYPOTHYROIDISM: She is taking brand-name Synthroid 200 mcg daily since April She is again having symptomatic hypothyroidism  and TSH over 7 She is not missing any doses and has not changed the way she takes her Synthroid  She will continue to take brand-name Synthroid but take additional 25 mg with her 200 mcg  We will need follow-up in 2 months   Diabetes type 2 with obesity:   A1c is 5.9  Blood sugar control is excellent with Ozempic 2  mg weekly  Blood sugar readings not available for review from her home monitor today Also not able to exercise because of fatigue   HYPERLIPIDEMIA: Will need follow-up on her last visit   There are no Patient Instructions on file for this visit.   Reather Littler 01/06/2023, 10:21 AM

## 2023-01-14 ENCOUNTER — Ambulatory Visit
Admission: RE | Admit: 2023-01-14 | Discharge: 2023-01-14 | Disposition: A | Discharge status: Home / Self Care | Payer: BC Managed Care – PPO | Source: Ambulatory Visit | Attending: Family Medicine | Admitting: Family Medicine

## 2023-01-14 DIAGNOSIS — Z1231 Encounter for screening mammogram for malignant neoplasm of breast: Secondary | ICD-10-CM

## 2023-02-27 ENCOUNTER — Other Ambulatory Visit: Payer: Self-pay | Admitting: Family Medicine

## 2023-02-27 ENCOUNTER — Ambulatory Visit
Admission: RE | Admit: 2023-02-27 | Discharge: 2023-02-27 | Disposition: A | Discharge status: Home / Self Care | Payer: BC Managed Care – PPO | Source: Ambulatory Visit | Attending: Family Medicine | Admitting: Family Medicine

## 2023-02-27 DIAGNOSIS — R053 Chronic cough: Secondary | ICD-10-CM

## 2023-04-03 ENCOUNTER — Other Ambulatory Visit: Payer: Self-pay

## 2023-04-03 DIAGNOSIS — E119 Type 2 diabetes mellitus without complications: Secondary | ICD-10-CM

## 2023-04-03 DIAGNOSIS — E7800 Pure hypercholesterolemia, unspecified: Secondary | ICD-10-CM

## 2023-04-03 DIAGNOSIS — E890 Postprocedural hypothyroidism: Secondary | ICD-10-CM

## 2023-04-07 ENCOUNTER — Encounter: Payer: Self-pay | Admitting: Endocrinology

## 2023-04-09 ENCOUNTER — Other Ambulatory Visit: Payer: BC Managed Care – PPO

## 2023-04-09 NOTE — Telephone Encounter (Signed)
Noted.  Will discuss in follow-up visit.

## 2023-04-10 ENCOUNTER — Other Ambulatory Visit (INDEPENDENT_AMBULATORY_CARE_PROVIDER_SITE_OTHER): Payer: BC Managed Care – PPO

## 2023-04-10 DIAGNOSIS — E119 Type 2 diabetes mellitus without complications: Secondary | ICD-10-CM | POA: Diagnosis not present

## 2023-04-10 DIAGNOSIS — E7800 Pure hypercholesterolemia, unspecified: Secondary | ICD-10-CM | POA: Diagnosis not present

## 2023-04-10 DIAGNOSIS — E890 Postprocedural hypothyroidism: Secondary | ICD-10-CM | POA: Diagnosis not present

## 2023-04-10 LAB — COMPREHENSIVE METABOLIC PANEL
ALT: 19 U/L (ref 0–35)
AST: 26 U/L (ref 0–37)
Albumin: 4.6 g/dL (ref 3.5–5.2)
Alkaline Phosphatase: 90 U/L (ref 39–117)
BUN: 9 mg/dL (ref 6–23)
CO2: 31 meq/L (ref 19–32)
Calcium: 10.1 mg/dL (ref 8.4–10.5)
Chloride: 102 meq/L (ref 96–112)
Creatinine, Ser: 0.81 mg/dL (ref 0.40–1.20)
GFR: 77.2 mL/min (ref >60.00)
Glucose, Bld: 79 mg/dL (ref 70–99)
Potassium: 3.7 meq/L (ref 3.5–5.1)
Sodium: 138 meq/L (ref 135–145)
Total Bilirubin: 0.7 mg/dL (ref 0.2–1.2)
Total Protein: 7.4 g/dL (ref 6.0–8.3)

## 2023-04-10 LAB — TSH: TSH: 2.79 u[IU]/mL (ref 0.35–5.50)

## 2023-04-10 LAB — LIPID PANEL
Cholesterol: 221 mg/dL — ABNORMAL HIGH (ref 0–200)
HDL: 78.9 mg/dL (ref >39.00)
LDL Cholesterol: 128 mg/dL — ABNORMAL HIGH (ref 0–99)
NonHDL: 141.92
Total CHOL/HDL Ratio: 3
Triglycerides: 70 mg/dL (ref 0.0–149.0)
VLDL: 14 mg/dL (ref 0.0–40.0)

## 2023-04-10 LAB — T4, FREE: Free T4: 0.92 ng/dL (ref 0.60–1.60)

## 2023-04-14 ENCOUNTER — Encounter: Payer: Self-pay | Admitting: Endocrinology

## 2023-04-14 ENCOUNTER — Ambulatory Visit: Payer: BC Managed Care – PPO | Admitting: Endocrinology

## 2023-04-14 VITALS — BP 108/80 | HR 74 | Resp 20 | Ht 67.0 in | Wt 215.4 lb

## 2023-04-14 DIAGNOSIS — E119 Type 2 diabetes mellitus without complications: Secondary | ICD-10-CM

## 2023-04-14 DIAGNOSIS — E890 Postprocedural hypothyroidism: Secondary | ICD-10-CM

## 2023-04-14 DIAGNOSIS — Z7985 Long-term (current) use of injectable non-insulin antidiabetic drugs: Secondary | ICD-10-CM

## 2023-04-14 LAB — POCT GLYCOSYLATED HEMOGLOBIN (HGB A1C): Hemoglobin A1C: 5.6 % (ref 4.0–5.6)

## 2023-04-14 MED ORDER — TIRZEPATIDE 10 MG/0.5ML ~~LOC~~ SOAJ: 10.0000 mg | SUBCUTANEOUS | 4 refills | Status: DC

## 2023-04-14 NOTE — Patient Instructions (Signed)
Change mounjaro 10 mg weekly, please let me know in 1 month, we can increase to 12.5mg  weekly.

## 2023-04-14 NOTE — Progress Notes (Signed)
Outpatient Endocrinology Note [place] Aneliese Beaudry, MD  04/14/23  Patient's Name: Andrea Delacruz    DOB: 1961/01/06    MRN: [id number]                                                    REASON OF VISIT: Follow up for type 2 diabetes mellitus /hypothyroidism  PCP: Jackelyn Poling, DO  HISTORY OF PRESENT ILLNESS:   Andrea Delacruz is a 62 y.o. old female with past medical history listed below, is here for follow up for type 2 diabetes mellitus /postablative hypothyroidism.  Patient was last seen by Dr. Lucianne Muss in July 2024.  Pertinent Diabetes History: Patient was diagnosed with type 2 diabetes mellitus in 2018.  No personal history of pancreatitis and / or family history of medullary thyroid carcinoma or MEN 2B syndrome.   Chronic Diabetes Complications : Retinopathy: no. Last ophthalmology exam was done on 04/02/2023, annually, following with ophthalmology regularly.  Nephropathy: no, on ACE/ARB /lisinopril. Peripheral neuropathy: no Coronary artery disease: no Stroke: no  Relevant comorbidities and cardiovascular risk factors: Obesity: yes Body mass index is 33.74 kg/m.  Hypertension: Yes  Hyperlipidemia : Yes, not on statin.  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  LIPIDS: LDL has been consistently over 100 previously and generally around 140. Nexlizet was denied by insurance.  She is taking Zetia WelChol.  Current / Home Diabetic regimen includes:  Ozempic 2 mg weekly.  Prior diabetic medications: Metformin stopped due to diarrhea with high dose.  Glycemic data:    One Touch Verio glucometer, download from November 12 to November 26, reviewed average blood sugar 114, she has been checking occasionally.  Some of the blood sugar 93, 110, 140.  Hypoglycemia: Patient has no hypoglycemic episodes. Patient has hypoglycemia awareness.  Factors modifying glucose control: 1.  Diabetic  diet assessment: 3 meals a day.  2.  Staying active or exercising:   3.  Medication compliance: compliant all of the time.  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 Since about 08/2019 she was taking Tirosint up to , was not controlled with Synthroid  previously. Subsequently she is on Synthroid, brand name.  Dose was adjusted periodically in the past.  Interval history  Hemoglobin A1c today 5.6%.  She has remained controlled on diabetes.  Patient is concerned about not able to lose weight.  Her weight has remained about the same after being on Ozempic.  She is currently taking Ozempic 2 mg weekly, tolerating well, denies any GI issues.  She has been taking levothyroxine 200 mcg daily, morning before breakfast and reports compliance.  Based on last office note by Dr. Lucianne Muss there is plan to increase levothyroxine to 225 mcg daily however she reports he has been taking 200 mcg daily since August.  Recent thyroid function test normal.  Recent laboratory results reviewed, LDL went up 128.  Normal electrolytes and stable renal function.  No other complaints today.  REVIEW OF SYSTEMS As per history of present illness.   PAST MEDICAL HISTORY: Past Medical History:  Diagnosis Date   Complication of anesthesia    Diabetes mellitus without complication (HCC)    Hypertension    Obesity    PONV (postoperative nausea and vomiting)    Thyroid disease  Hyperactive then was treated with iodine.     PAST SURGICAL HISTORY: Past Surgical History:  Procedure Laterality Date   BREAST REDUCTION SURGERY  [id number]   CATARACT EXTRACTION Left 09/03/2022   CATARACT EXTRACTION Right 08/27/2022   MUSCLE BIOPSY Right 07/15/2019   Procedure: MUSCLE BIOPSY RIGHT DELTOID AND RIGHT RECTUS FEMORIS MUSCLE;  Surgeon: Gaynelle Adu, MD;  Location: [facility] OR;  Service: General;  Laterality: Right;   REDUCTION MAMMAPLASTY Bilateral    Patient states 20 years ago     ALLERGIES: Allergies  Allergen Reactions   Atorvastatin Other (See Comments)    Other reaction(s): Other (See Comments) Muscle spasm     FAMILY HISTORY:  Family History  Problem Relation Age of Onset   Stroke Mother    Diabetes Mother    Diabetes Father    Diabetes Sister    Hypertension Sister    Hypertension Maternal Aunt    Cancer Maternal Uncle    Diabetes Paternal Aunt    Diabetes Paternal Uncle    Cancer Maternal Grandmother     SOCIAL HISTORY: Social History   Socioeconomic History   Marital status: Married    Spouse name: Not on file   Number of children: Not on file   Years of education: Not on file   Highest education level: Not on file  Occupational History   Not on file  Tobacco Use   Smoking status: Never   Smokeless tobacco: Never  Vaping Use   Vaping status: Never Used  Substance and Sexual Activity   Alcohol use: Yes    Comment: Once oer year around holidays   Drug use: No   Sexual activity: Yes  Other Topics Concern   Not on file  Social History Narrative   Marital Status:  Married Press photographer)    Children:  G2 Daughter (25) Recruitment consultant) Twins (Adam and Antiyonne)   Pets: Cat (01)    Living Situation: Lives with husband    Occupation:  School Principal -  Presenter, broadcasting    Education:  Best boy in Education    Tobacco Use/Exposure:  None    Alcohol Use:  Occasional   Drug Use:  None   Diet:  Regular   Exercise:  None   Hobbies: Reading          Social Determinants of Corporate investment banker Strain: Not on file  Food Insecurity: Not on file  Transportation Needs: Not on file  Physical Activity: Not on file  Stress: Not on file  Social Connections: Not on file    MEDICATIONS:  Current Outpatient Medications  Medication Sig Dispense Refill   acetaminophen (TYLENOL) 500 MG tablet Take 500 mg by mouth every 6 (six) hours as needed for mild pain or headache.     azaTHIOprine (IMURAN) 50 MG tablet Take by mouth.     calcium  carbonate (OS-CAL - DOSED IN MG OF ELEMENTAL CALCIUM) 1250 (500 Ca) MG tablet Take 1 tablet (500 mg of elemental calcium total) by mouth daily with breakfast. 30 tablet 0   cholecalciferol (VITAMIN D3) 25 MCG (1000 UNIT) tablet Take 1,000 Units by mouth daily.     Colesevelam HCl (WELCHOL) 3.75 g PACK 1 pack daily 30 each 2   Continuous Blood Gluc Sensor (FREESTYLE LIBRE 2 SENSOR) MISC 2 Devices by Does not apply route every 14 (fourteen) days. 2 each 3   Dietary Management Product (RHEUMATE PO) Take by mouth.     ezetimibe (ZETIA) 10 MG tablet Take 1 tablet (  10 mg total) by mouth daily. 90 tablet 3   folic acid (FOLVITE) 1 MG tablet Take 1 mg by mouth 3 (three) times daily.     GEMTESA 75 MG TABS Take 75 mg by mouth daily.     hydrochlorothiazide (HYDRODIURIL) 25 MG tablet Take 25 mg by mouth daily.      lisinopril (PRINIVIL,ZESTRIL) 10 MG tablet Take 10 mg by mouth daily.     methotrexate 250 MG/10ML injection Inject into the skin.     mirabegron ER (MYRBETRIQ) 50 MG TB24 tablet Take 50 mg by mouth daily.     OneTouch Delica Lancets 33G MISC Use as directed to check blood sugar 50 each 5   ONETOUCH VERIO test strip USE TO CHECK BLOOD SUGAR 2 TIMES A DAY 100 strip 3   Semaglutide, 2 MG/DOSE, (OZEMPIC, 2 MG/DOSE,) 8 MG/3ML SOPN DIAL AND INJECT 2MG  UNDER THE SKIN ONCE WEEKLY 3 mL 2   SYNTHROID 200 MCG tablet Take 1 tablet (200 mcg total) by mouth daily before breakfast. 30 tablet 2   SYNTHROID 25 MCG tablet Take 1 tablet (25 mcg total) by mouth daily before breakfast. 30 tablet 3   tirzepatide (MOUNJARO) 10 MG/0.5ML Pen Inject 10 mg into the skin once a week. 6 mL 4   No current facility-administered medications for this visit.    PHYSICAL EXAM: Vitals:   04/14/23 0915  BP: 108/80  Pulse: 74  Resp: 20  SpO2: 95%  Weight: 215 lb 6.4 oz (97.7 kg)  Height: 5\' 7"  (1.702 m)   Body mass index is 33.74 kg/m.  Wt Readings from Last 3 Encounters:  04/14/23 215 lb 6.4 oz (97.7 kg)  09/16/22  215 lb (97.5 kg)  05/16/22 211 lb 6.4 oz (95.9 kg)    General: Well developed, well nourished female in no apparent distress.  HEENT: AT/[place], no external lesions.  Eyes: Conjunctiva clear and no icterus. Neck: Neck supple  Lungs: Respirations not labored Neurologic: Alert, oriented, normal speech Extremities / Skin: Dry. No sores or rashes noted.  Psychiatric: Does not appear depressed or anxious  Diabetic Foot Exam - Simple   No data filed     LABS Reviewed Lab Results  Component Value Date   HGBA1C 5.6 04/14/2023   HGBA1C 5.9 01/01/2023   HGBA1C 6.0 09/12/2022   Lab Results  Component Value Date   FRUCTOSAMINE 234 01/03/2020   FRUCTOSAMINE 262 09/15/2019   Lab Results  Component Value Date   CHOL 221 (H) 04/10/2023   HDL 78.90 04/10/2023   LDLCALC 128 (H) 04/10/2023   LDLDIRECT 109.0 01/07/2022   TRIG 70.0 04/10/2023   CHOLHDL 3 04/10/2023   Lab Results  Component Value Date   MICRALBCREAT 0.4 05/14/2022   MICRALBCREAT 0.4 07/29/2021   Lab Results  Component Value Date   CREATININE 0.81 04/10/2023   Lab Results  Component Value Date   GFR 77.20 04/10/2023    ASSESSMENT / PLAN  1. Controlled type 2 diabetes mellitus without complication, without long-term current use of insulin (HCC)   2. Hypothyroidism, postradioiodine therapy     Diabetes Mellitus type 2, complicated by no known complications. - Diabetic status / severity: Controlled.  Lab Results  Component Value Date   HGBA1C 5.6 04/14/2023    - Hemoglobin A1c goal : <7%  Patient has controlled diabetes mellitus.  Her blood she did not have weight loss benefit with Ozempic.  She like to change.  - Medications: See below.  I) stop Ozempic. II) start  Mounjaro 10 mg weekly.  Asked to call our clinic in 1 week , if no side effects will increase to 12.5 mg weekly in between the follow-up visits.  - Home glucose testing: In the morning fasting daily. - Discussed/ Gave Hypoglycemia treatment  plan.  # Consult : not required at this time.   # Annual urine for microalbuminuria/ creatinine ratio, no microalbuminuria currently, continue ACE/ARB /lisinopril. Last  Lab Results  Component Value Date   MICRALBCREAT 0.4 05/14/2022    # Foot check nightly.  # Annual dilated diabetic eye exams.   - Diet: Make healthy diabetic food choices - Life style / activity / exercise: Discussed.  2. Blood pressure  -  BP Readings from Last 1 Encounters:  04/14/23 108/80    - Control is in target.  - No change in current plans.  3. Lipid status / Hyperlipidemia - Last  Lab Results  Component Value Date   LDLCALC 128 (H) 04/10/2023   - Continue on Zetia and Colesevelam.  History of myositis ?  Related with atorvastatin.  # Postablative hypothyroidism -Recent thyroid function test normal.  Continue current dose of Synthroid 200 mcg daily.  Will check thyroid lab in next follow-up visit.  Diagnoses and all orders for this visit:  Controlled type 2 diabetes mellitus without complication, without long-term current use of insulin (HCC) -     POCT glycosylated hemoglobin (Hb A1C) -     Hemoglobin A1c  Hypothyroidism, postradioiodine therapy -     T4, free -     TSH  Other orders -     tirzepatide (MOUNJARO) 10 MG/0.5ML Pen; Inject 10 mg into the skin once a week.    DISPOSITION Follow up in clinic in 3  months suggested.   All questions answered and patient verbalized understanding of the plan.  [place] Joeli Fenner, MD [facility] Endocrinology [facility] [address] [place], [address] [place], [place] [address] Phone # [phone]  At least part of this note was generated using voice recognition software. Inadvertent word errors may have occurred, which were not recognized during the proofreading process.

## 2023-04-20 ENCOUNTER — Telehealth: Payer: Self-pay

## 2023-04-20 ENCOUNTER — Other Ambulatory Visit (HOSPITAL_COMMUNITY): Payer: Self-pay

## 2023-04-20 NOTE — Telephone Encounter (Signed)
Pharmacy Patient Advocate Encounter   Received notification from CoverMyMeds that prior authorization for [facility] is required/requested.   Insurance verification completed.   The patient is insured through [facility] .   Per test claim: PA required; PA started via CoverMyMeds. KEY [id number] . Waiting for clinical questions to populate.

## 2023-04-21 ENCOUNTER — Other Ambulatory Visit (HOSPITAL_COMMUNITY): Payer: Self-pay

## 2023-04-21 NOTE — Telephone Encounter (Signed)
Clinical info has been submitted

## 2023-04-24 NOTE — Telephone Encounter (Signed)
Pharmacy Patient Advocate Encounter  Received notification from [facility] that Prior Authorization for Andrea Delacruz has been APPROVED from 04/20/23 to 04/19/26   PA #/Case ID/Reference #: [id number]

## 2023-05-05 ENCOUNTER — Other Ambulatory Visit: Payer: Self-pay

## 2023-05-05 DIAGNOSIS — E890 Postprocedural hypothyroidism: Secondary | ICD-10-CM

## 2023-05-05 DIAGNOSIS — E1165 Type 2 diabetes mellitus with hyperglycemia: Secondary | ICD-10-CM

## 2023-05-05 MED ORDER — SYNTHROID 25 MCG PO TABS: 25.0000 ug | ORAL_TABLET | Freq: Every day | ORAL | 3 refills | Status: DC

## 2023-05-05 MED ORDER — OZEMPIC (2 MG/DOSE) 8 MG/3ML ~~LOC~~ SOPN: PEN_INJECTOR | SUBCUTANEOUS | 2 refills | Status: DC

## 2023-05-11 ENCOUNTER — Encounter: Payer: Self-pay | Admitting: Endocrinology

## 2023-05-18 ENCOUNTER — Encounter: Payer: Self-pay | Admitting: Endocrinology

## 2023-05-18 DIAGNOSIS — E890 Postprocedural hypothyroidism: Secondary | ICD-10-CM

## 2023-05-21 NOTE — Telephone Encounter (Signed)
 I have placed order for thyroid function test.  Please arrange for lab visit.

## 2023-05-27 ENCOUNTER — Other Ambulatory Visit: Payer: Self-pay

## 2023-05-27 ENCOUNTER — Encounter: Payer: Self-pay | Admitting: Endocrinology

## 2023-05-27 DIAGNOSIS — E7800 Pure hypercholesterolemia, unspecified: Secondary | ICD-10-CM

## 2023-05-27 MED ORDER — EZETIMIBE 10 MG PO TABS: 10.0000 mg | ORAL_TABLET | Freq: Every day | ORAL | 3 refills | Status: DC

## 2023-06-22 ENCOUNTER — Encounter (INDEPENDENT_AMBULATORY_CARE_PROVIDER_SITE_OTHER): Payer: Self-pay | Admitting: Family Medicine

## 2023-06-23 ENCOUNTER — Encounter (INDEPENDENT_AMBULATORY_CARE_PROVIDER_SITE_OTHER): Payer: Self-pay | Admitting: Family Medicine

## 2023-06-23 ENCOUNTER — Ambulatory Visit (INDEPENDENT_AMBULATORY_CARE_PROVIDER_SITE_OTHER): Payer: 59 | Admitting: Family Medicine

## 2023-06-23 DIAGNOSIS — E1159 Type 2 diabetes mellitus with other circulatory complications: Secondary | ICD-10-CM | POA: Diagnosis not present

## 2023-06-23 DIAGNOSIS — I152 Hypertension secondary to endocrine disorders: Secondary | ICD-10-CM

## 2023-06-23 DIAGNOSIS — E66811 Obesity, class 1: Secondary | ICD-10-CM

## 2023-06-23 DIAGNOSIS — E1169 Type 2 diabetes mellitus with other specified complication: Secondary | ICD-10-CM

## 2023-06-23 DIAGNOSIS — Z7985 Long-term (current) use of injectable non-insulin antidiabetic drugs: Secondary | ICD-10-CM

## 2023-06-23 DIAGNOSIS — Z6834 Body mass index (BMI) 34.0-34.9, adult: Secondary | ICD-10-CM

## 2023-06-23 DIAGNOSIS — E669 Obesity, unspecified: Secondary | ICD-10-CM | POA: Diagnosis not present

## 2023-06-23 NOTE — Progress Notes (Signed)
 Andrea DOROTHA Jenkins, DO, ABFM, ABOM Bariatric physician [address] [place], [place], [place] [address] Office: [phone]  /  Fax: [phone]     Initial Evaluation:  Andrea Delacruz was seen in clinic today to evaluate for obesity. Andrea Delacruz is interested in losing weight to improve overall health and reduce the risk of weight related complications. Andrea Delacruz presents today to review Delacruz treatment options, initial physical assessment, and evaluation.      Andrea Delacruz was referred by: Self-Referral  When asked how has your weight affected you? Andrea Delacruz states: Has affected self-esteem, Contributed to orthopedic problems or mobility issues, Having fatigue, and Has affected mood   Contributing factors to her weight Delacruz: Reduced physical activity, Eating patterns, and thyroid  disease (at times, due to inconsistency)  Some associated conditions: Hypertension, Hyperlipidemia, Diabetes, and Other: hypothyroidism  Current nutrition plan: None  Current level of physical activity: Walking and goes to the gym about 1-3 times per week.   Current or previous pharmacotherapy: GLP-1 and GLP-1 + GIP  Response to medication:  No weight loss with these injectable diabetes medications.    Barriers to weight loss that patient expresses a concern about today: presence of obesogenic drugs.  Previously was on steroids for a short period of time.   Pt hopes to accomplishment: Adopt healthier eating patterns and have an accountability partner.   Pt previously went to [facility] in the recent past, within the last year. Andrea Delacruz was on a low calorie restrictive diet and taking beta-HCG appetite suppressant drops. Andrea Delacruz reports being unable to maintain her weight loss.   Past Medical History:  Diagnosis Date   Complication of anesthesia    Diabetes mellitus without complication (HCC)    Hypertension    Obesity    PONV (postoperative nausea and vomiting)    Thyroid  disease    Hyperactive then was treated with  iodine.      Objective:  BP 102/70   Pulse 77   Temp 98.3 F (36.8 C)   Ht 5' 6 (1.676 m)   Wt 214 lb (97.1 kg)   SpO2 96%   BMI 34.54 kg/m  Andrea Delacruz was weighed on the bioimpedance scale: Body mass index is 34.54 kg/m.  Visceral Fat %: 12 , Body Fat %:   42.9%     Vitals Temp: 98.3 F (36.8 C) BP: 102/70 Pulse Rate: 77 SpO2: 96 %   Anthropometric Measurements Height: 5' 6 (1.676 m) Weight: 214 lb (97.1 kg) BMI (Calculated): 34.56 Peak Weight: 225lb   Body Composition  Body Fat %: 42.9 % Fat Mass (lbs): 91.8 lbs Muscle Mass (lbs): 116.2 lbs Total Body Water  (lbs): 85.8 lbs Visceral Fat Rating : 12   Other Clinical Data Comments: info session     General: Well Developed, well nourished, and in no acute distress.  HEENT: Normocephalic, atraumatic; EOMI, sclerae are anicteric. Skin: Warm and dry, good turgor Chest:  Normal excursion, shape, no gross ABN Respiratory: No conversational dyspnea; speaking in full sentences NeuroM-Sk:  Normal gross ROM * 4 extremities  Psych: A and O *3, insight adequate, mood- full    Assessment and Plan:   FOR THE DISEASE OF OBESITY: Obesity (BMI 30.0-34.9) - Current BMI 34.56 Assessment & Plan: We reviewed anthropometrics, biometrics, associated medical conditions and contributing factors with patient. Andrea Delacruz would benefit from a medically tailored reduced calorie nutrional plan based on his REE (resting energy expenditure), which will be determined by indirect calorimetry.  We will also assess for cardiometabolic risk and nutritional  derangements via fasting labs at intake appointment.    Obesity Treatment / Action Plan:   Andrea Delacruz was weighed on the bioimpedance scale and results were discussed and documented in the synopsis.   Andrea Delacruz will complete provided nutritional and psychosocial assessment questionnaire before the next appointment.  Andrea Delacruz will be scheduled for indirect calorimetry to determine resting energy  expenditure in a fasting state.  This will allow us  to create a reduced calorie, high-protein meal plan to promote loss of fat mass while preserving muscle mass.  We will also assess for cardiometabolic risk and nutritional derangements via an ECG and fasting serologies at her next appointment.  Andrea Delacruz was encouraged to work on amassing support from family and friends to begin their weight loss journey.   Work on eliminating or reducing the presence of highly processed, poorly nutritious, calorie-dense foods in the home.   Obesity Education Performed Today:  Patient was counseled on nutritional approaches to weight loss and benefits of reducing processed foods and consuming plant-based foods and high quality protein as part of nutritional weight management Delacruz.   We discussed the importance of long term lifestyle changes which include nutrition, exercise and behavioral modifications as well as the importance of customizing this to her specific health and social needs.   We discussed the benefits of reaching a healthier weight to alleviate the symptoms of existing conditions and reduce the risks of the biomechanical, metabolic and psychological effects of obesity.  Was counseled on the health benefits of losing 5%-10% of total body weight.  Was counseled on our cognitive behavorial therapy Delacruz, lead by our bariatric psychologist, who focuses on emotional eating and creating positive behavorial Delacruz.  Was counseled on bariatric pharmacotherapy and how this may be used as an adjunct in their weight management    Andrea Delacruz and states they are ready to start intensive lifestyle modifications and behavioral modifications.  It was recommended that Andrea Delacruz follow up in the next 1-2 weeks to review the above steps, and to continue with treatment of their chronic disease state of obesity   FOR OTHER CONDITIONS RELATED TO THE DISEASE OF OBESITY: Type 2  diabetes mellitus with morbid obesity [facility]) Assessment & Plan: Lab Results  Component Value Date   HGBA1C 5.6 04/14/2023   HGBA1C 5.9 01/01/2023   HGBA1C 6.0 09/12/2022   Condition dx about 5-6 years ago. Last A1C was well controlled at 5.6 on 03/2023. Prior A1C of 6.7 in 07/2021; pt states Andrea Delacruz was on Prednisone  for myositis at the time. Currently on Mounjaro  10 mg once weekly, started about 2 months ago. Previously on Ozempic  and Metformin , states Andrea Delacruz changed medications due to find meds that will also help her with weight loss. Newly established with Dr. Mercie of endocrinology, for management of diabetes and hypothyroidism. Was previously established with Dr. Von.   Recommended pt to continue to follow up with her PCP/specialists. Discussed how losing weight can help improve chronic conditions such as diabetes. We will continue to monitor her condition should Andrea Delacruz.    Hypertension associated with diabetes [facility]) Assessment & Plan: BP Readings from Last 3 Encounters:  06/23/23 102/70  04/14/23 108/80  09/16/22 114/68   BP is well controlled. Currently on HCTZ 25 mg once daily and Lisinopril  10 mg once daily. Tolerating well with no side effects reported. No acute concerns reported today.   Continue with current antihypertensive treatment. Advised pt to continue  to follow up with her PCP/specialists as instructed by them for further management of condition. Will continue to monitor condition alongside these individuals if pt desires to join Delacruz.     Attestations:   Reviewed by clinician on day of visit: allergies, medications, problem list, medical history, surgical history, family history, social history, and previous encounter notes pertinent to obesity diagnosis. 52 minutes was spent today on this visit including the above counseling, pre-visit chart review, and post-visit documentation.  Over 50% of this time was spent in direct, face-to-face  counseling and coordination of care  I, Vernell Forest, acting as a medical scribe for Andrea Jenkins, DO., have compiled all relevant documentation for today's office visit on behalf of Andrea Jenkins, DO, while in the presence of [employer], DO.  I have reviewed the above documentation for accuracy and completeness, and I agree with the above. Andrea JINNY Delacruz, D.O.  The 21st Century Cures Act was signed into law in 2016 which includes the topic of electronic health records.  This provides immediate access to information in MyChart.  This includes consultation notes, operative notes, office notes, lab results and pathology reports.  If you have any questions about what you read please let us  know at your next visit so we can discuss your concerns and take corrective action if need be.  We are right here with you!

## 2023-07-09 ENCOUNTER — Other Ambulatory Visit: Payer: BC Managed Care – PPO

## 2023-07-10 ENCOUNTER — Other Ambulatory Visit: Payer: Self-pay

## 2023-07-10 ENCOUNTER — Other Ambulatory Visit: Payer: BC Managed Care – PPO

## 2023-07-10 LAB — T4, FREE: Free T4: 1.3 ng/dL (ref 0.8–1.8)

## 2023-07-10 LAB — TSH: TSH: 0.25 m[IU]/L — ABNORMAL LOW (ref 0.40–4.50)

## 2023-07-13 ENCOUNTER — Encounter: Payer: Self-pay | Admitting: Endocrinology

## 2023-07-15 ENCOUNTER — Encounter: Payer: Self-pay | Admitting: Endocrinology

## 2023-07-15 ENCOUNTER — Ambulatory Visit: Payer: 59 | Admitting: Endocrinology

## 2023-07-15 VITALS — BP 122/88 | HR 85 | Resp 20 | Ht 66.0 in | Wt 216.2 lb

## 2023-07-15 DIAGNOSIS — Z7985 Long-term (current) use of injectable non-insulin antidiabetic drugs: Secondary | ICD-10-CM | POA: Diagnosis not present

## 2023-07-15 DIAGNOSIS — E7800 Pure hypercholesterolemia, unspecified: Secondary | ICD-10-CM | POA: Diagnosis not present

## 2023-07-15 DIAGNOSIS — E119 Type 2 diabetes mellitus without complications: Secondary | ICD-10-CM

## 2023-07-15 DIAGNOSIS — E890 Postprocedural hypothyroidism: Secondary | ICD-10-CM | POA: Diagnosis not present

## 2023-07-15 LAB — POCT GLYCOSYLATED HEMOGLOBIN (HGB A1C): Hemoglobin A1C: 5.4 % (ref 4.0–5.6)

## 2023-07-15 MED ORDER — TIRZEPATIDE 12.5 MG/0.5ML ~~LOC~~ SOAJ: 12.5000 mg | SUBCUTANEOUS | 4 refills | Status: DC

## 2023-07-15 NOTE — Progress Notes (Signed)
 Outpatient Endocrinology Note [place] Kadelyn Dimascio, MD  07/15/23  Patient's Name: Andrea Delacruz    DOB: Sep 11, 1960    MRN: [id number]                                                    REASON OF VISIT: Follow up for type 2 diabetes mellitus / hypothyroidism  PCP: Jackelyn Poling, DO  HISTORY OF PRESENT ILLNESS:   Andrea Delacruz is a 63 y.o. old female with past medical history listed below, is here for follow up for type 2 diabetes mellitus /postablative hypothyroidism.  Patient was last seen by Dr. Lucianne Muss in July 2024.  Pertinent Diabetes History: Patient was previously seen by Dr. Lucianne Muss and was last time seen in July 2024.  Patient was diagnosed with type 2 diabetes mellitus in 2018.  No personal history of pancreatitis and / or family history of medullary thyroid carcinoma or MEN 2B syndrome.   Chronic Diabetes Complications : Retinopathy: no. Last ophthalmology exam was done on 04/02/2023, annually, following with ophthalmology regularly.  Nephropathy: no, on ACE/ARB /lisinopril. Peripheral neuropathy: no Coronary artery disease: no Stroke: no  Relevant comorbidities and cardiovascular risk factors: Obesity: yes Body mass index is 34.9 kg/m.  Hypertension: Yes  Hyperlipidemia : Yes, not on statin.  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  LIPIDS: LDL has been consistently over 100 previously and generally around 140. Nexlizet was denied by insurance.  She is taking Zetia WelChol.  Current / Home Diabetic regimen includes:  Mounjaro 10mg  weekly.  Prior diabetic medications: Metformin stopped due to diarrhea with high dose.  Glycemic data:   One Touch Verio glucometer data reviewed from February 12 to February 26, average blood sugar 99.  Lowest blood sugar 98 and highest blood sugar 100.  She has been checking blood sugar occasionally.  Hypoglycemia: Patient has no  hypoglycemic episodes. Patient has hypoglycemia awareness.  Factors modifying glucose control: 1.  Diabetic diet assessment: 3 meals a day.  2.  Staying active or exercising:   3.  Medication compliance: compliant all of the time.  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 Since about 08/2019 she was taking Tirosint up to , was not controlled with Synthroid  previously. Subsequently she is on Synthroid, brand name.  Dose was adjusted periodically in the past.  Interval history  Hemoglobin A1c today 5.4%, remained controlled.  She has been taking Mounjaro 10 mg weekly.  She has not lost weight from the last visit.  Denies any GI upset, tolerating Mounjaro well.  She has been taking Synthroid/levothyroxine 200 mcg daily.  Takes in the early morning before breakfast.  Reports compliance.  Denies palpitation or heat intolerance.  She complains of fatigue otherwise no hypo and hyperthyroid symptoms.  Recent thyroid lab with mildly low TSH and normal free T4 as follows.   Latest Reference Range & Units 07/10/23 11:24  TSH 0.40 - 4.50 mIU/L 0.25 (L)  T4,Free(Direct) 0.8 - 1.8 ng/dL 1.3  (L): Data is abnormally low  REVIEW OF SYSTEMS As per history of present illness.   PAST MEDICAL HISTORY: Past Medical History:  Diagnosis Date   Complication of anesthesia    Diabetes mellitus without complication (HCC)    Hypertension    Obesity  PONV (postoperative nausea and vomiting)    Thyroid disease    Hyperactive then was treated with iodine.     PAST SURGICAL HISTORY: Past Surgical History:  Procedure Laterality Date   BREAST REDUCTION SURGERY  [id number]   CATARACT EXTRACTION Left 09/03/2022   CATARACT EXTRACTION Right 08/27/2022   MUSCLE BIOPSY Right 07/15/2019   Procedure: MUSCLE BIOPSY RIGHT DELTOID AND RIGHT RECTUS FEMORIS MUSCLE;  Surgeon: Gaynelle Adu, MD;  Location: [facility] OR;  Service: General;  Laterality: Right;   REDUCTION MAMMAPLASTY Bilateral     Patient states 20 years ago    ALLERGIES: Allergies  Allergen Reactions   Atorvastatin Other (See Comments)    Other reaction(s): Other (See Comments) Muscle spasm     FAMILY HISTORY:  Family History  Problem Relation Age of Onset   Stroke Mother    Diabetes Mother    Diabetes Father    Diabetes Sister    Hypertension Sister    Hypertension Maternal Aunt    Cancer Maternal Uncle    Diabetes Paternal Aunt    Diabetes Paternal Uncle    Cancer Maternal Grandmother     SOCIAL HISTORY: Social History   Socioeconomic History   Marital status: Married    Spouse name: Not on file   Number of children: Not on file   Years of education: Not on file   Highest education level: Not on file  Occupational History   Not on file  Tobacco Use   Smoking status: Never   Smokeless tobacco: Never  Vaping Use   Vaping status: Never Used  Substance and Sexual Activity   Alcohol use: Yes    Comment: Once oer year around holidays   Drug use: No   Sexual activity: Yes  Other Topics Concern   Not on file  Social History Narrative   Marital Status:  Married Press photographer)    Children:  G2 Daughter (39) Recruitment consultant) Twins (Adam and Antiyonne)   Pets: Cat (01)    Living Situation: Lives with husband    Occupation:  School Principal -  Presenter, broadcasting    Education:  Best boy in Education    Tobacco Use/Exposure:  None    Alcohol Use:  Occasional   Drug Use:  None   Diet:  Regular   Exercise:  None   Hobbies: Reading          Social Drivers of Corporate investment banker Strain: Not on file  Food Insecurity: Not on file  Transportation Needs: Not on file  Physical Activity: Not on file  Stress: Not on file  Social Connections: Not on file    MEDICATIONS:  Current Outpatient Medications  Medication Sig Dispense Refill   acetaminophen (TYLENOL) 500 MG tablet Take 500 mg by mouth every 6 (six) hours as needed for mild pain or headache.     azaTHIOprine (IMURAN) 50 MG tablet Take  by mouth.     calcium carbonate (OS-CAL - DOSED IN MG OF ELEMENTAL CALCIUM) 1250 (500 Ca) MG tablet Take 1 tablet (500 mg of elemental calcium total) by mouth daily with breakfast. 30 tablet 0   cholecalciferol (VITAMIN D3) 25 MCG (1000 UNIT) tablet Take 1,000 Units by mouth daily.     Colesevelam HCl (WELCHOL) 3.75 g PACK 1 pack daily 30 each 2   Continuous Blood Gluc Sensor (FREESTYLE LIBRE 2 SENSOR) MISC 2 Devices by Does not apply route every 14 (fourteen) days. 2 each 3   Dietary Management Product (RHEUMATE PO) Take by  mouth.     ezetimibe (ZETIA) 10 MG tablet Take 1 tablet (10 mg total) by mouth daily. 90 tablet 3   folic acid (FOLVITE) 1 MG tablet Take 1 mg by mouth 3 (three) times daily.     GEMTESA 75 MG TABS Take 75 mg by mouth daily.     hydrochlorothiazide (HYDRODIURIL) 25 MG tablet Take 25 mg by mouth daily.      losartan (COZAAR) 25 MG tablet Take 25 mg by mouth daily.     methotrexate 250 MG/10ML injection Inject into the skin.     mirabegron ER (MYRBETRIQ) 50 MG TB24 tablet Take 50 mg by mouth daily.     OneTouch Delica Lancets 33G MISC Use as directed to check blood sugar 50 each 5   ONETOUCH VERIO test strip USE TO CHECK BLOOD SUGAR 2 TIMES A DAY 100 strip 3   SYNTHROID 200 MCG tablet Take 1 tablet (200 mcg total) by mouth daily before breakfast. 30 tablet 2   SYNTHROID 25 MCG tablet Take 1 tablet (25 mcg total) by mouth daily before breakfast. 30 tablet 3   tirzepatide (MOUNJARO) 12.5 MG/0.5ML Pen Inject 12.5 mg into the skin once a week. 2 mL 4   No current facility-administered medications for this visit.    PHYSICAL EXAM: Vitals:   07/15/23 0911  BP: 122/88  Pulse: 85  Resp: 20  SpO2: 98%  Weight: 216 lb 3.2 oz (98.1 kg)  Height: 5\' 6"  (1.676 m)    Body mass index is 34.9 kg/m.  Wt Readings from Last 3 Encounters:  07/15/23 216 lb 3.2 oz (98.1 kg)  06/23/23 214 lb (97.1 kg)  04/14/23 215 lb 6.4 oz (97.7 kg)    General: Well developed, well nourished  female in no apparent distress.  HEENT: AT/[place], no external lesions.  Eyes: Conjunctiva clear and no icterus. Neck: Neck supple  Lungs: Respirations not labored Neurologic: Alert, oriented, normal speech Extremities / Skin: Dry.   Psychiatric: Does not appear depressed or anxious  Diabetic Foot Exam - Simple   No data filed     LABS Reviewed Lab Results  Component Value Date   HGBA1C 5.4 07/15/2023   HGBA1C 5.6 04/14/2023   HGBA1C 5.9 01/01/2023   Lab Results  Component Value Date   FRUCTOSAMINE 234 01/03/2020   FRUCTOSAMINE 262 09/15/2019   Lab Results  Component Value Date   CHOL 221 (H) 04/10/2023   HDL 78.90 04/10/2023   LDLCALC 128 (H) 04/10/2023   LDLDIRECT 109.0 01/07/2022   TRIG 70.0 04/10/2023   CHOLHDL 3 04/10/2023   Lab Results  Component Value Date   MICRALBCREAT 0.4 05/14/2022   MICRALBCREAT 0.4 07/29/2021   Lab Results  Component Value Date   CREATININE 0.81 04/10/2023   Lab Results  Component Value Date   GFR 77.20 04/10/2023    ASSESSMENT / PLAN  1. Controlled type 2 diabetes mellitus without complication, without long-term current use of insulin (HCC)   2. Hypothyroidism, postradioiodine therapy   3. Hypercholesterolemia     Diabetes Mellitus type 2, complicated by no known complications. - Diabetic status / severity: Controlled.  Lab Results  Component Value Date   HGBA1C 5.4 07/15/2023    - Hemoglobin A1c goal : <7%  Patient has controlled diabetes mellitus, will gradually increase Mounjaro to the maximum dose to have weight loss benefit.  - Medications: See below.  I) Increase Mounjaro from 10 mg to 12.5 mg weekly.  Asked to call our clinic in 1  month, if no side effects will increase to 15 mg weekly in between the follow-up visits.  - Home glucose testing: In the morning fasting few times a week. - Discussed/ Gave Hypoglycemia treatment plan.  # Consult : not required at this time.   # Annual urine for microalbuminuria/  creatinine ratio, no microalbuminuria currently, continue ACE/ARB /lisinopril.  Will check urine microalbumin creatinine ratio today. Last  Lab Results  Component Value Date   MICRALBCREAT 0.4 05/14/2022    # Foot check nightly.  # Annual dilated diabetic eye exams.   - Diet: Make healthy diabetic food choices.  - Life style / activity / exercise: Discussed.  2. Blood pressure  -  BP Readings from Last 1 Encounters:  07/15/23 122/88    - Control is in target.  - No change in current plans.  3. Lipid status / Hyperlipidemia - Last  Lab Results  Component Value Date   LDLCALC 128 (H) 04/10/2023   - Continue on Zetia and Colesevelam.  History of myositis ?  Related with atorvastatin. -Will check lipid panel in next follow-up visit.  # Postablative hypothyroidism -She is currently taking Synthroid 200 mcg daily.  Recent lab with mildly low TSH.  She has no hyperthyroid symptoms however has fatigue.  She had normal thyroid function test on current dose.  Will not change the dose of levothyroxine at this time. -Will check thyroid function test at next follow-up visit.  If TSH remains low will decrease the dose of levothyroxine/Synthroid at that time.  Diagnoses and all orders for this visit:  Controlled type 2 diabetes mellitus without complication, without long-term current use of insulin (HCC) -     tirzepatide (MOUNJARO) 12.5 MG/0.5ML Pen; Inject 12.5 mg into the skin once a week. -     POCT glycosylated hemoglobin (Hb A1C) -     Microalbumin / creatinine urine ratio -     BASIC METABOLIC PANEL WITH GFR -     Hemoglobin A1c  Hypothyroidism, postradioiodine therapy -     T4, free -     TSH  Hypercholesterolemia -     Lipid panel     DISPOSITION Follow up in clinic in 3  months suggested.  Labs prior to follow-up visit.   All questions answered and patient verbalized understanding of the plan.  [place] Shadow Schedler, MD [facility] Endocrinology [facility]  [facility] [address] [place], [address] [place], [place] [address] Phone # [phone]  At least part of this note was generated using voice recognition software. Inadvertent word errors may have occurred, which were not recognized during the proofreading process.

## 2023-07-15 NOTE — Patient Instructions (Signed)
 Increase mounjaro 12.5 mg weekly, please let me know in 1 month, we can increase to 15mg  weekly. Call clinic.

## 2023-07-16 LAB — MICROALBUMIN / CREATININE URINE RATIO
Creatinine, Urine: 354 mg/dL — ABNORMAL HIGH (ref 20–275)
Microalb Creat Ratio: 3 mg/g{creat} (ref <30)
Microalb, Ur: 1 mg/dL

## 2023-08-11 ENCOUNTER — Encounter: Payer: Self-pay | Admitting: Endocrinology

## 2023-08-11 DIAGNOSIS — E119 Type 2 diabetes mellitus without complications: Secondary | ICD-10-CM

## 2023-08-11 MED ORDER — TIRZEPATIDE 15 MG/0.5ML ~~LOC~~ SOAJ: 15.0000 mg | SUBCUTANEOUS | 4 refills | Status: DC

## 2023-08-11 NOTE — Telephone Encounter (Signed)
 I have sent prescription for Mounjaro 15 mg weekly.

## 2023-09-18 ENCOUNTER — Institutional Professional Consult (permissible substitution) (HOSPITAL_BASED_OUTPATIENT_CLINIC_OR_DEPARTMENT_OTHER): Payer: BC Managed Care – PPO | Admitting: Internal Medicine

## 2023-09-22 ENCOUNTER — Ambulatory Visit (HOSPITAL_BASED_OUTPATIENT_CLINIC_OR_DEPARTMENT_OTHER): Admitting: Internal Medicine

## 2023-09-22 VITALS — BP 104/55 | HR 72 | Ht 66.0 in | Wt 216.7 lb

## 2023-09-22 DIAGNOSIS — E785 Hyperlipidemia, unspecified: Secondary | ICD-10-CM | POA: Diagnosis not present

## 2023-09-22 DIAGNOSIS — E119 Type 2 diabetes mellitus without complications: Secondary | ICD-10-CM

## 2023-09-22 DIAGNOSIS — M3320 Polymyositis, organ involvement unspecified: Secondary | ICD-10-CM | POA: Diagnosis not present

## 2023-09-22 NOTE — Progress Notes (Signed)
 LIPID CLINIC CONSULT NOTE  Chief Complaint:  Manage dyslipidemia  Primary Care Physician: Mordechai April, DO  Primary Cardiologist:  None  HPI:  Andrea Delacruz is a 63 y.o. female who is being seen today for the evaluation of dyslipidemia at the request of Mordechai April, DO. This is a pleasant 63 year old female kindly referred for evaluation and management of dyslipidemia.  She has previously tried atorvastatin and had muscle aches with this.  She has been managed otherwise on the ezetimibe .  Recent labs show total cholesterol 221, triglycerides 70, HDL 78 and LDL 128.  She was initially diagnosed with diabetes however has had substantial weight loss and more recently started on Mounjaro.  Her recent A1c was only 5.4%.  She does have a history of hypertension however that is also well-managed with blood pressure today 104/55.  She has no known cardiovascular disease.  There is no history of early onset heart disease, however diabetes and hypertension run in the family.  PMHx:  Past Medical History:  Diagnosis Date   Complication of anesthesia    Diabetes mellitus without complication (HCC)    Hypertension    Obesity    PONV (postoperative nausea and vomiting)    Thyroid  disease    Hyperactive then was treated with iodine.     Past Surgical History:  Procedure Laterality Date   BREAST REDUCTION SURGERY  [id number]   CATARACT EXTRACTION Left 09/03/2022   CATARACT EXTRACTION Right 08/27/2022   MUSCLE BIOPSY Right 07/15/2019   Procedure: MUSCLE BIOPSY RIGHT DELTOID AND RIGHT RECTUS FEMORIS MUSCLE;  Surgeon: Aldean Hummingbird, MD;  Location: [facility] OR;  Service: General;  Laterality: Right;   REDUCTION MAMMAPLASTY Bilateral    Patient states 20 years ago    FAMHx:  Family History  Problem Relation Age of Onset   Stroke Mother    Diabetes Mother    Diabetes Father    Diabetes Sister    Hypertension Sister    Hypertension Maternal Aunt    Cancer Maternal Uncle    Diabetes  Paternal Aunt    Diabetes Paternal Uncle    Cancer Maternal Grandmother     SOCHx:   reports that she has never smoked. She has never used smokeless tobacco. She reports current alcohol use. She reports that she does not use drugs.  ALLERGIES:  Allergies  Allergen Reactions   Atorvastatin Other (See Comments)    Other reaction(s): Other (See Comments) Muscle spasm     ROS: Pertinent items noted in HPI and remainder of comprehensive ROS otherwise negative.  HOME MEDS: Current Outpatient Medications on File Prior to Visit  Medication Sig Dispense Refill   acetaminophen  (TYLENOL ) 500 MG tablet Take 500 mg by mouth every 6 (six) hours as needed for mild pain or headache.     azaTHIOprine (IMURAN) 50 MG tablet Take by mouth.     calcium  carbonate (OS-CAL - DOSED IN MG OF ELEMENTAL CALCIUM ) 1250 (500 Ca) MG tablet Take 1 tablet (500 mg of elemental calcium  total) by mouth daily with breakfast. 30 tablet 0   cholecalciferol  (VITAMIN D3) 25 MCG (1000 UNIT) tablet Take 1,000 Units by mouth daily.     Colesevelam  HCl (WELCHOL ) 3.75 g PACK 1 pack daily 30 each 2   Continuous Blood Gluc Sensor (FREESTYLE LIBRE 2 SENSOR) MISC 2 Devices by Does not apply route every 14 (fourteen) days. 2 each 3   Dietary Management Product (RHEUMATE PO) Take by mouth.     ezetimibe  (ZETIA ) 10 MG  tablet Take 1 tablet (10 mg total) by mouth daily. 90 tablet 3   folic acid (FOLVITE) 1 MG tablet Take 1 mg by mouth 3 (three) times daily.     GEMTESA 75 MG TABS Take 75 mg by mouth daily.     hydrochlorothiazide  (HYDRODIURIL ) 25 MG tablet Take 25 mg by mouth daily.      losartan (COZAAR) 25 MG tablet Take 25 mg by mouth daily.     methotrexate 250 MG/10ML injection Inject into the skin.     OneTouch Delica Lancets 33G MISC Use as directed to check blood sugar 50 each 5   ONETOUCH VERIO test strip USE TO CHECK BLOOD SUGAR 2 TIMES A DAY 100 strip 3   riTUXimab 1,000 mg in sodium chloride  0.9 % Inject 1,000 mg into the  vein every 6 (six) months. Patient receives in fusion twice a year     SYNTHROID  200 MCG tablet Take 1 tablet (200 mcg total) by mouth daily before breakfast. 30 tablet 2   tirzepatide (MOUNJARO) 15 MG/0.5ML Pen Inject 15 mg into the skin once a week. 6 mL 4   mirabegron  ER (MYRBETRIQ ) 50 MG TB24 tablet Take 50 mg by mouth daily. (Patient not taking: Reported on 09/22/2023)     SYNTHROID  25 MCG tablet Take 1 tablet (25 mcg total) by mouth daily before breakfast. (Patient not taking: Reported on 09/22/2023) 30 tablet 3   No current facility-administered medications on file prior to visit.    LABS/IMAGING: No results found for this or any previous visit (from the past 48 hours). No results found.  LIPID PANEL:    Component Value Date/Time   CHOL 221 (H) 04/10/2023 1026   TRIG 70.0 04/10/2023 1026   HDL 78.90 04/10/2023 1026   CHOLHDL 3 04/10/2023 1026   VLDL 14.0 04/10/2023 1026   LDLCALC 128 (H) 04/10/2023 1026   LDLDIRECT 109.0 01/07/2022 0833    WEIGHTS: Wt Readings from Last 3 Encounters:  09/22/23 216 lb 11.2 oz (98.3 kg)  07/15/23 216 lb 3.2 oz (98.1 kg)  06/23/23 214 lb (97.1 kg)    VITALS: BP (!) 104/55 (BP Location: Left Arm, Patient Position: Sitting, Cuff Size: Normal)   Pulse 72   Ht 5\' 6"  (1.676 m)   Wt 216 lb 11.2 oz (98.3 kg)   SpO2 100%   BMI 34.98 kg/m   EXAM: Deferred  EKG: Deferred  ASSESSMENT: Dyslipidemia History of type 2 diabetes, A1c 5.4% Obesity with recent weight loss History of polymyositis  PLAN: 1.   Ms. Andrea Delacruz has a dyslipidemia with recent weight loss now on Mounjaro.  Her diabetes has improved with A1c 5.4%.  She is on ezetimibe .  She cannot tolerate atorvastatin.  In fact, she has a history of polymyositis with CKs over 20,000 apparently in the past.  She has been on rituximab every 6 months.  Statins are therefore contraindicated.  Since she has had recent weight loss and dietary changes I would like to get a calcium  score to further  stratify her.  If she has 0 coronary calcium  and her lipids are improving, particular with weight loss I would likely continue her ezetimibe  to target her LDL to less than 100.  If there is coronary calcium  then she will likely need additional therapy and we might consider adding Nexletol to her Zetia  for additional lipid-lowering.  We would need to monitor CK closely on this therapy although it is less likely to affect the CK than the statin.  Follow-up  based on the findings of her calcium  score.  Thanks for the kind referral.  Hazle Lites, MD, [facility], FNLA, FACP  [place]  [facility]  Medical Director of the Advanced Lipid Disorders &  Cardiovascular Risk Reduction Clinic Diplomate of the American Board of Clinical Lipidology Attending Cardiologist  Direct Dial: [phone]  Fax: [phone]  Website:  [email or web address] Kanda Deluna 09/22/2023, 12:42 PM

## 2023-09-22 NOTE — Patient Instructions (Signed)
 Medication Instructions:  Your physician recommends that you continue on your current medications as directed. Please refer to the Current Medication list given to you today.  *If you need a refill on your cardiac medications before your next appointment, please call your pharmacy*  Testing/Procedures: Dr. Maximo Spar has ordered a CT coronary calcium  score.   Test locations:  [facility]  [facility]  This is $99 out of pocket.   Coronary CalciumScan A coronary calcium  scan is an imaging test used to look for deposits of calcium  and other fatty materials (plaques) in the inner lining of the blood vessels of the heart (coronary arteries). These deposits of calcium  and plaques can partly clog and narrow the coronary arteries without producing any symptoms or warning signs. This puts a person at risk for a heart attack. This test can detect these deposits before symptoms develop. Tell a health care provider about: Any allergies you have. All medicines you are taking, including vitamins, herbs, eye drops, creams, and over-the-counter medicines. Any problems you or family members have had with anesthetic medicines. Any blood disorders you have. Any surgeries you have had. Any medical conditions you have. Whether you are pregnant or may be pregnant. What are the risks? Generally, this is a safe procedure. However, problems may occur, including: Harm to a pregnant woman and her unborn baby. This test involves the use of radiation. Radiation exposure can be dangerous to a pregnant woman and her unborn baby. If you are pregnant, you generally should not have this procedure done. Slight increase in the risk of cancer. This is because of the radiation involved in the test. What happens before the procedure? No preparation is needed for this procedure. What happens during the procedure? You will undress and  remove any jewelry around your neck or chest. You will put on a hospital gown. Sticky electrodes will be placed on your chest. The electrodes will be connected to an electrocardiogram (ECG) machine to record a tracing of the electrical activity of your heart. A CT scanner will take pictures of your heart. During this time, you will be asked to lie still and hold your breath for 2-3 seconds while a picture of your heart is being taken. The procedure may vary among health care providers and hospitals. What happens after the procedure? You can get dressed. You can return to your normal activities. It is up to you to get the results of your test. Ask your health care provider, or the department that is doing the test, when your results will be ready. Summary A coronary calcium  scan is an imaging test used to look for deposits of calcium  and other fatty materials (plaques) in the inner lining of the blood vessels of the heart (coronary arteries). Generally, this is a safe procedure. Tell your health care provider if you are pregnant or may be pregnant. No preparation is needed for this procedure. A CT scanner will take pictures of your heart. You can return to your normal activities after the scan is done. This information is not intended to replace advice given to you by your health care provider. Make sure you discuss any questions you have with your health care provider. Document Released: 11/01/2007 Document Revised: 03/24/2016 Document Reviewed: 03/24/2016 Elsevier Interactive Patient Education  2017 [employer].   Follow-Up: At [facility], you and your health needs are our priority.  As part of our continuing mission to  provide you with exceptional heart care, our providers are all part of one team.  This team includes your primary Cardiologist (physician) and Advanced Practice Providers or APPs (Physician Assistants and Nurse Practitioners) who all work together to provide you with  the care you need, when you need it.  Your next appointment:   TBD  Provider:   Dr. Maximo Spar

## 2023-10-08 ENCOUNTER — Other Ambulatory Visit: Payer: 59

## 2023-10-09 ENCOUNTER — Ambulatory Visit: Payer: Self-pay | Admitting: Endocrinology

## 2023-10-09 LAB — BASIC METABOLIC PANEL WITH GFR
BUN: 14 mg/dL (ref 7–25)
CO2: 31 mmol/L (ref 20–32)
Calcium: 9.7 mg/dL (ref 8.6–10.4)
Chloride: 103 mmol/L (ref 98–110)
Creat: 0.81 mg/dL (ref 0.50–1.05)
Glucose, Bld: 94 mg/dL (ref 65–99)
Potassium: 4.6 mmol/L (ref 3.5–5.3)
Sodium: 139 mmol/L (ref 135–146)
eGFR: 81 mL/min/{1.73_m2} (ref >=60)

## 2023-10-09 LAB — HEMOGLOBIN A1C
Hgb A1c MFr Bld: 5.7 % — ABNORMAL HIGH (ref <5.7)
Mean Plasma Glucose: 117 mg/dL
eAG (mmol/L): 6.5 mmol/L

## 2023-10-09 LAB — TSH: TSH: 3.49 m[IU]/L (ref 0.40–4.50)

## 2023-10-09 LAB — LIPID PANEL
Cholesterol: 203 mg/dL — ABNORMAL HIGH (ref <200)
HDL: 77 mg/dL (ref >=50)
LDL Cholesterol (Calc): 111 mg/dL — ABNORMAL HIGH
Non-HDL Cholesterol (Calc): 126 mg/dL (ref <130)
Total CHOL/HDL Ratio: 2.6 (calc) (ref <5.0)
Triglycerides: 64 mg/dL (ref <150)

## 2023-10-09 LAB — T4, FREE: Free T4: 1.3 ng/dL (ref 0.8–1.8)

## 2023-10-14 ENCOUNTER — Ambulatory Visit: Payer: 59 | Admitting: Endocrinology

## 2023-10-14 ENCOUNTER — Encounter: Payer: Self-pay | Admitting: Endocrinology

## 2023-10-14 VITALS — BP 112/72 | HR 84 | Resp 20 | Ht 66.0 in | Wt 210.4 lb

## 2023-10-14 DIAGNOSIS — Z7985 Long-term (current) use of injectable non-insulin antidiabetic drugs: Secondary | ICD-10-CM

## 2023-10-14 DIAGNOSIS — E890 Postprocedural hypothyroidism: Secondary | ICD-10-CM

## 2023-10-14 DIAGNOSIS — E119 Type 2 diabetes mellitus without complications: Secondary | ICD-10-CM

## 2023-10-14 NOTE — Progress Notes (Signed)
 Outpatient Endocrinology Note [place] Madalene Mickler, MD  10/14/23  Patient's Name: Andrea Delacruz    DOB: May 21, 1960    MRN: [id number]                                                    REASON OF VISIT: Follow up for type 2 diabetes mellitus / hypothyroidism  PCP: Mordechai April, DO  HISTORY OF PRESENT ILLNESS:   Andrea Delacruz is a 63 y.o. old female with past medical history listed below, is here for follow up for type 2 diabetes mellitus /postablative hypothyroidism.  Patient was last seen by Dr. Hubert Madden in July 2024.  Pertinent Diabetes History: Patient was previously seen by Dr. Hubert Madden and was last time seen in July 2024.  Patient was diagnosed with type 2 diabetes mellitus in 2018.  No personal history of pancreatitis and / or family history of medullary thyroid  carcinoma or MEN 2B syndrome.   Chronic Diabetes Complications : Retinopathy: no. Last ophthalmology exam was done on 04/02/2023, annually, following with ophthalmology regularly.  Nephropathy: no, on ACE/ARB /lisinopril . Peripheral neuropathy: no Coronary artery disease: no Stroke: no  Relevant comorbidities and cardiovascular risk factors: Obesity: yes Body mass index is 33.96 kg/m.  Hypertension: Yes  Hyperlipidemia : Yes, not on statin.  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  LIPIDS: LDL has been consistently over 100 previously and generally around 140. Nexlizet  was denied by insurance.  She is taking Zetia  WelChol .  Current / Home Diabetic regimen includes:  Mounjaro 12.5 mg weekly.  Prior diabetic medications: Metformin  stopped due to diarrhea with high dose.  Glycemic data:   One Touch Verio glucometer data reviewed from May 14 to Oct 14, 2023, average blood sugar 90.  Highest blood sugar 97 and lowest blood sugar 82.  She has been checking blood sugar occasionally few times a week some of the blood sugar 97,  82, 91.  Hypoglycemia: Patient has no hypoglycemic episodes. Patient has hypoglycemia awareness.  Factors modifying glucose control: 1.  Diabetic diet assessment: 3 meals a day.  2.  Staying active or exercising:   3.  Medication compliance: compliant all of the time.  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 Since about 08/2019 she was taking Tirosint  up to 175mcg, was not controlled with Synthroid   previously. Subsequently she is on Synthroid , brand name.  Dose was adjusted periodically in the past.  Lately taking Synthroid  200 mcg daily.  Interval history  Hemoglobin A1c 5.7%.  Recent laboratory results reviewed.  Normal electrolytes, stable renal function.  Normal thyroid  function test.  Cholesterol level with improvement on LDL 111.  Diabetes regimen as reviewed above, she is still on Mounjaro 12.5 mg weekly.  She lost 6 pounds of weight in last 3 months.  Denies any GI issues.  She is going to increase Mounjaro to 15 mg weekly from next week.  She has been taking Synthroid  200 mcg daily, denies palpitation and heat intolerance, taking properly.  She has normal thyroid  function test recently.  Occasional /rare numbness in the feet otherwise no complaints today.  REVIEW OF SYSTEMS As per history of present illness.   PAST MEDICAL HISTORY: Past Medical History:  Diagnosis Date   Complication of anesthesia    Diabetes mellitus  without complication (HCC)    Hypertension    Obesity    PONV (postoperative nausea and vomiting)    Thyroid  disease    Hyperactive then was treated with iodine.     PAST SURGICAL HISTORY: Past Surgical History:  Procedure Laterality Date   BREAST REDUCTION SURGERY  [id number]   CATARACT EXTRACTION Left 09/03/2022   CATARACT EXTRACTION Right 08/27/2022   MUSCLE BIOPSY Right 07/15/2019   Procedure: MUSCLE BIOPSY RIGHT DELTOID AND RIGHT RECTUS FEMORIS MUSCLE;  Surgeon: Aldean Hummingbird, MD;  Location[facility];  Service: General;   Laterality: Right;   REDUCTION MAMMAPLASTY Bilateral    Patient states 20 years ago    ALLERGIES: Allergies  Allergen Reactions   Atorvastatin Other (See Comments)    Other reaction(s): Other (See Comments) Muscle spasm     FAMILY HISTORY:  Family History  Problem Relation Age of Onset   Stroke Mother    Diabetes Mother    Diabetes Father    Diabetes Sister    Hypertension Sister    Hypertension Maternal Aunt    Cancer Maternal Uncle    Diabetes Paternal Aunt    Diabetes Paternal Uncle    Cancer Maternal Grandmother     SOCIAL HISTORY: Social History   Socioeconomic History   Marital status: Married    Spouse name: Not on file   Number of children: Not on file   Years of education: Not on file   Highest education level: Not on file  Occupational History   Not on file  Tobacco Use   Smoking status: Never   Smokeless tobacco: Never  Vaping Use   Vaping status: Never Used  Substance and Sexual Activity   Alcohol use: Yes    Comment: Once oer year around holidays   Drug use: No   Sexual activity: Yes  Other Topics Concern   Not on file  Social History Narrative   Marital Status:  Married Press photographer)    Children:  G2 Daughter (73) Recruitment consultant) Twins (Adam and Antiyonne)   Pets: Cat (01)    Living Situation: Lives with husband    Occupation:  School Principal -  Presenter, broadcasting    Education:  Best boy in Education    Tobacco Use/Exposure:  None    Alcohol Use:  Occasional   Drug Use:  None   Diet:  Regular   Exercise:  None   Hobbies: Reading          Social Drivers of Corporate investment banker Strain: Not on file  Food Insecurity: Not on file  Transportation Needs: Not on file  Physical Activity: Not on file  Stress: Not on file  Social Connections: Not on file    MEDICATIONS:  Current Outpatient Medications  Medication Sig Dispense Refill   acetaminophen  (TYLENOL ) 500 MG tablet Take 500 mg by mouth every 6 (six) hours as needed for mild pain or  headache.     azaTHIOprine (IMURAN) 50 MG tablet Take by mouth.     calcium  carbonate (OS-CAL - DOSED IN MG OF ELEMENTAL CALCIUM ) 1250 (500 Ca) MG tablet Take 1 tablet (500 mg of elemental calcium  total) by mouth daily with breakfast. 30 tablet 0   cholecalciferol  (VITAMIN D3) 25 MCG (1000 UNIT) tablet Take 1,000 Units by mouth daily.     Colesevelam  HCl (WELCHOL ) 3.75 g PACK 1 pack daily 30 each 2   Continuous Blood Gluc Sensor (FREESTYLE LIBRE 2 SENSOR) MISC 2 Devices by Does not apply route every 14 (  fourteen) days. 2 each 3   Dietary Management Product (RHEUMATE PO) Take by mouth.     ezetimibe  (ZETIA ) 10 MG tablet Take 1 tablet (10 mg total) by mouth daily. 90 tablet 3   folic acid (FOLVITE) 1 MG tablet Take 1 mg by mouth 3 (three) times daily.     GEMTESA 75 MG TABS Take 75 mg by mouth daily.     hydrochlorothiazide  (HYDRODIURIL ) 25 MG tablet Take 25 mg by mouth daily.      losartan (COZAAR) 25 MG tablet Take 25 mg by mouth daily.     methotrexate 250 MG/10ML injection Inject into the skin.     mirabegron  ER (MYRBETRIQ ) 50 MG TB24 tablet Take 50 mg by mouth daily.     OneTouch Delica Lancets 33G MISC Use as directed to check blood sugar 50 each 5   ONETOUCH VERIO test strip USE TO CHECK BLOOD SUGAR 2 TIMES A DAY 100 strip 3   riTUXimab 1,000 mg in sodium chloride  0.9 % Inject 1,000 mg into the vein every 6 (six) months. Patient receives in fusion twice a year     SYNTHROID  200 MCG tablet Take 1 tablet (200 mcg total) by mouth daily before breakfast. 30 tablet 2   tirzepatide (MOUNJARO) 15 MG/0.5ML Pen Inject 15 mg into the skin once a week. 6 mL 4   No current facility-administered medications for this visit.    PHYSICAL EXAM: Vitals:   10/14/23 0933  BP: 112/72  Pulse: 84  Resp: 20  SpO2: 99%  Weight: 210 lb 6.4 oz (95.4 kg)  Height: 5\' 6"  (1.676 m)    Body mass index is 33.96 kg/m.  Wt Readings from Last 3 Encounters:  10/14/23 210 lb 6.4 oz (95.4 kg)  09/22/23 216 lb  11.2 oz (98.3 kg)  07/15/23 216 lb 3.2 oz (98.1 kg)    General: Well developed, well nourished female in no apparent distress.  HEENT: AT/[place], no external lesions.  Eyes: Conjunctiva clear and no icterus. Neck: Neck supple  Lungs: Respirations not labored Neurologic: Alert, oriented, normal speech Extremities / Skin: Dry.   Psychiatric: Does not appear depressed or anxious  Diabetic Foot Exam - Simple   Simple Foot Form Diabetic Foot exam was performed with the following findings: Yes 10/14/2023  9:44 AM  Visual Inspection No deformities, no ulcerations, no other skin breakdown bilaterally: Yes Sensation Testing Intact to touch and monofilament testing bilaterally: Yes Pulse Check Posterior Tibialis and Dorsalis pulse intact bilaterally: Yes Comments     LABS Reviewed Lab Results  Component Value Date   HGBA1C 5.7 (H) 10/08/2023   HGBA1C 5.4 07/15/2023   HGBA1C 5.6 04/14/2023   Lab Results  Component Value Date   FRUCTOSAMINE 234 01/03/2020   FRUCTOSAMINE 262 09/15/2019   Lab Results  Component Value Date   CHOL 203 (H) 10/08/2023   HDL 77 10/08/2023   LDLCALC 111 (H) 10/08/2023   LDLDIRECT 109.0 01/07/2022   TRIG 64 10/08/2023   CHOLHDL 2.6 10/08/2023   Lab Results  Component Value Date   MICRALBCREAT 3 07/15/2023   MICRALBCREAT 0.4 05/14/2022   Lab Results  Component Value Date   CREATININE 0.81 10/08/2023   Lab Results  Component Value Date   GFR 77.20 04/10/2023    ASSESSMENT / PLAN  1. Controlled type 2 diabetes mellitus without complication, without long-term current use of insulin  (HCC)   2. Hypothyroidism, postradioiodine therapy      Diabetes Mellitus type 2, complicated by no known  complications. - Diabetic status / severity: Controlled.  Lab Results  Component Value Date   HGBA1C 5.7 (H) 10/08/2023    - Hemoglobin A1c goal : <6.5%  Patient has controlled diabetes mellitus, will gradually increase Mounjaro to the maximum dose to  have weight loss benefit.  - Medications: See below.  I) Increase Mounjaro from 12 mg to 15 mg weekly.    - Home glucose testing: In the morning fasting few times a week. - Discussed/ Gave Hypoglycemia treatment plan.  # Consult : not required at this time.   # Annual urine for microalbuminuria/ creatinine ratio, no microalbuminuria currently, continue ACE/ARB /lisinopril . Last  Lab Results  Component Value Date   MICRALBCREAT 3 07/15/2023    # Foot check nightly.  # Annual dilated diabetic eye exams.   - Diet: Make healthy diabetic food choices.  - Life style / activity / exercise: Discussed.  2. Blood pressure  -  BP Readings from Last 1 Encounters:  10/14/23 112/72    - Control is in target.  - No change in current plans.  3. Lipid status / Hyperlipidemia - Last  Lab Results  Component Value Date   LDLCALC 111 (H) 10/08/2023   - Continue on Zetia  and Colesevelam .  History of myositis ?  Related with atorvastatin. - Acceptable LDL.  # Postablative hypothyroidism -She is currently taking Synthroid  200 mcg daily.  Normal thyroid  function test recently, will continue the current dose.  Diagnoses and all orders for this visit:  Controlled type 2 diabetes mellitus without complication, without long-term current use of insulin  (HCC)  Hypothyroidism, postradioiodine therapy    DISPOSITION Follow up in clinic in 4  months suggested.    All questions answered and patient verbalized understanding of the plan.  [place] Johonna Binette, MD [facility] Endocrinology [facility] [address] [place], [address] [place], [place] [address] Phone # [phone]  At least part of this note was generated using voice recognition software. Inadvertent word errors may have occurred, which were not recognized during the proofreading process.

## 2023-11-04 ENCOUNTER — Ambulatory Visit: Payer: Self-pay | Admitting: Internal Medicine

## 2023-11-04 ENCOUNTER — Ambulatory Visit (HOSPITAL_BASED_OUTPATIENT_CLINIC_OR_DEPARTMENT_OTHER)
Admission: RE | Admit: 2023-11-04 | Discharge: 2023-11-04 | Disposition: A | Discharge status: Home / Self Care | Payer: Self-pay | Source: Ambulatory Visit | Attending: Internal Medicine | Admitting: Internal Medicine

## 2023-11-04 DIAGNOSIS — E785 Hyperlipidemia, unspecified: Secondary | ICD-10-CM | POA: Insufficient documentation

## 2023-12-08 ENCOUNTER — Other Ambulatory Visit: Payer: Self-pay | Admitting: Family Medicine

## 2023-12-08 ENCOUNTER — Encounter: Payer: Self-pay | Admitting: Family Medicine

## 2023-12-08 DIAGNOSIS — R519 Headache, unspecified: Secondary | ICD-10-CM

## 2023-12-14 ENCOUNTER — Ambulatory Visit
Admission: RE | Admit: 2023-12-14 | Discharge: 2023-12-14 | Disposition: A | Discharge status: Home / Self Care | Source: Ambulatory Visit | Attending: Family Medicine | Admitting: Family Medicine

## 2023-12-14 ENCOUNTER — Other Ambulatory Visit: Payer: Self-pay | Admitting: Family Medicine

## 2023-12-14 DIAGNOSIS — Z0000 Encounter for general adult medical examination without abnormal findings: Secondary | ICD-10-CM

## 2023-12-14 DIAGNOSIS — R519 Headache, unspecified: Secondary | ICD-10-CM

## 2024-01-04 ENCOUNTER — Institutional Professional Consult (permissible substitution) (HOSPITAL_BASED_OUTPATIENT_CLINIC_OR_DEPARTMENT_OTHER): Admitting: Internal Medicine

## 2024-01-15 ENCOUNTER — Ambulatory Visit
Admission: RE | Admit: 2024-01-15 | Discharge: 2024-01-15 | Disposition: A | Discharge status: Home / Self Care | Source: Ambulatory Visit | Attending: Family Medicine | Admitting: Family Medicine

## 2024-01-15 DIAGNOSIS — Z0000 Encounter for general adult medical examination without abnormal findings: Secondary | ICD-10-CM

## 2024-01-27 ENCOUNTER — Other Ambulatory Visit: Payer: Self-pay

## 2024-01-27 DIAGNOSIS — E890 Postprocedural hypothyroidism: Secondary | ICD-10-CM

## 2024-01-27 MED ORDER — SYNTHROID 200 MCG PO TABS: 200.0000 ug | ORAL_TABLET | Freq: Every day | ORAL | 2 refills | Status: AC

## 2024-02-15 ENCOUNTER — Encounter: Payer: Self-pay | Admitting: Endocrinology

## 2024-02-15 ENCOUNTER — Ambulatory Visit: Payer: Self-pay | Admitting: Endocrinology

## 2024-02-15 ENCOUNTER — Ambulatory Visit: Admitting: Endocrinology

## 2024-02-15 VITALS — BP 116/70 | HR 71 | Resp 20 | Ht 66.0 in | Wt 198.4 lb

## 2024-02-15 DIAGNOSIS — Z7985 Long-term (current) use of injectable non-insulin antidiabetic drugs: Secondary | ICD-10-CM | POA: Diagnosis not present

## 2024-02-15 DIAGNOSIS — E7800 Pure hypercholesterolemia, unspecified: Secondary | ICD-10-CM | POA: Diagnosis not present

## 2024-02-15 DIAGNOSIS — E119 Type 2 diabetes mellitus without complications: Secondary | ICD-10-CM

## 2024-02-15 DIAGNOSIS — E890 Postprocedural hypothyroidism: Secondary | ICD-10-CM | POA: Diagnosis not present

## 2024-02-15 LAB — POCT GLYCOSYLATED HEMOGLOBIN (HGB A1C): Hemoglobin A1C: 5.3 % (ref 4.0–5.6)

## 2024-02-15 MED ORDER — COLESEVELAM HCL 3.75 G PO PACK: PACK | ORAL | 3 refills | Status: AC

## 2024-02-15 NOTE — Progress Notes (Signed)
 Outpatient Endocrinology Note [place] Koraima Albertsen, MD  02/15/24  Patient's Name: Andrea Delacruz    DOB: 11-10-1960    MRN: [id number]                                                    REASON OF VISIT: Follow up for type 2 diabetes mellitus / hypothyroidism  PCP: Dayna Motto, DO  HISTORY OF PRESENT ILLNESS:   Andrea Delacruz is a 63 y.o. old female with past medical history listed below, is here for follow up for type 2 diabetes mellitus /postablative hypothyroidism.  Patient was last seen by Dr. Von in July 2024.  Pertinent Diabetes History: Patient was previously seen by Dr. Von and was last time seen in July 2024.  Patient was diagnosed with type 2 diabetes mellitus in 2018.  No personal history of pancreatitis and / or family history of medullary thyroid  carcinoma or MEN 2B syndrome.   Chronic Diabetes Complications : Retinopathy: no. Last ophthalmology exam was done on 04/02/2023, annually, following with ophthalmology regularly.  Nephropathy: no, on ACE/ARB /lisinopril . Peripheral neuropathy: no Coronary artery disease: no Stroke: no  Relevant comorbidities and cardiovascular risk factors: Obesity: yes Body mass index is 32.02 kg/m.  Hypertension: Yes  Hyperlipidemia : Yes, not on statin.  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  LIPIDS: LDL has been consistently over 100 previously and generally around 140. Nexlizet  was denied by insurance.  She is taking Zetia  WelChol .  Current / Home Diabetic regimen includes:  Mounjaro  15 mg weekly.  Prior diabetic medications: Metformin  stopped due to diarrhea with high dose.  Glycemic data:   One Touch Verio glucometer data reviewed from September 15 to September 29 , 2025, average blood sugar 87.  Hypoglycemia: Patient has no hypoglycemic episodes. Patient has hypoglycemia awareness.  Factors modifying glucose control: 1.   Diabetic diet assessment: 3 meals a day.  2.  Staying active or exercising:   3.  Medication compliance: compliant all of the time.  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 Since about 08/2019 she was taking Tirosint  up to , was not controlled with Synthroid   previously. Subsequently she is on Synthroid , brand name.  Dose was adjusted periodically in the past.  Lately taking Synthroid  200 mcg daily.  Interval history  Hemoglobin A1c 5.3% today.  She lost 12 pounds of weight since last visit after increasing dose of Mounjaro .  She has been tolerating Mounjaro  well.  She denies numbness and tingling to feet.  No vision problem.  No other complaints today.  She has been taking Synthroid  200 mg daily.  Denies hypo and hyperthyroid symptoms.  REVIEW OF SYSTEMS As per history of present illness.   PAST MEDICAL HISTORY: Past Medical History:  Diagnosis Date   Complication of anesthesia    Diabetes mellitus without complication (HCC)    Hypertension    Obesity    PONV (postoperative nausea and vomiting)    Thyroid  disease    Hyperactive then was treated with iodine.     PAST SURGICAL HISTORY: Past Surgical History:  Procedure Laterality Date   BREAST REDUCTION SURGERY  [id number]   CATARACT EXTRACTION Left 09/03/2022   CATARACT EXTRACTION Right 08/27/2022   MUSCLE BIOPSY Right 07/15/2019   Procedure: MUSCLE BIOPSY  RIGHT DELTOID AND RIGHT RECTUS FEMORIS MUSCLE;  Surgeon: Tanda Locus, MD;  Location: [facility] OR;  Service: General;  Laterality: Right;   REDUCTION MAMMAPLASTY Bilateral    Patient states 20 years ago    ALLERGIES: Allergies  Allergen Reactions   Atorvastatin Other (See Comments)    Other reaction(s): Other (See Comments) Muscle spasm     FAMILY HISTORY:  Family History  Problem Relation Age of Onset   Stroke Mother    Diabetes Mother    Diabetes Father    Diabetes Sister    Hypertension Sister    Hypertension Maternal Aunt     Cancer Maternal Uncle    Diabetes Paternal Aunt    Diabetes Paternal Uncle    Cancer Maternal Grandmother    Breast cancer Neg Hx     SOCIAL HISTORY: Social History   Socioeconomic History   Marital status: Married    Spouse name: Not on file   Number of children: Not on file   Years of education: Not on file   Highest education level: Not on file  Occupational History   Not on file  Tobacco Use   Smoking status: Never   Smokeless tobacco: Never  Vaping Use   Vaping status: Never Used  Substance and Sexual Activity   Alcohol use: Yes    Comment: Once oer year around holidays   Drug use: No   Sexual activity: Yes  Other Topics Concern   Not on file  Social History Narrative   Marital Status:  Married Press photographer)    Children:  G2 Daughter (52) Recruitment consultant) Twins (Adam and Antiyonne)   Pets: Cat (01)    Living Situation: Lives with husband    Occupation:  School Principal -  Presenter, broadcasting    Education:  Best boy in Education    Tobacco Use/Exposure:  None    Alcohol Use:  Occasional   Drug Use:  None   Diet:  Regular   Exercise:  None   Hobbies: Reading          Social Drivers of Corporate investment banker Strain: Not on file  Food Insecurity: Low Risk  (11/30/2023)   Received from [facility]   Hunger Vital Sign    Within the past 12 months, you worried that your food would run out before you got money to buy more: Never true    Within the past 12 months, the food you bought just didn't last and you didn't have money to get more. : Never true  Transportation Needs: No Transportation Needs (11/30/2023)   Received from [facility]    In the past 12 months, has lack of reliable transportation kept you from medical appointments, meetings, work or from getting things needed for daily living? : No  Physical Activity: Not on file  Stress: Not on file  Social Connections: Not on file    MEDICATIONS:  Current Outpatient Medications  Medication Sig  Dispense Refill   acetaminophen  (TYLENOL ) 500 MG tablet Take 500 mg by mouth every 6 (six) hours as needed for mild pain or headache.     azaTHIOprine (IMURAN) 50 MG tablet Take by mouth.     calcium  carbonate (OS-CAL - DOSED IN MG OF ELEMENTAL CALCIUM ) 1250 (500 Ca) MG tablet Take 1 tablet (500 mg of elemental calcium  total) by mouth daily with breakfast. 30 tablet 0   cholecalciferol  (VITAMIN D3) 25 MCG (1000 UNIT) tablet Take 1,000 Units by mouth daily.  Dietary Management Product (RHEUMATE PO) Take by mouth.     ezetimibe  (ZETIA ) 10 MG tablet Take 1 tablet (10 mg total) by mouth daily. 90 tablet 3   folic acid (FOLVITE) 1 MG tablet Take 1 mg by mouth 3 (three) times daily.     GEMTESA 75 MG TABS Take 75 mg by mouth daily.     hydrochlorothiazide  (HYDRODIURIL ) 25 MG tablet Take 25 mg by mouth daily.      losartan (COZAAR) 25 MG tablet Take 25 mg by mouth daily.     methotrexate 250 MG/10ML injection Inject into the skin.     mirabegron  ER (MYRBETRIQ ) 50 MG TB24 tablet Take 50 mg by mouth daily.     OneTouch Delica Lancets 33G MISC Use as directed to check blood sugar 50 each 5   ONETOUCH VERIO test strip USE TO CHECK BLOOD SUGAR 2 TIMES A DAY 100 strip 3   riTUXimab 1,000 mg in sodium chloride  0.9 % Inject 1,000 mg into the vein every 6 (six) months. Patient receives in fusion twice a year     SYNTHROID  200 MCG tablet Take 1 tablet (200 mcg total) by mouth daily before breakfast. 30 tablet 2   tirzepatide  (MOUNJARO ) 15 MG/0.5ML Pen Inject 15 mg into the skin once a week. 6 mL 4   Colesevelam  HCl (WELCHOL ) 3.75 g PACK 1 pack daily 90 each 3   No current facility-administered medications for this visit.    PHYSICAL EXAM: Vitals:   02/15/24 1002  BP: 116/70  Pulse: 71  Resp: 20  SpO2: 99%  Weight: 198 lb 6.4 oz (90 kg)  Height: 5' 6 (1.676 m)     Body mass index is 32.02 kg/m.  Wt Readings from Last 3 Encounters:  02/15/24 198 lb 6.4 oz (90 kg)  10/14/23 210 lb 6.4 oz (95.4  kg)  09/22/23 216 lb 11.2 oz (98.3 kg)    General: Well developed, well nourished female in no apparent distress.  HEENT: AT/[place], no external lesions.  Eyes: Conjunctiva clear and no icterus. Neck: Neck supple  Lungs: Respirations not labored Neurologic: Alert, oriented, normal speech Extremities / Skin: Dry.   Psychiatric: Does not appear depressed or anxious  Diabetic Foot Exam - Simple   No data filed     LABS Reviewed Lab Results  Component Value Date   HGBA1C 5.3 02/15/2024   HGBA1C 5.7 (H) 10/08/2023   HGBA1C 5.4 07/15/2023   Lab Results  Component Value Date   FRUCTOSAMINE 234 01/03/2020   FRUCTOSAMINE 262 09/15/2019   Lab Results  Component Value Date   CHOL 203 (H) 10/08/2023   HDL 77 10/08/2023   LDLCALC 111 (H) 10/08/2023   LDLDIRECT 109.0 01/07/2022   TRIG 64 10/08/2023   CHOLHDL 2.6 10/08/2023   Lab Results  Component Value Date   MICRALBCREAT 3 07/15/2023   Lab Results  Component Value Date   CREATININE 0.81 10/08/2023   Lab Results  Component Value Date   GFR 77.20 04/10/2023    ASSESSMENT / PLAN  1. Type 2 diabetes mellitus without complication, without long-term current use of insulin  (HCC)   2. Hypothyroidism, postradioiodine therapy   3. Hypercholesterolemia       Diabetes Mellitus type 2, complicated by no known complications. - Diabetic status / severity: Controlled.  Lab Results  Component Value Date   HGBA1C 5.3 02/15/2024    - Hemoglobin A1c goal : <6.5%  Patient has controlled diabetes mellitus  - Medications: See below.  I)  continue Mounjaro  15 mg weekly.    - Home glucose testing: In the morning fasting few times a week.  - Discussed/ Gave Hypoglycemia treatment plan.  # Consult : not required at this time.   # Annual urine for microalbuminuria/ creatinine ratio, no microalbuminuria currently, continue ACE/ARB /lisinopril . Last  Lab Results  Component Value Date   MICRALBCREAT 3 07/15/2023    # Foot  check nightly.  # Annual dilated diabetic eye exams.   - Diet: Make healthy diabetic food choices.  - Life style / activity / exercise: Discussed.  2. Blood pressure  -  BP Readings from Last 1 Encounters:  02/15/24 116/70    - Control is in target.  - No change in current plans.  3. Lipid status / Hyperlipidemia - Last  Lab Results  Component Value Date   LDLCALC 111 (H) 10/08/2023   - Continue on Zetia  and Colesevelam .  History of myositis ?  Related with atorvastatin. - Acceptable LDL.  # Postablative hypothyroidism -Continue current dose of Synthroid  200 mcg daily.    Diagnoses and all orders for this visit:  Type 2 diabetes mellitus without complication, without long-term current use of insulin  (HCC) -     POCT glycosylated hemoglobin (Hb A1C) -     Colesevelam  HCl (WELCHOL ) 3.75 g PACK; 1 pack daily  Hypothyroidism, postradioiodine therapy  Hypercholesterolemia     DISPOSITION Follow up in clinic in 4  months suggested.    All questions answered and patient verbalized understanding of the plan.  [place] Shamarcus Hoheisel, MD [facility] Endocrinology [facility] [address] [place], [address] [place], [place] [address] Phone # [phone]  At least part of this note was generated using voice recognition software. Inadvertent word errors may have occurred, which were not recognized during the proofreading process.

## 2024-06-02 ENCOUNTER — Other Ambulatory Visit: Payer: Self-pay | Admitting: Endocrinology

## 2024-06-02 DIAGNOSIS — E7800 Pure hypercholesterolemia, unspecified: Secondary | ICD-10-CM

## 2024-06-16 ENCOUNTER — Encounter: Payer: Self-pay | Admitting: Endocrinology

## 2024-06-16 ENCOUNTER — Other Ambulatory Visit

## 2024-06-16 ENCOUNTER — Ambulatory Visit: Admitting: Endocrinology

## 2024-06-16 ENCOUNTER — Other Ambulatory Visit: Payer: Self-pay | Admitting: Endocrinology

## 2024-06-16 ENCOUNTER — Ambulatory Visit: Payer: Self-pay | Admitting: Endocrinology

## 2024-06-16 VITALS — BP 112/56 | HR 84 | Ht 67.0 in | Wt 201.4 lb

## 2024-06-16 DIAGNOSIS — E119 Type 2 diabetes mellitus without complications: Secondary | ICD-10-CM

## 2024-06-16 DIAGNOSIS — E890 Postprocedural hypothyroidism: Secondary | ICD-10-CM

## 2024-06-16 DIAGNOSIS — E669 Obesity, unspecified: Secondary | ICD-10-CM

## 2024-06-16 DIAGNOSIS — E7800 Pure hypercholesterolemia, unspecified: Secondary | ICD-10-CM

## 2024-06-16 LAB — POCT GLYCOSYLATED HEMOGLOBIN (HGB A1C): Hemoglobin A1C: 5.6 % (ref 4.0–5.6)

## 2024-06-16 MED ORDER — LANCETS MISC: 1.0000 | 3 refills | Status: AC

## 2024-06-16 MED ORDER — LANCET DEVICE MISC: 1.0000 | Freq: Every day | 0 refills | Status: DC

## 2024-06-16 MED ORDER — ONETOUCH VERIO VI STRP: ORAL_STRIP | 3 refills | Status: DC

## 2024-06-16 MED ORDER — ACCU-CHEK GUIDE TEST VI STRP: ORAL_STRIP | 12 refills | Status: DC

## 2024-06-16 MED ORDER — TIRZEPATIDE 15 MG/0.5ML ~~LOC~~ SOAJ: 15.0000 mg | SUBCUTANEOUS | 4 refills | Status: AC

## 2024-06-16 MED ORDER — ACCU-CHEK GUIDE W/DEVICE KIT: PACK | 0 refills | Status: AC

## 2024-06-16 MED ORDER — LANCET DEVICE MISC: 0 refills | Status: AC

## 2024-06-16 MED ORDER — ACCU-CHEK SOFTCLIX LANCETS MISC: 3 refills | Status: AC

## 2024-06-16 NOTE — Progress Notes (Signed)
 "  Outpatient Endocrinology Note Andrea Gallentine, MD  06/16/24  Patient's Name: Andrea Delacruz    DOB: 22-Jun-1960    MRN: [id number]                                                    REASON OF VISIT: Follow up for type 2 diabetes mellitus / hypothyroidism  PCP: Dayna Motto, DO  HISTORY OF PRESENT ILLNESS:   Andrea Delacruz is a 64 y.o. old female with past medical history listed below, is here for follow up for type 2 diabetes mellitus /postablative hypothyroidism.    Pertinent Diabetes History: Patient was previously seen by Dr. Von and was last time seen in July 2024.  Patient was diagnosed with type 2 diabetes mellitus in 2018.  No personal history of pancreatitis and / or family history of medullary thyroid  carcinoma or MEN 2B syndrome.   Chronic Diabetes Complications : Retinopathy: no. Last ophthalmology exam was done on 04/02/2023, annually, following with ophthalmology regularly.  Nephropathy: no, on ACE/ARB /lisinopril . Peripheral neuropathy: no Coronary artery disease: no Stroke: no  Relevant comorbidities and cardiovascular risk factors: Obesity: yes Body mass index is 31.54 kg/m.  Hypertension: Yes  Hyperlipidemia : Yes, not on statin.  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  LIPIDS: LDL has been consistently over 100 previously and generally around 140. Nexlizet  was denied by insurance.  She is taking Zetia  WelChol .  Current / Home Diabetic regimen includes:  Mounjaro  15 mg weekly.  Prior diabetic medications: Metformin  stopped due to diarrhea with high dose.  Glycemic data:   One Touch Verio glucometer data reviewed, checking blood sugar occasionally, so low blood sugar 96, 74 fasting.  Hypoglycemia: Patient has no hypoglycemic episodes. Patient has hypoglycemia awareness.  Factors modifying glucose control: 1.  Diabetic diet assessment: 3 meals a  day.  2.  Staying active or exercising:   3.  Medication compliance: compliant all of the time.  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 Since about 08/2019 she was taking Tirosint  up to , was not controlled with Synthroid   previously. Subsequently she is on Synthroid , brand name.  Dose was adjusted periodically in the past.  Lately taking Synthroid  200 mcg daily.  Interval history  Hemoglobin A1c 5.6%.  She has been tolerating Mounjaro  well.  She lost about 10 pounds of weight in last 6 months.  She has been taking Synthroid  200 mcg daily, denies hypo and hyperthyroid symptoms, no palpitation.  Denies numbness and ting of the feet.  No vision problem.  No other complaints today.   REVIEW OF SYSTEMS As per history of present illness.   PAST MEDICAL HISTORY: Past Medical History:  Diagnosis Date   Complication of anesthesia    Diabetes mellitus without complication (HCC)    Hypertension    Obesity    PONV (postoperative nausea and vomiting)    Thyroid  disease    Hyperactive then was treated with iodine.     PAST SURGICAL HISTORY: Past Surgical History:  Procedure Laterality Date   BREAST REDUCTION SURGERY  [id number]   CATARACT EXTRACTION Left 09/03/2022   CATARACT EXTRACTION Right 08/27/2022   MUSCLE BIOPSY Right 07/15/2019   Procedure: MUSCLE BIOPSY RIGHT DELTOID AND RIGHT RECTUS FEMORIS MUSCLE;  Surgeon: Tanda Locus,  MD;  Location: [facility] OR;  Service: General;  Laterality: Right;   REDUCTION MAMMAPLASTY Bilateral    Patient states 20 years ago    ALLERGIES: Allergies  Allergen Reactions   Atorvastatin Other (See Comments)    Other reaction(s): Other (See Comments) Muscle spasm     FAMILY HISTORY:  Family History  Problem Relation Age of Onset   Stroke Mother    Diabetes Mother    Diabetes Father    Diabetes Sister    Hypertension Sister    Hypertension Maternal Aunt    Cancer Maternal Uncle    Diabetes Paternal Aunt     Diabetes Paternal Uncle    Cancer Maternal Grandmother    Breast cancer Neg Hx     SOCIAL HISTORY: Social History   Socioeconomic History   Marital status: Married    Spouse name: Not on file   Number of children: Not on file   Years of education: Not on file   Highest education level: Not on file  Occupational History   Not on file  Tobacco Use   Smoking status: Never   Smokeless tobacco: Never  Vaping Use   Vaping status: Never Used  Substance and Sexual Activity   Alcohol use: Yes    Comment: Once oer year around holidays   Drug use: No   Sexual activity: Yes  Other Topics Concern   Not on file  Social History Narrative   Marital Status:  Married Press Photographer)    Children:  G2 Daughter (6) Recruitment Consultant) Twins (Adam and Antiyonne)   Pets: Cat (01)    Living Situation: Lives with husband    Occupation:  School Principal -  Presenter, [employer]    Education:  [employer] in Education    Tobacco Use/Exposure:  None    Alcohol Use:  Occasional   Drug Use:  None   Diet:  Regular   Exercise:  None   Hobbies: Reading          Social Drivers of Health   Tobacco Use: Low Risk (06/16/2024)   Patient History    Smoking Tobacco Use: Never    Smokeless Tobacco Use: Never    Passive Exposure: Not on file  Financial Resource Strain: Not on file  Food Insecurity: Low Risk (11/30/2023)   Received from [facility]   Epic    Within the past 12 months, you worried that your food would run out before you got money to buy more: Never true    Within the past 12 months, the food you bought just didn't last and you didn't have money to get more. : Never true  Transportation Needs: No Transportation Needs (11/30/2023)   Received from [facility]    In the past 12 months, has lack of reliable transportation kept you from medical appointments, meetings, work or from getting things needed for daily living? : No  Physical Activity: Not on file  Stress: Not on file  Social  Connections: Not on file  Depression (EYV7-0): Not on file  Alcohol Screen: Not on file  Housing: Low Risk (11/30/2023)   Received from [facility]   Epic    What is your living situation today?: I have a steady place to live    Think about the place you live. Do you have problems with any of the following? Choose all that apply:: None/None on this list  Utilities: Low Risk (11/30/2023)   Received from [facility]   Utilities  In the past 12 months has the electric, gas, oil, or water  company threatened to shut off services in your home? : No  Health Literacy: Not on file    MEDICATIONS:  Current Outpatient Medications  Medication Sig Dispense Refill   ezetimibe  (ZETIA ) 10 MG tablet TAKE 1 TABLET BY MOUTH DAILY 90 tablet 3   Lancet Device MISC 1 each by Does not apply route daily. May substitute to any manufacturer covered by patient's insurance. 1 each 0   Lancets MISC 1 each by Does not apply route as directed. Dispense based on patient and insurance preference. Use up to four times daily as directed. (FOR ICD-10 E10.9, E11.9). 100 each 3   SYNTHROID  200 MCG tablet Take 1 tablet (200 mcg total) by mouth daily before breakfast. 30 tablet 2   acetaminophen  (TYLENOL ) 500 MG tablet Take 500 mg by mouth every 6 (six) hours as needed for mild pain or headache.     azaTHIOprine (IMURAN) 50 MG tablet Take by mouth.     calcium  carbonate (OS-CAL - DOSED IN MG OF ELEMENTAL CALCIUM ) 1250 (500 Ca) MG tablet Take 1 tablet (500 mg of elemental calcium  total) by mouth daily with breakfast. 30 tablet 0   cholecalciferol  (VITAMIN D3) 25 MCG (1000 UNIT) tablet Take 1,000 Units by mouth daily.     Colesevelam  HCl (WELCHOL ) 3.75 g PACK 1 pack daily 90 each 3   Dietary Management Product (RHEUMATE PO) Take by mouth.     folic acid (FOLVITE) 1 MG tablet Take 1 mg by mouth 3 (three) times daily.     GEMTESA 75 MG TABS Take 75 mg by mouth daily.     hydrochlorothiazide  (HYDRODIURIL ) 25 MG tablet Take 25  mg by mouth daily.      losartan (COZAAR) 25 MG tablet Take 25 mg by mouth daily.     methotrexate 250 MG/10ML injection Inject into the skin.     mirabegron  ER (MYRBETRIQ ) 50 MG TB24 tablet Take 50 mg by mouth daily.     ONETOUCH VERIO test strip USE TO CHECK BLOOD SUGAR 2 TIMES A DAY 100 strip 3   riTUXimab 1,000 mg in sodium chloride  0.9 % Inject 1,000 mg into the vein every 6 (six) months. Patient receives in fusion twice a year     tirzepatide  (MOUNJARO ) 15 MG/0.5ML Pen Inject 15 mg into the skin once a week. 6 mL 4   No current facility-administered medications for this visit.    PHYSICAL EXAM: Vitals:   06/16/24 1022  BP: (!) 112/56  Pulse: 84  SpO2: 99%  Weight: 201 lb 6.4 oz (91.4 kg)  Height: 5' 7 (1.702 m)     Body mass index is 31.54 kg/m.  Wt Readings from Last 3 Encounters:  06/16/24 201 lb 6.4 oz (91.4 kg)  02/15/24 198 lb 6.4 oz (90 kg)  10/14/23 210 lb 6.4 oz (95.4 kg)    General: Well developed, well nourished female in no apparent distress.  HEENT: AT/[place], no external lesions.  Eyes: Conjunctiva clear and no icterus. Neck: Neck supple  Lungs: Respirations not labored Neurologic: Alert, oriented, normal speech Extremities / Skin: Dry.   Psychiatric: Does not appear depressed or anxious  Diabetic Foot Exam - Simple   No data filed     LABS Reviewed Lab Results  Component Value Date   HGBA1C 5.6 06/16/2024   HGBA1C 5.3 02/15/2024   HGBA1C 5.7 (H) 10/08/2023   Lab Results  Component Value Date   FRUCTOSAMINE 234  01/03/2020   FRUCTOSAMINE 262 09/15/2019   Lab Results  Component Value Date   CHOL 203 (H) 10/08/2023   HDL 77 10/08/2023   LDLCALC 111 (H) 10/08/2023   LDLDIRECT 109.0 01/07/2022   TRIG 64 10/08/2023   CHOLHDL 2.6 10/08/2023   Lab Results  Component Value Date   MICRALBCREAT 3 07/15/2023   Lab Results  Component Value Date   CREATININE 0.81 10/08/2023   Lab Results  Component Value Date   GFR 77.20 04/10/2023     ASSESSMENT / PLAN  1. Type 2 diabetes mellitus without complication, without long-term current use of insulin  (HCC)   2. Hypothyroidism, postradioiodine therapy   3. Hypercholesterolemia   4. Obesity (BMI 30-39.9)     Diabetes Mellitus type 2, complicated by no known complications. - Diabetic status / severity: Controlled.  Lab Results  Component Value Date   HGBA1C 5.6 06/16/2024    - Hemoglobin A1c goal : <6.5%  Patient has controlled diabetes mellitus.  Will maintain on Mounjaro  to have optimal diabetes control and weight loss.  - Medications: See below.  I) continue Mounjaro  15 mg weekly.    - Home glucose testing: In the morning fasting few times a week.  - Discussed/ Gave Hypoglycemia treatment plan.  # Consult : not required at this time.   # Annual urine for microalbuminuria/ creatinine ratio, no microalbuminuria currently, continue ACE/ARB /lisinopril . Last  Lab Results  Component Value Date   MICRALBCREAT 3 07/15/2023    # Foot check nightly.  # Annual dilated diabetic eye exams.   - Diet: Make healthy diabetic food choices.  - Life style / activity / exercise: Discussed.  2. Blood pressure  -  BP Readings from Last 1 Encounters:  06/16/24 (!) 112/56    - Control is in target.  - No change in current plans.  3. Lipid status / Hyperlipidemia - Last  Lab Results  Component Value Date   LDLCALC 111 (H) 10/08/2023   - Continue on Zetia  and Colesevelam .  History of myositis ?  Related with atorvastatin. - Acceptable LDL. - Check lipid panel today.  # Postablative hypothyroidism -Current dose of Synthroid  200 mcg daily.   -Check thyroid  function test today.  Andrea Delacruz was seen today for diabetes.  Diagnoses and all orders for this visit:  Type 2 diabetes mellitus without complication, without long-term current use of insulin  (HCC) -     POCT HgB A1C -     tirzepatide  (MOUNJARO ) 15 MG/0.5ML Pen; Inject 15 mg into the skin once a  week. -     ONETOUCH VERIO test strip; USE TO CHECK BLOOD SUGAR 2 TIMES A DAY -     Lancet Device MISC; 1 each by Does not apply route daily. May substitute to any manufacturer covered by patient's insurance. -     Lancets MISC; 1 each by Does not apply route as directed. Dispense based on patient and insurance preference. Use up to four times daily as directed. (FOR ICD-10 E10.9, E11.9). -     Lipid panel -     Microalbumin / creatinine urine ratio -     Basic metabolic panel with GFR  Hypothyroidism, postradioiodine therapy -     T4, free -     TSH  Hypercholesterolemia -     Lipid panel  Obesity (BMI 30-39.9)     DISPOSITION Follow up in clinic in 4  months suggested.  Labs today as ordered.   All questions answered and patient verbalized  understanding of the plan.  Davidmichael Zarazua, MD [facility] [address] [place], [address] [place], [place] [address] Phone # [phone]  At least part of this note was generated using voice recognition software. Inadvertent word errors may have occurred, which were not recognized during the proofreading process. "

## 2024-06-17 ENCOUNTER — Encounter: Payer: Self-pay | Admitting: Endocrinology

## 2024-06-17 DIAGNOSIS — E890 Postprocedural hypothyroidism: Secondary | ICD-10-CM

## 2024-06-17 LAB — LIPID PANEL
Cholesterol: 209 mg/dL — ABNORMAL HIGH
HDL: 75 mg/dL
LDL Cholesterol (Calc): 120 mg/dL — ABNORMAL HIGH
Non-HDL Cholesterol (Calc): 134 mg/dL — ABNORMAL HIGH
Total CHOL/HDL Ratio: 2.8 (calc)
Triglycerides: 52 mg/dL

## 2024-06-17 LAB — BASIC METABOLIC PANEL WITH GFR
BUN: 12 mg/dL (ref 7–25)
CO2: 31 mmol/L (ref 20–32)
Calcium: 10.3 mg/dL (ref 8.6–10.4)
Chloride: 102 mmol/L (ref 98–110)
Creat: 0.78 mg/dL (ref 0.50–1.05)
Glucose, Bld: 77 mg/dL (ref 65–139)
Potassium: 4 mmol/L (ref 3.5–5.3)
Sodium: 139 mmol/L (ref 135–146)
eGFR: 85 mL/min/{1.73_m2}

## 2024-06-17 LAB — MICROALBUMIN / CREATININE URINE RATIO
Creatinine, Urine: 102 mg/dL (ref 20–275)
Microalb Creat Ratio: 7 mg/g{creat}
Microalb, Ur: 0.7 mg/dL

## 2024-06-17 LAB — TSH: TSH: 4.57 m[IU]/L — ABNORMAL HIGH (ref 0.40–4.50)

## 2024-06-17 LAB — T4, FREE: Free T4: 1.4 ng/dL (ref 0.8–1.8)

## 2024-06-21 NOTE — Telephone Encounter (Signed)
 Yes, arrange for lab visit in 30 days for TSH, free T4, order placed.

## 2024-10-11 ENCOUNTER — Ambulatory Visit: Admitting: Endocrinology
